# Patient Record
Sex: Female | Born: 1935 | Race: White | Hispanic: No | Smoking: Former smoker
Health system: Southern US, Community
[De-identification: ages and names within clinical notes are randomized; demographics above are authoritative.]

## PROBLEM LIST (undated history)

## (undated) DIAGNOSIS — K5792 Diverticulitis of intestine, part unspecified, without perforation or abscess without bleeding: Secondary | ICD-10-CM

## (undated) DIAGNOSIS — F32A Depression, unspecified: Secondary | ICD-10-CM

## (undated) DIAGNOSIS — K589 Irritable bowel syndrome without diarrhea: Secondary | ICD-10-CM

## (undated) DIAGNOSIS — K6389 Other specified diseases of intestine: Secondary | ICD-10-CM

## (undated) DIAGNOSIS — N83209 Unspecified ovarian cyst, unspecified side: Secondary | ICD-10-CM

## (undated) DIAGNOSIS — F419 Anxiety disorder, unspecified: Secondary | ICD-10-CM

## (undated) DIAGNOSIS — G4700 Insomnia, unspecified: Secondary | ICD-10-CM

## (undated) DIAGNOSIS — K449 Diaphragmatic hernia without obstruction or gangrene: Secondary | ICD-10-CM

## (undated) DIAGNOSIS — Z8673 Personal history of transient ischemic attack (TIA), and cerebral infarction without residual deficits: Secondary | ICD-10-CM

## (undated) DIAGNOSIS — C50919 Malignant neoplasm of unspecified site of unspecified female breast: Secondary | ICD-10-CM

## (undated) DIAGNOSIS — F329 Major depressive disorder, single episode, unspecified: Secondary | ICD-10-CM

## (undated) DIAGNOSIS — M21619 Bunion of unspecified foot: Secondary | ICD-10-CM

## (undated) DIAGNOSIS — I10 Essential (primary) hypertension: Secondary | ICD-10-CM

## (undated) DIAGNOSIS — K219 Gastro-esophageal reflux disease without esophagitis: Secondary | ICD-10-CM

## (undated) DIAGNOSIS — K635 Polyp of colon: Secondary | ICD-10-CM

## (undated) HISTORY — DX: Polyp of colon: K63.5

## (undated) HISTORY — PX: BUNIONECTOMY: SHX129

## (undated) HISTORY — DX: Depression, unspecified: F32.A

## (undated) HISTORY — PX: CATARACT EXTRACTION: SUR2

## (undated) HISTORY — DX: Irritable bowel syndrome, unspecified: K58.9

## (undated) HISTORY — DX: Anxiety disorder, unspecified: F41.9

## (undated) HISTORY — DX: Bunion of unspecified foot: M21.619

## (undated) HISTORY — DX: Gastro-esophageal reflux disease without esophagitis: K21.9

## (undated) HISTORY — DX: Insomnia, unspecified: G47.00

## (undated) HISTORY — DX: Diaphragmatic hernia without obstruction or gangrene: K44.9

## (undated) HISTORY — DX: Unspecified ovarian cyst, unspecified side: N83.209

## (undated) HISTORY — DX: Diverticulitis of intestine, part unspecified, without perforation or abscess without bleeding: K57.92

## (undated) HISTORY — PX: BREAST BIOPSY: SHX20

## (undated) HISTORY — DX: Essential (primary) hypertension: I10

## (undated) HISTORY — PX: ABDOMINAL HYSTERECTOMY: SHX81

## (undated) HISTORY — PX: TONSILLECTOMY: SUR1361

## (undated) HISTORY — DX: Malignant neoplasm of unspecified site of unspecified female breast: C50.919

## (undated) HISTORY — PX: KNEE ARTHROPLASTY: SHX992

## (undated) HISTORY — PX: HIP ARTHROPLASTY: SHX981

## (undated) HISTORY — DX: Other specified diseases of intestine: K63.89

## (undated) HISTORY — DX: Major depressive disorder, single episode, unspecified: F32.9

---

## 1995-06-21 HISTORY — PX: MASTECTOMY: SHX3

## 1996-11-14 ENCOUNTER — Encounter: Payer: Self-pay | Admitting: Gastroenterology

## 1998-04-10 ENCOUNTER — Other Ambulatory Visit: Admission: RE | Admit: 1998-04-10 | Discharge: 1998-04-10 | Payer: Self-pay | Admitting: *Deleted

## 1998-05-12 ENCOUNTER — Ambulatory Visit (HOSPITAL_COMMUNITY): Admission: RE | Admit: 1998-05-12 | Discharge: 1998-05-12 | Payer: Self-pay | Admitting: *Deleted

## 1999-04-13 ENCOUNTER — Other Ambulatory Visit: Admission: RE | Admit: 1999-04-13 | Discharge: 1999-04-13 | Payer: Self-pay | Admitting: *Deleted

## 1999-05-17 ENCOUNTER — Ambulatory Visit (HOSPITAL_COMMUNITY): Admission: RE | Admit: 1999-05-17 | Discharge: 1999-05-17 | Payer: Self-pay | Admitting: *Deleted

## 1999-05-17 ENCOUNTER — Encounter: Payer: Self-pay | Admitting: *Deleted

## 2000-04-17 ENCOUNTER — Other Ambulatory Visit: Admission: RE | Admit: 2000-04-17 | Discharge: 2000-04-17 | Payer: Self-pay | Admitting: *Deleted

## 2000-05-17 ENCOUNTER — Encounter: Payer: Self-pay | Admitting: *Deleted

## 2000-05-17 ENCOUNTER — Ambulatory Visit (HOSPITAL_COMMUNITY): Admission: RE | Admit: 2000-05-17 | Discharge: 2000-05-17 | Payer: Self-pay | Admitting: *Deleted

## 2000-07-25 ENCOUNTER — Encounter: Payer: Self-pay | Admitting: Emergency Medicine

## 2000-07-25 ENCOUNTER — Inpatient Hospital Stay (HOSPITAL_COMMUNITY): Admission: EM | Admit: 2000-07-25 | Discharge: 2000-07-27 | Payer: Self-pay | Admitting: Emergency Medicine

## 2000-07-26 ENCOUNTER — Encounter: Payer: Self-pay | Admitting: Neurology

## 2001-04-18 ENCOUNTER — Other Ambulatory Visit: Admission: RE | Admit: 2001-04-18 | Discharge: 2001-04-18 | Payer: Self-pay | Admitting: *Deleted

## 2001-04-19 ENCOUNTER — Encounter: Admission: RE | Admit: 2001-04-19 | Discharge: 2001-04-19 | Payer: Self-pay | Admitting: Gastroenterology

## 2001-04-19 ENCOUNTER — Encounter: Payer: Self-pay | Admitting: Gastroenterology

## 2001-05-01 ENCOUNTER — Ambulatory Visit (HOSPITAL_COMMUNITY): Admission: RE | Admit: 2001-05-01 | Discharge: 2001-05-01 | Payer: Self-pay | Admitting: Gastroenterology

## 2001-05-01 ENCOUNTER — Encounter (INDEPENDENT_AMBULATORY_CARE_PROVIDER_SITE_OTHER): Payer: Self-pay | Admitting: Specialist

## 2001-05-01 ENCOUNTER — Encounter: Payer: Self-pay | Admitting: Gastroenterology

## 2001-05-21 ENCOUNTER — Encounter: Payer: Self-pay | Admitting: Gastroenterology

## 2001-05-21 ENCOUNTER — Ambulatory Visit (HOSPITAL_COMMUNITY): Admission: RE | Admit: 2001-05-21 | Discharge: 2001-05-21 | Payer: Self-pay | Admitting: Gastroenterology

## 2001-05-31 ENCOUNTER — Ambulatory Visit (HOSPITAL_COMMUNITY): Admission: RE | Admit: 2001-05-31 | Discharge: 2001-05-31 | Payer: Self-pay | Admitting: Internal Medicine

## 2001-05-31 ENCOUNTER — Encounter: Payer: Self-pay | Admitting: Obstetrics and Gynecology

## 2002-06-03 ENCOUNTER — Ambulatory Visit (HOSPITAL_COMMUNITY): Admission: RE | Admit: 2002-06-03 | Discharge: 2002-06-03 | Payer: Self-pay | Admitting: *Deleted

## 2002-10-08 ENCOUNTER — Encounter: Admission: RE | Admit: 2002-10-08 | Discharge: 2002-10-08 | Payer: Self-pay | Admitting: Urology

## 2002-10-08 ENCOUNTER — Encounter: Payer: Self-pay | Admitting: Urology

## 2002-10-09 ENCOUNTER — Ambulatory Visit (HOSPITAL_BASED_OUTPATIENT_CLINIC_OR_DEPARTMENT_OTHER): Admission: RE | Admit: 2002-10-09 | Discharge: 2002-10-09 | Payer: Self-pay | Admitting: Urology

## 2003-04-01 ENCOUNTER — Ambulatory Visit (HOSPITAL_COMMUNITY): Admission: RE | Admit: 2003-04-01 | Discharge: 2003-04-01 | Payer: Self-pay | Admitting: Internal Medicine

## 2003-04-01 ENCOUNTER — Encounter: Payer: Self-pay | Admitting: Internal Medicine

## 2003-04-02 ENCOUNTER — Encounter (INDEPENDENT_AMBULATORY_CARE_PROVIDER_SITE_OTHER): Payer: Self-pay | Admitting: Specialist

## 2003-04-02 ENCOUNTER — Ambulatory Visit (HOSPITAL_BASED_OUTPATIENT_CLINIC_OR_DEPARTMENT_OTHER): Admission: RE | Admit: 2003-04-02 | Discharge: 2003-04-02 | Payer: Self-pay | Admitting: Orthopedic Surgery

## 2003-04-02 ENCOUNTER — Ambulatory Visit (HOSPITAL_COMMUNITY): Admission: RE | Admit: 2003-04-02 | Discharge: 2003-04-02 | Payer: Self-pay | Admitting: Orthopedic Surgery

## 2003-06-05 ENCOUNTER — Ambulatory Visit (HOSPITAL_COMMUNITY): Admission: RE | Admit: 2003-06-05 | Discharge: 2003-06-05 | Payer: Self-pay | Admitting: *Deleted

## 2003-06-10 ENCOUNTER — Other Ambulatory Visit: Admission: RE | Admit: 2003-06-10 | Discharge: 2003-06-10 | Payer: Self-pay | Admitting: *Deleted

## 2003-07-29 ENCOUNTER — Ambulatory Visit (HOSPITAL_COMMUNITY): Admission: RE | Admit: 2003-07-29 | Discharge: 2003-07-29 | Payer: Self-pay | Admitting: Internal Medicine

## 2003-11-19 ENCOUNTER — Ambulatory Visit (HOSPITAL_COMMUNITY): Admission: RE | Admit: 2003-11-19 | Discharge: 2003-11-19 | Payer: Self-pay | Admitting: Internal Medicine

## 2004-06-07 ENCOUNTER — Ambulatory Visit (HOSPITAL_COMMUNITY): Admission: RE | Admit: 2004-06-07 | Discharge: 2004-06-07 | Payer: Self-pay | Admitting: General Surgery

## 2004-06-15 ENCOUNTER — Encounter: Admission: RE | Admit: 2004-06-15 | Discharge: 2004-06-15 | Payer: Self-pay | Admitting: Orthopedic Surgery

## 2004-06-30 ENCOUNTER — Ambulatory Visit (HOSPITAL_BASED_OUTPATIENT_CLINIC_OR_DEPARTMENT_OTHER): Admission: RE | Admit: 2004-06-30 | Discharge: 2004-06-30 | Payer: Self-pay | Admitting: Orthopedic Surgery

## 2004-12-27 ENCOUNTER — Emergency Department (HOSPITAL_COMMUNITY): Admission: EM | Admit: 2004-12-27 | Discharge: 2004-12-28 | Payer: Self-pay | Admitting: Emergency Medicine

## 2005-01-31 ENCOUNTER — Encounter: Admission: RE | Admit: 2005-01-31 | Discharge: 2005-01-31 | Payer: Self-pay | Admitting: Neurology

## 2005-01-31 ENCOUNTER — Ambulatory Visit (HOSPITAL_COMMUNITY): Admission: RE | Admit: 2005-01-31 | Discharge: 2005-01-31 | Payer: Self-pay | Admitting: Neurology

## 2005-06-08 ENCOUNTER — Ambulatory Visit (HOSPITAL_COMMUNITY): Admission: RE | Admit: 2005-06-08 | Discharge: 2005-06-08 | Payer: Self-pay | Admitting: *Deleted

## 2006-04-25 ENCOUNTER — Ambulatory Visit: Payer: Self-pay | Admitting: Gastroenterology

## 2006-05-02 ENCOUNTER — Encounter: Admission: RE | Admit: 2006-05-02 | Discharge: 2006-05-02 | Payer: Self-pay | Admitting: Gastroenterology

## 2006-05-02 ENCOUNTER — Encounter: Payer: Self-pay | Admitting: Gastroenterology

## 2006-05-15 ENCOUNTER — Ambulatory Visit: Payer: Self-pay | Admitting: Gastroenterology

## 2006-05-22 ENCOUNTER — Ambulatory Visit: Payer: Self-pay | Admitting: Gastroenterology

## 2006-05-22 ENCOUNTER — Encounter (INDEPENDENT_AMBULATORY_CARE_PROVIDER_SITE_OTHER): Payer: Self-pay | Admitting: Specialist

## 2006-06-09 ENCOUNTER — Ambulatory Visit (HOSPITAL_COMMUNITY): Admission: RE | Admit: 2006-06-09 | Discharge: 2006-06-09 | Payer: Self-pay | Admitting: *Deleted

## 2007-05-07 ENCOUNTER — Encounter: Admission: RE | Admit: 2007-05-07 | Discharge: 2007-05-07 | Payer: Self-pay | Admitting: Obstetrics and Gynecology

## 2007-05-07 ENCOUNTER — Encounter: Admission: RE | Admit: 2007-05-07 | Discharge: 2007-05-07 | Payer: Self-pay | Admitting: Plastic Surgery

## 2007-05-09 ENCOUNTER — Encounter: Admission: RE | Admit: 2007-05-09 | Discharge: 2007-05-09 | Payer: Self-pay | Admitting: Obstetrics and Gynecology

## 2007-05-09 ENCOUNTER — Encounter (INDEPENDENT_AMBULATORY_CARE_PROVIDER_SITE_OTHER): Payer: Self-pay | Admitting: Diagnostic Radiology

## 2007-05-23 ENCOUNTER — Ambulatory Visit: Payer: Self-pay | Admitting: Oncology

## 2007-05-28 LAB — COMPREHENSIVE METABOLIC PANEL
Albumin: 3.8 g/dL (ref 3.5–5.2)
BUN: 26 mg/dL — ABNORMAL HIGH (ref 6–23)
CO2: 26 mEq/L (ref 19–32)
Calcium: 9.2 mg/dL (ref 8.4–10.5)
Chloride: 104 mEq/L (ref 96–112)
Creatinine, Ser: 0.83 mg/dL (ref 0.40–1.20)
Potassium: 4.2 mEq/L (ref 3.5–5.3)

## 2007-05-28 LAB — LACTATE DEHYDROGENASE: LDH: 190 U/L (ref 94–250)

## 2007-05-28 LAB — CBC WITH DIFFERENTIAL/PLATELET
Basophils Absolute: 0 10*3/uL (ref 0.0–0.1)
Eosinophils Absolute: 0.3 10*3/uL (ref 0.0–0.5)
HCT: 40.1 % (ref 34.8–46.6)
HGB: 14.2 g/dL (ref 11.6–15.9)
MONO#: 0.5 10*3/uL (ref 0.1–0.9)
NEUT#: 2.3 10*3/uL (ref 1.5–6.5)
NEUT%: 50.3 % (ref 39.6–76.8)
RDW: 12.4 % (ref 11.3–14.5)
WBC: 4.6 10*3/uL (ref 3.9–10.0)
lymph#: 1.5 10*3/uL (ref 0.9–3.3)

## 2007-05-31 ENCOUNTER — Encounter: Admission: RE | Admit: 2007-05-31 | Discharge: 2007-05-31 | Payer: Self-pay | Admitting: Oncology

## 2007-05-31 LAB — VITAMIN D PNL(25-HYDRXY+1,25-DIHY)-BLD
Vit D, 1,25-Dihydroxy: 56 pg/mL (ref 6–62)
Vit D, 25-Hydroxy: 58 ng/mL (ref 30–89)

## 2007-06-06 LAB — CBC WITH DIFFERENTIAL/PLATELET
BASO%: 0.4 % (ref 0.0–2.0)
Basophils Absolute: 0 10*3/uL (ref 0.0–0.1)
EOS%: 4.7 % (ref 0.0–7.0)
HCT: 40.5 % (ref 34.8–46.6)
HGB: 14.4 g/dL (ref 11.6–15.9)
MCH: 30.8 pg (ref 26.0–34.0)
MCHC: 35.4 g/dL (ref 32.0–36.0)
MCV: 87.1 fL (ref 81.0–101.0)
MONO%: 11.5 % (ref 0.0–13.0)
NEUT%: 57.2 % (ref 39.6–76.8)
RDW: 12.4 % (ref 11.3–14.5)
lymph#: 1.3 10*3/uL (ref 0.9–3.3)

## 2007-06-06 LAB — COMPREHENSIVE METABOLIC PANEL
BUN: 24 mg/dL — ABNORMAL HIGH (ref 6–23)
CO2: 29 mEq/L (ref 19–32)
Calcium: 9.4 mg/dL (ref 8.4–10.5)
Chloride: 102 mEq/L (ref 96–112)
Creatinine, Ser: 0.91 mg/dL (ref 0.40–1.20)

## 2007-06-06 LAB — LACTATE DEHYDROGENASE: LDH: 176 U/L (ref 94–250)

## 2007-06-10 LAB — VITAMIN D PNL(25-HYDRXY+1,25-DIHY)-BLD
Vit D, 1,25-Dihydroxy: 45 pg/mL (ref 6–62)
Vit D, 25-Hydroxy: 58 ng/mL (ref 30–89)

## 2007-06-11 ENCOUNTER — Ambulatory Visit: Payer: Self-pay | Admitting: Gastroenterology

## 2007-06-29 ENCOUNTER — Ambulatory Visit: Admission: RE | Admit: 2007-06-29 | Discharge: 2007-09-20 | Payer: Self-pay | Admitting: Radiation Oncology

## 2007-07-10 ENCOUNTER — Ambulatory Visit (HOSPITAL_COMMUNITY): Admission: RE | Admit: 2007-07-10 | Discharge: 2007-07-11 | Payer: Self-pay | Admitting: Otolaryngology

## 2007-07-10 ENCOUNTER — Encounter (INDEPENDENT_AMBULATORY_CARE_PROVIDER_SITE_OTHER): Payer: Self-pay | Admitting: General Surgery

## 2007-07-19 ENCOUNTER — Ambulatory Visit: Payer: Self-pay | Admitting: Oncology

## 2007-09-13 ENCOUNTER — Ambulatory Visit: Payer: Self-pay | Admitting: Oncology

## 2007-09-20 ENCOUNTER — Ambulatory Visit: Admission: RE | Admit: 2007-09-20 | Discharge: 2007-10-09 | Payer: Self-pay | Admitting: Radiation Oncology

## 2007-10-05 ENCOUNTER — Ambulatory Visit (HOSPITAL_COMMUNITY): Admission: RE | Admit: 2007-10-05 | Discharge: 2007-10-05 | Payer: Self-pay | Admitting: Plastic Surgery

## 2007-11-15 ENCOUNTER — Ambulatory Visit: Payer: Self-pay | Admitting: Oncology

## 2007-12-27 ENCOUNTER — Ambulatory Visit: Payer: Self-pay | Admitting: Oncology

## 2007-12-31 ENCOUNTER — Encounter: Payer: Self-pay | Admitting: Gastroenterology

## 2008-04-03 ENCOUNTER — Ambulatory Visit: Payer: Self-pay | Admitting: Oncology

## 2008-05-12 ENCOUNTER — Encounter: Admission: RE | Admit: 2008-05-12 | Discharge: 2008-05-12 | Payer: Self-pay | Admitting: Obstetrics and Gynecology

## 2008-05-20 ENCOUNTER — Ambulatory Visit: Payer: Self-pay | Admitting: Oncology

## 2008-05-22 LAB — COMPREHENSIVE METABOLIC PANEL
Albumin: 3.3 g/dL — ABNORMAL LOW (ref 3.5–5.2)
Alkaline Phosphatase: 64 U/L (ref 39–117)
BUN: 21 mg/dL (ref 6–23)
Calcium: 9.1 mg/dL (ref 8.4–10.5)
Chloride: 107 mEq/L (ref 96–112)
Glucose, Bld: 75 mg/dL (ref 70–99)
Potassium: 4.1 mEq/L (ref 3.5–5.3)

## 2008-05-22 LAB — CBC WITH DIFFERENTIAL/PLATELET
Basophils Absolute: 0 10*3/uL (ref 0.0–0.1)
EOS%: 1.9 % (ref 0.0–7.0)
HCT: 41.8 % (ref 34.8–46.6)
HGB: 14.2 g/dL (ref 11.6–15.9)
MCH: 29.9 pg (ref 26.0–34.0)
NEUT%: 75.2 % (ref 39.6–76.8)
lymph#: 0.8 10*3/uL — ABNORMAL LOW (ref 0.9–3.3)

## 2008-06-10 ENCOUNTER — Emergency Department (HOSPITAL_COMMUNITY): Admission: EM | Admit: 2008-06-10 | Discharge: 2008-06-10 | Payer: Self-pay | Admitting: Emergency Medicine

## 2008-06-25 ENCOUNTER — Ambulatory Visit (HOSPITAL_COMMUNITY): Admission: RE | Admit: 2008-06-25 | Discharge: 2008-06-25 | Payer: Self-pay | Admitting: Internal Medicine

## 2008-07-21 ENCOUNTER — Encounter: Payer: Self-pay | Admitting: Gastroenterology

## 2008-09-26 ENCOUNTER — Ambulatory Visit: Payer: Self-pay | Admitting: Oncology

## 2008-09-30 LAB — RESEARCH LABS

## 2009-03-27 ENCOUNTER — Ambulatory Visit: Payer: Self-pay | Admitting: Oncology

## 2009-03-31 ENCOUNTER — Encounter: Payer: Self-pay | Admitting: Gastroenterology

## 2009-05-18 ENCOUNTER — Encounter: Admission: RE | Admit: 2009-05-18 | Discharge: 2009-05-18 | Payer: Self-pay | Admitting: Oncology

## 2009-05-19 ENCOUNTER — Encounter: Admission: RE | Admit: 2009-05-19 | Discharge: 2009-05-19 | Payer: Self-pay | Admitting: Oncology

## 2009-08-06 ENCOUNTER — Ambulatory Visit: Payer: Self-pay | Admitting: Gastroenterology

## 2009-08-06 DIAGNOSIS — K589 Irritable bowel syndrome without diarrhea: Secondary | ICD-10-CM

## 2009-08-06 DIAGNOSIS — K5730 Diverticulosis of large intestine without perforation or abscess without bleeding: Secondary | ICD-10-CM | POA: Insufficient documentation

## 2009-09-25 ENCOUNTER — Encounter: Admission: RE | Admit: 2009-09-25 | Discharge: 2009-09-25 | Payer: Self-pay | Admitting: Internal Medicine

## 2009-09-25 ENCOUNTER — Ambulatory Visit: Payer: Self-pay | Admitting: Oncology

## 2009-09-29 ENCOUNTER — Encounter: Payer: Self-pay | Admitting: Gastroenterology

## 2010-02-17 ENCOUNTER — Encounter: Payer: Self-pay | Admitting: Gastroenterology

## 2010-04-02 ENCOUNTER — Ambulatory Visit: Payer: Self-pay | Admitting: Oncology

## 2010-04-06 ENCOUNTER — Encounter: Payer: Self-pay | Admitting: Gastroenterology

## 2010-06-22 ENCOUNTER — Encounter
Admission: RE | Admit: 2010-06-22 | Discharge: 2010-06-22 | Payer: Self-pay | Source: Home / Self Care | Attending: Oncology | Admitting: Oncology

## 2010-07-11 ENCOUNTER — Encounter: Payer: Self-pay | Admitting: Oncology

## 2010-07-12 ENCOUNTER — Encounter
Admission: RE | Admit: 2010-07-12 | Discharge: 2010-07-12 | Payer: Self-pay | Source: Home / Self Care | Attending: Obstetrics and Gynecology | Admitting: Obstetrics and Gynecology

## 2010-07-20 ENCOUNTER — Other Ambulatory Visit: Payer: Self-pay | Admitting: Internal Medicine

## 2010-07-20 DIAGNOSIS — E042 Nontoxic multinodular goiter: Secondary | ICD-10-CM

## 2010-07-22 NOTE — Letter (Signed)
Summary: [facility]  [facility]   Imported By: Sherian Rein 05/07/2010 14:47:16  _____________________________________________________________________  External Attachment:    Type:   Image     Comment:   External Document

## 2010-07-22 NOTE — Letter (Signed)
Summary: [facility]   Imported By: Lennie Odor 02/25/2010 10:46:24  _____________________________________________________________________  External Attachment:    Type:   Image     Comment:   External Document

## 2010-07-22 NOTE — Procedures (Signed)
Summary: Colonoscopy and biopsy   Colonoscopy  Procedure date:  05/01/2001  Findings:      Results: Diverticulosis.       Location:  [facility]   Patient Name: Dawn Diaz, Dawn Diaz. MRN:  Procedure Procedures: Colonoscopy CPT: [phone].    with biopsy. CPT: Q5068410.  Personnel: Endoscopist: Venita Lick. Russella Dar, MD, Clementeen Graham.  Exam Location: Exam performed in Endoscopy Suite. Outpatient  Patient Consent: Procedure, Alternatives, Risks and Benefits discussed, consent obtained, from patient.  Indications Symptoms: Hematochezia. Abdominal pain / bloating.  History  Pre-Exam Physical: Performed May 01, 2001. Cardio-pulmonary exam, Rectal exam, HEENT exam , Abdominal exam, Extremity exam, Neurological exam, Mental status exam WNL.  Exam Exam: Extent of exam reached: Transverse Colon, extent intended: Cecum.  Exam incomplete due to unable to advance around sharp corner and likely adhesions. Patient position: from side to side. Colon retroflexion performed. ASA Classification: II. Tolerance: fair, exam compromised.  Monitoring: Pulse and BP monitoring, Oximetry used. Supplemental O2 given.  Colon Prep Used Golytely for colon prep. Prep results: good.  Sedation Meds: Patient assessed and found to be appropriate for moderate (conscious) sedation. Fentanyl 100 mcg. Versed 9 mg.  Findings NORMAL EXAM: Transverse Colon to Splenic Flexure.  NOT SEEN ON EXAM: Cecum to Transverse Colon.  - DIVERTICULOSIS: Descending Colon to Sigmoid Colon. Not bleeding. ICD9: Diverticulosis: 562.10.  DIVERTICULOSIS: Sigmoid Colon. Not bleeding. ICD9: Diverticulitis: 562.11. Comments: focal inflammatory process-biopsied.   Assessment  Diagnoses: 562.10: Diverticulosis.  562.11: Diverticulitis.   Events  Unplanned Interventions: No intervention was required.  Unplanned Events: There were no complications. Plans Medication Plan: Await pathology. Continue current medications. Antibiotics:  Cipro/Ciprofloxacin 500 BID, for 7 days.   Disposition: After procedure patient sent to recovery. After recovery patient sent home.  Scheduling/Referral: Office Visit, to [employer]. Russella Dar, MD, [facility], around May 31, 2001.  Barium Enema, around May 22, 2001.    This report was created from the original endoscopy report, which was reviewed and signed by the above listed endoscopist.    SP Surgical Pathology - STATUS: Final             By: Morrie Sheldon,       Perform Date: [phone] 09:56  Ordered By: Pleas Koch Md , Hinda Lenis Date:  Facility: [facility]                              Department: CPATH  Service Report Text  [facility]   [address] [place], [place] [address]   [phone]    REPORT OF SURGICAL PATHOLOGY    Case #: [id number]   Patient Name: Dawn, Diaz.   PID: [id number]   Pathologist: Beulah Gandy. Luisa Hart, MD   DOB/Age [birth year] (Age: 89) Gender: F   Date Taken: 05/01/2001   Date Received: 05/01/2001    FINAL DIAGNOSIS    ***MICROSCOPIC EXAMINATION AND DIAGNOSIS***    COLON, SIGMOID BIOPSIES: INFLAMED GRANULATION TISSUE AND   FRAGMENT OF COLONIC MUCOSA WITH MILD EDEMA. SEE COMMENT.    COMMENT   There is a colonic mucosal fragment with mild edema of the lamina   propria and a separate fragment of inflamed granulation tissue.   While the features are nonspecific, they are compatible with the   clinical impression of diverticulitis. No granulomas or   adenomatous change is identified. (JP:jy)05/02/01  jy   Date Reported: 05/02/2001 Beulah Gandy. Luisa Hart, MD   *** Electronically Signed Out By JDP ***    Clinical information   R/O diverticulitis. (tmc)    specimen(s) obtained   Sigmoid colon, biopsy    Gross Description   Received in formalin are tan, soft tissue fragments that are   submitted in toto. Number: 2   Size: each 0.1 cm. One block. (SW:caf 05/01/01)    cf/

## 2010-07-22 NOTE — Assessment & Plan Note (Signed)
Summary: IBS--[facility].   History of Present Illness Visit Type: Follow-up Visit Primary GI MD: Elie Goody MD [facility] Primary Provider: Creola Corn, MD  Requesting Provider: n/a Chief Complaint: Urgert BMs History of Present Illness:   Dawn Diaz complains of frequent, urgent bowel movements, and rare episodes of diarrhea. Her symptoms are generally brought on by meals. She has recently started taking Hyomax about 1 to 1-1/2 hour before meals which has substantially improved her postprandial urgency.   GI Review of Systems      Denies abdominal pain, acid reflux, belching, bloating, chest pain, dysphagia with liquids, dysphagia with solids, heartburn, loss of appetite, nausea, vomiting, vomiting blood, weight loss, and  weight gain.        Denies anal fissure, black tarry stools, change in bowel habit, constipation, diarrhea, diverticulosis, fecal incontinence, heme positive stool, hemorrhoids, irritable bowel syndrome, jaundice, light color stool, liver problems, rectal bleeding, and  rectal pain.   Current Medications (verified): 1)  Hyomax 0.125 Mg Tabs (Hyoscyamine Sulfate) .... One Tablet By Mouth Two Times A Day As Needed 2)  Imodium A-D 1 Mg/7.39ml Liqd (Loperamide Hcl) .... As Needed 3)  Cvs Daily Probiotic  Caps (Probiotic Product) .... One Tablet By Mouth Once Daily 4)  Metamucil 30.9 % Powd (Psyllium) .... Once Daily 5)  Fish Oil 1200 Mg Caps (Omega-3 Fatty Acids) .... One Tablet By Mouth Once Daily 6)  Biotin 1000 Mcg Tabs (Biotin) .... One Tablet By Mouth Once Daily 7)  Multivitamins  Tabs (Multiple Vitamin) .... One Tablet By Mouth Once Daily 8)  Tamoxifen Citrate 20 Mg Tabs (Tamoxifen Citrate) .... One Tablet By Mouth Two Times A Day 9)  Plaquenil 200 Mg Tabs (Hydroxychloroquine Sulfate) .... One Tablet By Mouth Two Times A Day 10)  Aspir-Low 81 Mg Tbec (Aspirin) .... One Tablet By Mouth Once Daily 11)  Curamin(Dosage Unknown) .... One Tablet By Mouth Two Times A  Day  Allergies (verified): 1)  ! Amoxicillin  Past History:  Past Medical History: Irritable Bowel Syndrome Diverticulosis Diverticulitis GERD Stage I Breast cancer Left ovarian cyst Bunions Osetoporosis Anxiety Disorder Depression Hiatal hernia  Past Surgical History: Reviewed history from 08/05/2009 and no changes required. Right Mastectomy 1997 Hysterectomy Tonsillectomy Benign breast biopsy  Family History: Reviewed history from 08/05/2009 and no changes required. Family History of Colon Cancer: Sister at age 43 and passed away at 77.  Social History: Occupation: Retired Married Patient is a former smoker.  Alcohol Use - no Illicit Drug Use - no Smoking Status:  quit Drug Use:  no  Review of Systems       The pertinent positives and negatives are noted as above and in the HPI. All other ROS were reviewed and were negative.   Vital Signs:  Patient profile:   77 year old female Height:      67 inches Weight:      138 pounds BMI:     21.69 BSA:     1.73 Pulse rate:   64 / minute Pulse rhythm:   regular BP sitting:   110 / 60  (left arm) Cuff size:   regular  Vitals Entered By: Ok Anis CMA (August 06, 2009 9:07 AM)  Physical Exam  General:  Well developed, well nourished, no acute distress. Head:  Normocephalic and atraumatic. Eyes:  PERRLA, no icterus. Ears:  Normal auditory acuity. Mouth:  No deformity or lesions, dentition normal. Lungs:  Clear throughout to auscultation. Heart:  Regular rate and rhythm; no murmurs,  rubs,  or bruits. Abdomen:  Soft, nontender and nondistended. No masses, hepatosplenomegaly or hernias noted. Normal bowel sounds. Psych:  Alert and cooperative. Normal mood and affect.  Impression & Recommendations:  Problem # 1:  IRRITABLE BOWEL SYNDROME (ICD-564.1) Continue Hyomax 1-2 before meals and q4 hours as needed. Imodium AD as needed.  Problem # 2:  FAMILY HX COLON CANCER (ICD-V16.0) Sister with colon cancer  at age 61. Screening colon examination due to December 2012. Due a tortuous colon and prior incomplete colonoscopies we will plan for a CT colonoscopy as we did her her last screening examination.  Problem # 3:  DIVERTICULOSIS-COLON (ICD-562.10) Long-term high fiber diet with fiber supplements and adequate daily water intake.  Patient Instructions: 1)  IBS brochure given.  2)  Please continue current medications.  3)  Copy sent to : Creola Corn, MD 4)  The medication list was reviewed and reconciled.  All changed / newly prescribed medications were explained.  A complete medication list was provided to the patient / caregiver.

## 2010-07-22 NOTE — Letter (Signed)
[facility]   Imported By: Sherian Rein 10/22/2009 11:34:57  _____________________________________________________________________  External Attachment:    Type:   Image     Comment:   External Document

## 2010-07-22 NOTE — Procedures (Signed)
Summary: Flexible Sigmoidoscopy and biopsy   Flexible Sigmoidoscopy  Procedure date:  05/22/2006  Findings:      Results: Diverticulosis. Quality of Study: Good.   Procedures Next Due Date:    Flexible Sigmoidoscopy: 05/2011  Flexible Sigmoidoscopy  Procedure date:  05/22/2006  Findings:      Results: Diverticulosis. Quality of Study: Good.   Procedures Next Due Date:    Flexible Sigmoidoscopy: 05/2011 Patient Name: Dawn Diaz, Dawn Diaz. MRN:  Procedure Procedures: Flexible Proctosigmoidoscopy CPT: Q632156.    with polypectomy. CPT: I988382.  Personnel: Endoscopist: Venita Lick. Russella Dar, MD, Clementeen Graham.  Exam Location: Exam performed in Outpatient Clinic. Outpatient  Patient Consent: Procedure, Alternatives, Risks and Benefits discussed, consent obtained, from patient.  Indications  Evaluation of: polyp noted on CT colography (virtual colonoscopy).  Increased Risk Screening: For family history of colorectal neoplasia.  History  Current Medications: Patient is not currently taking Coumadin.  Pre-Exam Physical: Performed May 22, 2006. Cardio-pulmonary exam  WNL. Rectal exam, HEENT exam , Abdominal exam, Mental status exam WNL.  Comments: Pt. history reviewed/updated, physical exam performed prior to sedation?Yes Exam Exam: Extent visualized: Descending Colon. Extent of exam: 45 cm. Colon retroflexion performed. ASA Classification: II.  Monitoring: Pulse and BP monitoring, Oximetry used. Supplemental O2 given.  Colon Prep Used Fleets enema for colon prep. Prep: good.  Sedation Meds: Patient assessed and found to be appropriate for moderate (conscious) sedation. Fentanyl 50 mcg. given IV. Versed 5 mg. given IV.  Findings - DIVERTICULOSIS: Descending Colon to Sigmoid Colon. Not bleeding. ICD9: Diverticulosis: 562.10.  POLYP: Rectum, Maximum size: 5 mm. sessile polyp. Procedure:  snare without cautery, removed, Polyp retrieved, sent to pathology. ICD9: Colon Polyps: 211.3.   NORMAL EXAM: Rectum.    Comments: VC noted a rectal polyp measuring 19mm x 13mm which was not present on Flex Sig. A 5 mm polyp was noted near the location of the polyp described on VC. Assessment  Diagnoses: 562.10: Diverticulosis.  211.3: Colon Polyps.   Events  Unplanned Intervention: No intervention was required.  Unplanned Events: There were no complications. Plans  Post Exam Instructions: Post sedation instructions given.  Medication Plan: Await pathology.  Patient Education: Patient given standard instructions for: Polyps. Diverticulosis.  Disposition: After procedure patient sent to recovery. After recovery patient sent home.  Scheduling: Colonoscopy vs VC, around May 23, 2011.    This report was created from the original endoscopy report, which was reviewed and signed by the above listed endoscopist.    cc: Creola Corn, MD          SP Surgical Pathology - STATUS: Final             By: Windy Kalata,      Perform Date: 3 Dec07 00:01  Ordered By: Rica Records Date: 4 Dec07 11:46  Facility: [facility]                               Department: CPATH  Service Report Text  [facility]   [address] [address]   [place], [place] [address]   Telephone [phone] or [phone] Fax [phone]    REPORT OF SURGICAL PATHOLOGY    Case #: [id number]   Patient Name: Dawn Diaz, Dawn Diaz.   Office Chart Number: [id number]    MRN: [id number]   Pathologist: Alden Server A. Delila Spence, MD   DOB/Age  Mar 11, 1927 (Age: 77) Gender: F   Date Taken: 05/22/2006   Date Received: 05/23/2006    FINAL DIAGNOSIS    ***MICROSCOPIC EXAMINATION AND DIAGNOSIS***    RECTUM, POLYP(S): HYPERPLASTIC POLYP(S). NO ADENOMATOUS CHANGES   OR EVIDENCE OF MALIGNANCY IDENTIFIED (BIOPSY).    jy   Date Reported: 05/24/2006 Lyn Hollingshead. Delila Spence, MD   *** Electronically Signed Out By EAA ***    Clinical information   Polyp noted on ventral. R/O  adenomatous (jy)    specimen(s) obtained   Rectum, polyp(s)    Gross Description   Received in formalin is a tan, soft tissue fragment that is   submitted in toto. Size: 0.5 cm (TB:kv 05-23-06)    kv/

## 2010-07-22 NOTE — Procedures (Signed)
Summary: EGD   EGD  Procedure date:  05/01/2001  Findings:      Hiatal hernia Location: [facility]    EGD  Procedure date:  05/01/2001  Findings:      Hiatal hernia Location: [facility]   Patient Name: Dawn Diaz, Dawn Diaz. MRN:  Procedure Procedures: Panendoscopy (EGD) CPT: 43235.  Personnel: Endoscopist: Venita Lick. Russella Dar, MD, Clementeen Graham.  Exam Location: Exam performed in Endoscopy Suite. Outpatient  Patient Consent: Procedure, Alternatives, Risks and Benefits discussed, consent obtained, from patient.  Indications Symptoms: Abdominal pain, location: LUQ.  History  Pre-Exam Physical: Performed May 01, 2001  Cardio-pulmonary exam, HEENT exam, Abdominal exam, Extremity exam, Neurological exam, Mental status exam WNL.  Exam Exam Info: Maximum depth of insertion Duodenum, intended Duodenum. Patient position: from side to side. Vocal cords not visualized. Gastric retroflexion performed. ASA Classification: II. Tolerance: excellent.  Sedation Meds: Patient assessed and found to be appropriate for moderate (conscious) sedation. Residual sedation present from prior procedure today. Cetacaine Spray 2 sprays  Monitoring: BP and pulse monitoring done. Oximetry used. Supplemental O2 given  Findings Normal: Proximal Esophagus to Distal Esophagus.  HIATAL HERNIA: Regular, 2 cms. in length. ICD9: Hernia, Hiatal: 553.3. Normal: Fundus to Duodenal 2nd Portion.   Assessment  Diagnoses: 553.3: Hernia, Hiatal.   Events  Unplanned Intervention: No unplanned interventions were required.  Unplanned Events: There were no complications. Plans Medication(s): Continue current medications.  Disposition: After procedure patient sent to recovery. After recovery patient sent home.  Scheduling: Office Visit, to [employer]. Russella Dar, MD, [facility], around May 31, 2001.    This report was created from the original endoscopy report, which was reviewed and signed by the  above listed endoscopist.

## 2010-08-03 ENCOUNTER — Other Ambulatory Visit: Payer: Self-pay | Admitting: Interventional Radiology

## 2010-08-03 ENCOUNTER — Ambulatory Visit
Admission: RE | Admit: 2010-08-03 | Discharge: 2010-08-03 | Disposition: A | Discharge status: Home / Self Care | Payer: Medicare Other | Source: Ambulatory Visit | Attending: Internal Medicine | Admitting: Internal Medicine

## 2010-08-03 ENCOUNTER — Other Ambulatory Visit (HOSPITAL_COMMUNITY)
Admission: RE | Admit: 2010-08-03 | Discharge: 2010-08-03 | Disposition: A | Discharge status: Home / Self Care | Payer: Medicare Other | Source: Ambulatory Visit | Attending: Interventional Radiology | Admitting: Interventional Radiology

## 2010-08-03 DIAGNOSIS — E042 Nontoxic multinodular goiter: Secondary | ICD-10-CM

## 2010-08-03 DIAGNOSIS — E049 Nontoxic goiter, unspecified: Secondary | ICD-10-CM | POA: Insufficient documentation

## 2010-09-10 ENCOUNTER — Other Ambulatory Visit (HOSPITAL_COMMUNITY): Payer: Self-pay | Admitting: Surgery

## 2010-09-10 DIAGNOSIS — C8590 Non-Hodgkin lymphoma, unspecified, unspecified site: Secondary | ICD-10-CM

## 2010-09-17 ENCOUNTER — Other Ambulatory Visit: Payer: Self-pay | Admitting: Surgery

## 2010-09-17 ENCOUNTER — Other Ambulatory Visit (HOSPITAL_COMMUNITY): Payer: Self-pay | Admitting: Surgery

## 2010-09-17 ENCOUNTER — Ambulatory Visit (HOSPITAL_COMMUNITY)
Admission: RE | Admit: 2010-09-17 | Discharge: 2010-09-17 | Disposition: A | Discharge status: Home / Self Care | Payer: Medicare Other | Source: Ambulatory Visit | Attending: Surgery | Admitting: Surgery

## 2010-09-17 ENCOUNTER — Ambulatory Visit (HOSPITAL_COMMUNITY): Payer: Medicare Other

## 2010-09-17 ENCOUNTER — Other Ambulatory Visit: Payer: Self-pay | Admitting: Interventional Radiology

## 2010-09-17 DIAGNOSIS — E063 Autoimmune thyroiditis: Secondary | ICD-10-CM | POA: Insufficient documentation

## 2010-09-17 DIAGNOSIS — C8590 Non-Hodgkin lymphoma, unspecified, unspecified site: Secondary | ICD-10-CM

## 2010-09-17 DIAGNOSIS — K589 Irritable bowel syndrome without diarrhea: Secondary | ICD-10-CM | POA: Insufficient documentation

## 2010-09-17 DIAGNOSIS — H409 Unspecified glaucoma: Secondary | ICD-10-CM | POA: Insufficient documentation

## 2010-09-17 DIAGNOSIS — F341 Dysthymic disorder: Secondary | ICD-10-CM | POA: Insufficient documentation

## 2010-09-17 DIAGNOSIS — C50919 Malignant neoplasm of unspecified site of unspecified female breast: Secondary | ICD-10-CM | POA: Insufficient documentation

## 2010-09-17 DIAGNOSIS — M1990 Unspecified osteoarthritis, unspecified site: Secondary | ICD-10-CM | POA: Insufficient documentation

## 2010-09-17 LAB — CBC
Platelets: 201 10*3/uL (ref 150–400)
RBC: 4.68 MIL/uL (ref 3.87–5.11)
WBC: 4.7 10*3/uL (ref 4.0–10.5)

## 2010-10-05 ENCOUNTER — Encounter (HOSPITAL_BASED_OUTPATIENT_CLINIC_OR_DEPARTMENT_OTHER): Payer: Medicare Other | Admitting: Oncology

## 2010-10-05 DIAGNOSIS — M129 Arthropathy, unspecified: Secondary | ICD-10-CM

## 2010-10-05 DIAGNOSIS — J984 Other disorders of lung: Secondary | ICD-10-CM

## 2010-10-05 DIAGNOSIS — C50919 Malignant neoplasm of unspecified site of unspecified female breast: Secondary | ICD-10-CM

## 2010-11-02 NOTE — Op Note (Signed)
NAME:  Dawn Diaz, Dawn Diaz                ACCOUNT NO.:  [id number]   MEDICAL RECORD NO.:  [id number]          PATIENT TYPE:  REC   LOCATION:  [facility]                         FACILITY:  [facility]   PHYSICIAN:  Etter Sjogren, M.D.     DATE OF BIRTH:  Oct 31, 1926   DATE OF PROCEDURE:  07/10/2007  DATE OF DISCHARGE:                               OPERATIVE REPORT   PREOPERATIVE DIAGNOSES:  1. Recurrent right breast cancer.  2. Chin lesion 1 cm in diameter.   POSTOPERATIVE DIAGNOSES:  1. Recurrent right breast cancer.  2. Chin lesion 1 cm in diameter.   PROCEDURES:  1. Total capsulectomy, right chest, with removal of implant.  2. Removal of chin lesion, 1 cm, with complex wound closure of 2.5 cm.   SURGEON:  Dr. Odis Luster.   ANESTHESIA:  General.   BLOOD LOSS:  Minimal.   CLINICAL NOTE:  A 77 year old woman that has a recurrent right breast  cancer.  She had mastectomy and reconstruction with tissue expansion  followed by implant.  She has the nodule in the central aspect of the  breast mound just below the mastectomy scar and examination reveals  breast cancer.  Needle biopsies were positive.  She presents for wide  excision.  She will also have a planned radiation treatment and it is  felt that her implant will easily be removed.  Because of the depth of  the lesion in these fairly thin tissues, it was felt that it was a  possibility that the capsule might be involved and it might be a  positive margin there, certainly very close, and a total capsulectomy  was indicated at this time.  The procedure was discussed with the  patient.  She understood this and did wish to proceed.  In addition, she  had a lesion on her chin that has grown and is pigmented and she desired  excision of that as well.  The nature of the procedure, risks as well as  complications were discussed with her in great detail, including the  possibility of recurrent breast cancer and she understood all this and  wished to  proceed.   PROCEDURE IN DETAIL:  The patient was taken to the operating room.  Dr.  Avel Peace performed with a wide excision, after this was marked;  this was through the muscle into the capsule.  The implant was removed.  The total capsulectomy was performed using electrocautery, retracting  the muscle from the capsule.  The posterior chest wall, great care was  taken to avoid damage underlying chest cavity.  We achieved hemostasis  with electrocautery.  A total capsulectomy was performed.  After  thorough irrigation with saline, excellent hemostasis was achieved using  electrocautery.  Blake drains were positioned and brought through  separate stab wounds inferolaterally and secured with 3-0 Prolene suture  and the On-Q pain pump was also positioned, one catheter for that.  Closure was performed with 2-0 Monocryl interrupted inverted deep  sutures for the muscle layer, followed by 3-0 Monocryl inverted deep  dermal sutures, followed by running 4-0 Monocryl subcuticular suture.  Steri-Strips were applied and then a dry sterile dressing and a sterile  towel.   Attention was then directed to the chin lesion.  She was prepped with  Betadine and draped with sterile drapes.  She was given 1% Xylocaine  with epinephrine local.  An elliptical incision was designed as had been  marked with the patient opening her mouth and pursing her lips in order  to try and achieve a favorable orientation for the scar with respect to  those movements.  The excision was performed in an a left oblique  direction with a gentle S-type of configuration to this excision with  thorough irrigation with saline and the closure with 4-0 Monocryl  interrupted deep sutures, 4-0 Monocryl interrupted deep dermal sutures,  and a running 6-0 Prolene simple suture.  Antibiotic dressing was  applied.  A. 6-inch Ace wrap was then applied to the chest for gentle  pressure and to hold the dressings in place.  She was  transferred to the  recovery room in stable condition having tolerated the procedure well.   DISPOSITION:  She will be observed overnight in the hospital.      Etter Sjogren, M.D.  Electronically Signed     DB/MEDQ  D:  07/10/2007  T:  07/10/2007  Job:  [id number]   cc:   Etter Sjogren, M.D.

## 2010-11-02 NOTE — Assessment & Plan Note (Signed)
[facility]                         GASTROENTEROLOGY OFFICE NOTE   NAME:Dawn Diaz, Dawn Diaz                       MRN:          [id number]  DATE:06/11/2007                            DOB:          1926-07-27    HISTORY OF PRESENT ILLNESS:  Dawn Diaz returns for follow up today for  irritable bowel syndrome.  Her symptoms remain under good control with  the use of Levsin on a daily basis.  She relates that she has had  recurrent breast cancer in her right breast, and she will undergo  further treatment for this early in 2009. She has no new colorectal  complaints.  Specifically denies any change in bowel habits, melena,  hematochezia or change in stool caliber.  Her medications listed on the  chart are being reviewed.   ALLERGIES:  AMOXICILLIN LEADING TO HIVES.   PHYSICAL EXAMINATION:  GENERAL:  No acute distress.  VITAL SIGNS:  Weight 142.4 pounds, blood pressure 122/70, pulse 80 and  regular.  CHEST:  Clear to auscultation bilaterally.  CARDIAC:  Regular rate and rhythm without murmurs.  ABDOMEN:  Soft, nontender, nondistended with normoactive bowel sounds.  No palpable organomegaly, masses or hernias.   ASSESSMENT/PLAN:  1. Irritable bowel syndrome and diverticulosis.  Maintain a long-term      high fiber diet with adequate fluid intake.  Renew Levsin.  Plan      for return office visit in one year.  2. Sister with colon cancer at age 54.  Plan for colonoscopy or a CT      colonoscopy in December 2012.     Venita Lick. Russella Dar, MD, [facility]  Electronically Signed    MTS/MedQ  DD: 06/21/2007  DT: 06/21/2007  Job #: [id number]   cc:   Gwen Pounds, MD

## 2010-11-02 NOTE — Op Note (Signed)
NAME:  Dawn Diaz, Dawn Diaz                ACCOUNT NO.:  [id number]   MEDICAL RECORD NO.:  [id number]          PATIENT TYPE:  REC   LOCATION:  [facility]                         FACILITY:  [facility]   PHYSICIAN:  Adolph Pollack, M.D.DATE OF BIRTH:  05-Jul-1926   DATE OF PROCEDURE:  07/10/2007  DATE OF DISCHARGE:                               OPERATIVE REPORT   PREOPERATIVE DIAGNOSIS:  Recurrent right breast cancer to the right  chest wall.   POSTOPERATIVE DIAGNOSIS:  Recurrent right breast cancer to the right  chest wall.   PROCEDURE:  Wide excision of recurrent right breast cancer to the chest  wall.   SURGEON:  Adolph Pollack, M.D.   ASSISTANT:  Etter Sjogren, M.D.   ANESTHESIA:  General.   INDICATIONS:  Ms. Araque is a 77 year old female who had a mastectomy and  reconstruction with an implant for DCIS.  She noted a nodule just  inferior to her old incision and was biopsied and positive for invasive  cancer.  She now presents for excision as well as capsulectomy and  removal of the implant by Dr. Odis Luster.   TECHNIQUE:  She is seen in the holding area and the right chest marked  with my initials.  She was then brought to the operating room, placed  supine on the operating table, and general anesthetic was administered.  Dr. Odis Luster then marked the site of resection for me to allow plenty of  skin and subcutaneous tissue.  I then made an elliptical incision around  the mass with adequate margins, first using the knife then using  electrocautery to go down to the capsule.  I then excised the mass and  breast tissue down to the capsule and sent it off to pathology.  Bleeding was controlled with electrocautery.  Dr. Odis Luster was now going  to follow with the capsulectomy and removal of the implant.  She  tolerated my procedure well without apparent complications.      Adolph Pollack, M.D.  Electronically Signed     TJR/MEDQ  D:  07/26/2007  T:  07/27/2007  Job:  [id number]

## 2010-11-05 NOTE — Procedures (Signed)
This a routine EEG in a patient who is on __________, Fosamax, Topamax and  baby aspirin. This 77 year old right-handed white female has a history of  transient global amnesia and a possible history of migraine.   TECHNICAL DESCRIPTION:  This EEG was recorded during the awake state and  shows a background rhythm of 12-16 Hz rhythms, higher amplitude seen in the  posterior head regions bilaterally. There is some voltage fast beta activity  more promptly present in the anterior head and central regions. No evidence  of any focal asymmetry is present during this study. Hyperventilation  testing did not produce any abnormalities. Photic stimulation did not  produce any evidence of a driving response. The background activity was  interrupted by eye blinking artifact seen during the study.   IMPRESSION:  Normal EEG during the awake state. Drowsiness is present but  there is no evidence of any stage II sleep.       ZOX:WRUE  D:  01/31/2005 11:18:25  T:  01/31/2005 13:46:56  Job #:  [id number]

## 2010-11-05 NOTE — Op Note (Signed)
NAME:  Dawn Diaz, Dawn Diaz                          ACCOUNT NO.:  [id number]   MEDICAL RECORD NO.:  [id number]                   PATIENT TYPE:  AMB   LOCATION:  [facility]                                 FACILITY:  [facility]   PHYSICIAN:  Rozanna Boer., M.D.      DATE OF BIRTH:  1927-06-06   DATE OF PROCEDURE:  10/09/2002  DATE OF DISCHARGE:                                 OPERATIVE REPORT   PREOPERATIVE DIAGNOSES:  Stress urinary incontinence.   POSTOPERATIVE DIAGNOSES:  Stress urinary incontinence.   OPERATION:  SPARC urethral sling procedure.   ANESTHESIA:  General.   SURGEON:  Courtney Paris, M.D.   BRIEF HISTORY:  This 77 year old white female was admitted with stress  urinary incontinence for years, it is time for surgical correction. She has  not responded to medication, biofeedback and Kegel's and understands the  risks involved including but not limited to bleeding, pain, erosion of the  urethra, the need to require intermittent catheterization and failure to  correct the incontinence.   DESCRIPTION OF PROCEDURE:  The patient was placed on the operating table in  the dorsal lithotomy position after satisfactory induction of general  anesthesia. She was prepped and draped with Betadine in the usual sterile  fashion. A Foley catheter was inserted and her bladder was drained, a small  residual was obtained. 20 mL of 0.5% Marcaine mixed in sterile saline 60 mL  was then injected on either side in the suprapubic space 1 1/2  fingerbreadths above the symphysis pubis. This was to hydrodissect the  bladder off the pubis and the space of Retzius. Then about 5 mL of 1%  Xylocaine with 1:1000 epinephrine was then injected in the mid urethra,  evidenced by the Foley catheter which I could palpate the bladder neck. This  was done transversely and laterally on either side of the vagina. Then a  transverse incision was made across the mid urethra and using Strulle  scissors, I  dissected the vaginal flap off the mid urethra and back to the  endopelvic fascia on either side to so that it would accept the bulb sucker  tip. Hemostasis was achieved by electrocoagulation and then I made two small  stab wounds, 1 1/2 fingerbreadths above the symphysis pubis on either side  of the midline and passed the [facility] needles on either side hugging the  symphysis pubis as they came into the previously created space as it popped  through the endopelvic fascia on either side of the mid urethra. The  catheter was then removed and with the cystoscope with the 70 degree lens,  the bladder was filled and the needles were seen to not have penetrated the  bladder lumen. The bladder was left full, scope was removed and the [facility]  polypropylene tape was then attached to the needles and pulled up into the  suprapubic space. A hemostat was placed on the mid part of the sling and  then with very careful positioning of the sling with a Kelly clamp  underneath the urethra, I was able to remove the sheaths on either side  fixing the sling in place. It was still quite loose and with suprapubic  pressure she leaked easily. The ends of the sling were then cut down below  the skin edge in the suprapubic space and the wound was then irrigated with  orthopedic bug juice. Closed in 2 layers with #3 Chromic catgut for the  vaginal flap which affected good hemostasis. A vaginal packing was then  applied  and the bladder was then drained with the Foley. She was sent to recovery  room in good condition after placing Dermabond over the suprapubic site and  a sterile dry dressing. Estimated blood loss was minimal.  She tolerated the  procedure well. She will void before she leaves the recovery room.                                                Rozanna Boer., M.D.    HMK/MEDQ  D:  10/09/2002  T:  10/09/2002  Job:  [id number]

## 2010-11-05 NOTE — Op Note (Signed)
NAME:  Dawn Diaz, Dawn Diaz                          ACCOUNT NO.:  [id number]   MEDICAL RECORD NO.:  [id number]                   PATIENT TYPE:  AMB   LOCATION:  DSC                                  FACILITY:  [facility]   PHYSICIAN:  Cindee Salt, M.D.                    DATE OF BIRTH:  Dec 20, 1926   DATE OF PROCEDURE:  04/02/2003  DATE OF DISCHARGE:                                 OPERATIVE REPORT   PREOPERATIVE DIAGNOSIS:  Dupuytren's contracture ring and little finger  right hand.   POSTOPERATIVE DIAGNOSIS:  Dupuytren's contracture ring and little finger  right hand.   OPERATION:  Excision palmar fascia with BY advancements right ring and right  little finger.   SURGEON:  Cindee Salt, M.D.   ASSISTANTCarolyne Fiscal.   ANESTHESIA:  General anesthesia.   INDICATIONS FOR PROCEDURE:  The patient is a 77 year old female with a  history of Dupuytren's contracture cords to the ring and little finger with  a flexion deformity to both fingers at the metatarsal phalangeal joint  beginning on the PIP.   DESCRIPTION OF PROCEDURE:  The patient is brought to the operating room and  general anesthetic carried out without difficulty.  She was prepped and  draped using DuraPrep in the supine position with the right arm free.  A  volar Brunner incision was made primarily on the little finger, a second one  on the ring finger, carried down through subcutaneous tissue.  The incision  started over the carpal retinaculum.  Bleeders were electrocauterized, blunt  and sharp dissection palmar fascia was isolated.  This was transected across  the entire palm over the carpal retinaculum.  Dissection was then carried  distally.  Neurovascular bundles are identified and protected.  Dissection  was carried out to the middle phalanx of the little finger, protecting the  neurovascular bundles both radially and ulnarly.  This allowed complete  straightening of the finger.  The abductor digiti quinti had a small cord  present.  This was released and excised.  The separate incision was then  made over the ring finger.  This did not connect with the dissection on the  little finger.  A skin bridge left intact beneath the skin.  A cord to the  ring finger was elevated, protecting the neurovascular bundles along the  entire course.  This also inserted on the middle phalanx.  Specimens were  sent to pathology.  Bleeders were electrocauterized.  The wounds irrigated.  Vesseloop drain was placed primarily to the little finger, brought out  through the proximal wound.  This was double in nature.  The wound was then  loosely closed with interrupted 5-0 nylon sutures.  Sterile compressive  dressing was applied.  Tourniquet deflated.  Fingers immediately pink.  Splint was applied.  The patient was taken to the recovery room for  observation in stable condition.  She is  discharged home to return to the  [facility] in one week on Vicodin and Septra DS.                                               Cindee Salt, M.D.    GK/MEDQ  D:  04/02/2003  T:  04/02/2003  Job:  [id number]

## 2010-11-05 NOTE — Discharge Summary (Signed)
[facility]  Patient:    Dawn Diaz, Dawn Diaz                       MRN: [id number] Adm. Date:  [id number] Disc. Date: [id number] Attending:  Durel Salts CC:         Jonelle Sports. Cheryll Cockayne, M.D.   Discharge Summary  ADMISSION DIAGNOSIS:  Transient global amnesia, rule out stroke or seizure.  DISCHARGE DIAGNOSIS:  Transient global amnesia, etiology unclear.  ADDITIONAL DIAGNOSES: 1. Remote history of breast cancer, status post right mastectomy and implant. 2. Mild depression and anxiety.  DISCHARGE MEDICATIONS: 1. Enteric-coated aspirin 325 mg one q.d. 2. Resume prior medications which are BuSpar and Prozac.  The patient was    unaware of her previous doses.  We put her on low dose Prozac 10 mg q.d.,    BuSpar 5 mg 1/2 b.i.d.  She should resume what she had previously been    taking under Dr. Nada Boozer care.  Likewise, she had been taking a weekly    osteoporosis medication which was not Fosamax.  She should resume that    medication as well under Dr. Nada Boozer direction.  CONDITION ON DISCHARGE:  Improved at baseline neurologically with normal cognition.  DIET:  Regular.  ACTIVITY:  No restrictions.  She may resume her usual activities, including driving.  FOLLOWUP:  She should follow up in three weeks with Dr. Orlin Hilding.  Call [phone] for an appointment.  She needs to have a CBC done in one week to follow up on a mildly low white blood cell count of 3.8.  PROCEDURES PERFORMED: 1. Carotid Dopplers which showed minimal intimal thickening with no internal    carotid artery stenosis and antegrade vertebral flow. 2. MRI scan of the brain which was normal, did not show a pontine infarct as    was suggested by the CAT scan that was likely an artifact. 3. A 2-D echocardiogram which was normal with good left ventricular function    at 55 to 65%.  No evidence of clot. 4. EEG showed questionable slight left temporal slowing, but no seizure. 5. MR angiogram  was negative except for a fetal variant of the right PCA    coming from the ______.  LABORATORY DATA:  Essentially normal CMET except for a minimally elevated glucose at 126.  The admission CBC was completely normal.  At discharge, the white blood cell count had dropped slightly to 3.8, which is minimally below normal.  HOSPITAL COURSE:  Please see history and physical for details.  Briefly, this is a 77 year old woman who was admitted with an episode of transient global amnesia.  She had approximately a 2 to 3 hour spell where she was amnestic for events within the last two weeks that gradually returned to normal.  Interestingly, she had a family history of the father who had similar symptoms as a one time event in his 35s.  She gradually improved back to baseline.  An initial CT questioned the possibility of a pons infarct, but her examination was normal except for cognitive change and memory problems, and this was not felt to be real, but artifactual, and the MRI confirmed this was not real.  She was treated initially with heparin on the basis of a possible transient ischemic attack.  At the completion of the workup, without any clear etiology, this was discontinued, and she is discharged on aspirin.  It is speculated that it still could be a transient ischemic  attack, and that is why we do not see anything on the scan, because of the transient nature, but may be explaining the left temporal swelling.  She will have a repeat EEG done after her followup visit to reevaluate that. DD:  07/27/00 TD:  07/28/00 Job: [id number]

## 2010-11-05 NOTE — H&P (Signed)
[facility]  Patient:    Dawn Diaz, Dawn Diaz                       MRN: [id number] Adm. Date:  [id number] Attending:  Durel Salts                         History and Physical  CHIEF COMPLAINT:  Confusion.  HISTORY OF PRESENT ILLNESS:  Ms. Lassalle is a 77 year old right-handed white woman with a history of right breast cancer, status post mastectomy, also with depression.  She is generally active and healthy.  Earlier today she had gone to the store, and when she came out she could not remember where the car was. She called her husband who came to get her and noted that she was confused and amnestic for events of the past two weeks.  She was brought to the emergency room for further evaluation.  According to the family she has been improving steadily since she has been here.  REVIEW OF SYSTEMS:  No headache, no shortness of breath, no chest pain, no vision loss, weakness, numbness, or language problems.  She has complaint of a rash on her right shoulder, the last couple of days.  PAST MEDICAL HISTORY:  Significant for right breast cancer; status post mastectomy and implant, which was saline, about two years ago.  She also has been treated for depression and anxiety, mild and has some arthritis.  CURRENT MEDICATIONS: 1. BuSpar. 2. Prozac. 3. Vitamins 1 per week. 4. Pill for osteoarthritis which is not Fosamax, she thinks it is acetenyl,    but is not sure.  ALLERGIES:  No known drug allergies.  She is sensitive to insect bites and stings.  SOCIAL HISTORY:  Married, remote cigarette use, rare alcohol.  FAMILY HISTORY:  Sister had a stroke at age 65.  PHYSICAL EXAMINATION:  VITAL SIGNS:  Temperature is 97.2, pulse 77, respirations 20, blood pressure 156/82.  HEENT:  Head is normocephalic, atraumatic.  NECK:  Supple without bruits.  HEART:  Regular rate and rhythm.  LUNGS:  Clear to auscultation.  EXTREMITIES:  Without  edema.  BREASTS:  Right breast implant.  She has a small 2 x 3 cm blanching rash on the right shoulder.  NEUROLOGIC:  She is awake, alert, oriented to place, month, day, year, but not date.  She is able to name, repeat, follow commands.  She registers 3 out of 3 ______ after distraction, immediately; remembers only 1 out of 3.  On a second attempt she was able to register and recall 3 out of 3.  Spelled world forwards and backwards correctly.  Cranial nerves:  Pupils are equal and reactive.  Disk margins are sharp.  Visual fields are full to confrontation. Extraocular movements are intact.  ______ sensation is normal, facial and motor activities normal.  Hearing is intact.  Power is symmetric.  Tongue is in midline.  On motor exam she has normal bulk, tone and strength in all four extremities, no drift in upper or lower extremities, normal rapid fine movements.  Reflexes are 3+ and symmetric with no pathologic reflexes, downgoing toes, coordination finger-to-nose, and heel-to-shin were intact.  I did not check gait or tandem gait.  Sensory exam was intact, primary modalities.  X-RAY AND LABORATORY DATA:  CT scan of the right is essentially normal except for a question of a right pontine infarct versus artifact.  EKG is pending.  CBC  is normal.  ______ was normal except for a glucose of 126, this was random.  IMPRESSION: Episode of confusion, amnesia, transient global amnesia, transient ischemic attack, seizure are all in the differential.  PLAN:  Will treat as stroke until we can rule it out.  Start her on IV heparin, get an MRI of the brain, MR angiogram, 2-D echo, carotid Doppler, and EEG.  Treat her with IV fluids and oxygen. DD:  07/25/00 TD:  07/26/00 Job: [id number]

## 2010-11-05 NOTE — Assessment & Plan Note (Signed)
[facility]                           GASTROENTEROLOGY OFFICE NOTE   NAME:Dawn Diaz, Dawn Diaz                       MRN:          [id number]  DATE:04/25/2006                            DOB:          [birth year]    REFERRING PHYSICIAN:  Gwen Pounds, MD   REASON FOR REFERRAL:  Change in bowel habits, diarrhea, and family history  of colon cancer.   HISTORY OF PRESENT ILLNESS:  Dawn Diaz is a very nice 77 year old white  female who I have previously evaluated.  She underwent colonoscopy in  November, 2002 for abdominal pain, bloating, and hematochezia.  Her  colonoscopy was only complete to the transverse colon due to a tortuous and  redundant colon.  Subsequently air contrast barium enema showed diffuse  diverticulosis.  She has noted a recent change in bowel habits with lower  abdominal, gas and bloating, associated with urgent post-prandial formed  bowel movements.  She generally has 3-4 formed bowel movements a day, and  these generally follow meals.  She notes no melena, hematochezia, change in  stool caliber, or weight loss.  She has noted some mild intermittent left  upper quadrant pain that does not appear to correlate with bowel movements  or meals.  She describes it as a dull ache under her left costal margin.  Her sister was diagnosed with colon cancer at age 12 and passed away last  year at age 62.  No other family members with colon cancer, colon polyps, or  inflammatory bowel disease.   PAST MEDICAL HISTORY:  Diverticulosis, diverticulitis, GERD.  Status post  right mastectomy for stage I breast cancer in July, 1997.  Status post  hysterectomy at age 89 for fibroids, status post tonsillectomy.  Status post  benign breast biopsy 38 years ago.  History of a left ovarian cyst.  History  of bunions.  History of osteoporosis.  History of anxiety.  History of  depression.   CURRENT MEDICATIONS:  Listed on the chart-updated, and reviewed.   MEDICATION ALLERGIES:  AMOXICILLIN.   SOCIAL HISTORY:  Per the handwritten form.   REVIEW OF SYSTEMS:  Per the handwritten form.   PHYSICAL EXAMINATION:  GENERAL:  A well-developed and well-nourished white  female in no acute distress.  VITAL SIGNS:  Height 5 feet 8 inches.  Weight 140 pounds.  Blood pressure is  130/60, pulse 72 and regular.  HEENT:  Anicteric sclerae.  Oropharynx clear.  CHEST:  Clear to auscultation bilaterally.  CARDIAC:  Regular rate and rhythm without murmurs appreciated.  ABDOMEN:  Soft, nontender, nondistended.  Normoactive bowel sounds.  No  palpable organomegaly, masses, or hernias.  NEUROLOGIC:  Alert and oriented x3.  Grossly nonfocal.   ASSESSMENT/PLAN:  Change in bowel habits.  Presumed flare of irritable bowel  syndrome.  Known diverticulosis.  First-degree relative with  colon cancer.  Given her prior incomplete colonoscopy due to a  tortuous and redundant  colon, she is given the option of colonoscopy, CT colography, or an air  contrasted barium enema with flexible sigmoidoscopy.  After discussing the  risks, benefits and alternatives, she  elects to proceed with a CT  colography.  A trial of Levsin 1-2 sublingually or orally before meals.  Return office visit in six weeks.     Venita Lick. Russella Dar, MD, [facility]  Electronically Signed    MTS/MedQ  DD: 04/25/2006  DT: 04/25/2006  Job #: [id number]   cc:   Gwen Pounds, MD

## 2010-11-05 NOTE — Op Note (Signed)
Dawn Diaz, Dawn Diaz                ACCOUNT NO.:  [id number]   MEDICAL RECORD NO.:  [id number]          PATIENT TYPE:  AMB   LOCATION:  DSC                          FACILITY:  [facility]   PHYSICIAN:  Mila Homer. Sherlean Foot, M.D. DATE OF BIRTH:  Jan 13, 1927   DATE OF PROCEDURE:  06/30/2004  DATE OF DISCHARGE:  06/30/2004                                 OPERATIVE REPORT   PREOPERATIVE DIAGNOSIS:  Left knee medial meniscal tear.   POSTOPERATIVE DIAGNOSIS:  Left knee medial meniscal tear and lateral  meniscal tear.   OPERATION PERFORMED:   SURGEON:  Mila Homer. Sherlean Foot, M.D.   ASSISTANT:  None.   ANESTHESIA:  General.   INDICATIONS FOR PROCEDURE:  The patient is a 77 year old white female in  good health with mechanical symptoms and MRI evidence of a medial and  lateral meniscal tear.  Informed consent was obtained.   DESCRIPTION OF PROCEDURE:  The patient was laid supine and administered  general anesthesia.  The left lower extremity was prepped and draped in the  usual sterile fashion.  Inferolateral and inferomedial portals were created  with a #11 blade, blunt trocar and cannula.  Diagnostic arthroscopy revealed  medial and lateral meniscal tears with minimal chondromalacia in any of the  three compartments.  Straight basket and forceps and a [place] shaver  was used to perform a partial medial and lateral meniscectomy.  The knee was  then lavaged and the port was closed with interrupted 4-0 nylon sutures and  dressing sponges, ABDs, Webrile and Ace wrap.  The patient tolerated the procedure well.   COMPLICATIONS:  None.   DRAINS:  None.      SDL/MEDQ  D:  07/20/2004  T:  07/20/2004  Job:  [id number]

## 2010-12-17 ENCOUNTER — Telehealth: Payer: Self-pay | Admitting: Gastroenterology

## 2010-12-17 NOTE — Telephone Encounter (Signed)
Patient is c/o bilateral lower abdominal pain and bloating.  She reports the pain has been there for several weeks and the discomfort is getting worse. She has a history of IBS and diverticulosis.  She will come in and see Mike Gip PA on Monday at 9:30

## 2010-12-20 ENCOUNTER — Ambulatory Visit (INDEPENDENT_AMBULATORY_CARE_PROVIDER_SITE_OTHER): Payer: Medicare Other | Admitting: Physician Assistant

## 2010-12-20 ENCOUNTER — Other Ambulatory Visit (INDEPENDENT_AMBULATORY_CARE_PROVIDER_SITE_OTHER): Payer: Medicare Other

## 2010-12-20 ENCOUNTER — Telehealth: Payer: Self-pay | Admitting: *Deleted

## 2010-12-20 ENCOUNTER — Encounter: Payer: Self-pay | Admitting: Physician Assistant

## 2010-12-20 VITALS — BP 108/66 | HR 84 | Ht 68.0 in | Wt 135.0 lb

## 2010-12-20 DIAGNOSIS — R1084 Generalized abdominal pain: Secondary | ICD-10-CM

## 2010-12-20 DIAGNOSIS — K589 Irritable bowel syndrome without diarrhea: Secondary | ICD-10-CM

## 2010-12-20 DIAGNOSIS — K6389 Other specified diseases of intestine: Secondary | ICD-10-CM

## 2010-12-20 DIAGNOSIS — M069 Rheumatoid arthritis, unspecified: Secondary | ICD-10-CM | POA: Insufficient documentation

## 2010-12-20 DIAGNOSIS — Z86010 Personal history of colonic polyps: Secondary | ICD-10-CM

## 2010-12-20 DIAGNOSIS — Z853 Personal history of malignant neoplasm of breast: Secondary | ICD-10-CM | POA: Insufficient documentation

## 2010-12-20 LAB — CBC WITH DIFFERENTIAL/PLATELET
Basophils Relative: 1.9 % (ref 0.0–3.0)
Eosinophils Relative: 2.8 % (ref 0.0–5.0)
HCT: 39.3 % (ref 36.0–46.0)
Hemoglobin: 13.7 g/dL (ref 12.0–15.0)
Lymphs Abs: 0.7 10*3/uL (ref 0.7–4.0)
Monocytes Relative: 9.1 % (ref 3.0–12.0)
Neutro Abs: 3.1 10*3/uL (ref 1.4–7.7)
Platelets: 219 10*3/uL (ref 150.0–400.0)
RBC: 4.35 Mil/uL (ref 3.87–5.11)
WBC: 4.4 10*3/uL — ABNORMAL LOW (ref 4.5–10.5)

## 2010-12-20 MED ORDER — METRONIDAZOLE 250 MG PO TABS: 250.0000 mg | ORAL_TABLET | Freq: Three times a day (TID) | ORAL | Status: AC

## 2010-12-20 NOTE — Progress Notes (Signed)
Agree with assessment and plan with following addition:  At 23 she may not need to continue routine preventive colorectal cancer screening with CT or optical colonoscopy.  Iva Boop, MD, [facility]

## 2010-12-20 NOTE — Telephone Encounter (Signed)
Message copied by Daphine Deutscher on Mon Dec 20, 2010  4:41 PM ------      Message from: Mike Gip S      Created: Mon Dec 20, 2010  3:45 PM       Please let pt know her cbc was normal.

## 2010-12-20 NOTE — Patient Instructions (Signed)
Please go to the basement level to have your labs drawn.  We have sent a prescription for Flagyl 250 mg to your pharmacy [facility] [place], [address]  Continue the probiotic daily. We have given you a Gas and Flatulence Prevention Diet brochure. Call us back on Friday morning with a progress report, call Exa Bomba at [phone].

## 2010-12-20 NOTE — Telephone Encounter (Signed)
Patient notified of results.

## 2010-12-20 NOTE — Progress Notes (Signed)
  Subjective:    Patient ID: Dawn Diaz, female    DOB: 1926-09-29, 77 y.o.   MRN: [id number]  HPI Dawn Diaz is a pleasant 77 year old white female known to Dr. Damita Lack with history of IBS colon polyps diverticulosis and family history of colon cancer in her sister. Patient last had virtual colonoscopy in 2007 followed by flexible sigmoidoscopy because of a rectal polyp noted on the virtual colonoscopy. At colonoscopy she had a 5 mm polyp in the rectum which was removed and was hyperplastic. She has left-sided diverticulosis.  The patient comes in today after onset about 10 days ago of bilateral lower abdominal discomfort which gradually progressed followed by an extreme amount of 5 gas and more frequent bowel movements associated with mucus no bleeding and no diarrhea. She reports low-grade temp in the 100 range over a couple of days which has since resolved. She had no nausea or vomiting has been eating very light and such she actually feels better over the past 24 hours. She has not had any known infectious exposures no new medications there did start back on plaque with a lot of month ago which he has taken in the past. She has not had any antibiotics over the past few months. She had been given I am asked in the past which he did not find helpful.    Review of Systems  Constitutional: Positive for fever.  HENT: Negative.   Eyes: Negative.   Respiratory: Negative.   Cardiovascular: Negative.   Gastrointestinal: Positive for abdominal pain and abdominal distention.  Genitourinary: Negative.   Musculoskeletal: Negative.   Skin: Negative.   Neurological: Negative.   Hematological: Negative.   Psychiatric/Behavioral: Negative.        Objective:   Physical Exam Well-developed elderly white female in no acute distress appears younger than stated age, pleasant, alert oriented x3 HEENT;  normocephalic EOMI PERRLA sclera anicteric  Neck;supple no JVD  Cardiovascular; regular rate and rhythm with  S1-S2 no murmur rub or gallop  Pulmonary; clear  Abdomen; soft nondistended minimally tender bilateral lower quadrants no guarding no rebound no palpable mass or hepatosplenomegaly bowel sounds active  Rectal; not done  Skin;; warm and dry benign   Psych; mood and affect normal and appropriate.        Assessment & Plan:  #61 77 year old female with history of IBS, diverticulosis,colon polyp,s and family history of colon cancer with 10 day history of bloating gas mucoid stools and transient low-grade temp. On exam she does not appear to have diverticulitis, suspect a viral syndrome versus bacterial overgrowth syndrome.  Plan; Continue daily probiotic as doing Have provided at a low gas diet Trial of Flagyl 250 mg 3 times daily x10 days for bacterial overgrowth Patient that we'll call in 4-5 days with a progress report She is due for followup colonoscopy in December 2012 and prefers to do virtual colonoscopy as she did previously, because she has a tortuous colon which was very hard to traverse.  #2 Rheumatoid arthritis On plaquenil.

## 2010-12-24 ENCOUNTER — Telehealth: Payer: Self-pay | Admitting: Gastroenterology

## 2010-12-24 NOTE — Telephone Encounter (Signed)
Pt called and is feeling better after seeing the PA this past Monday.  Is out of town but will finish taking the medication that was given to her. Any questions she can be reached at her cell phone number above.

## 2011-03-10 LAB — COMPREHENSIVE METABOLIC PANEL
ALT: 14
AST: 26
Alkaline Phosphatase: 75
Calcium: 9.6
GFR calc Af Amer: >60
Potassium: 4.2
Sodium: 139
Total Protein: 6.8

## 2011-03-10 LAB — DIFFERENTIAL
Basophils Relative: 1
Eosinophils Absolute: 0.3
Eosinophils Relative: 6 — ABNORMAL HIGH
Lymphs Abs: 1.1
Monocytes Absolute: 0.5
Monocytes Relative: 9

## 2011-03-10 LAB — TYPE AND SCREEN

## 2011-03-10 LAB — PROTIME-INR: INR: 1

## 2011-03-10 LAB — CBC
Platelets: 233
RDW: 12.3

## 2011-03-10 LAB — ABO/RH: ABO/RH(D): A POS

## 2011-03-15 LAB — CBC
Hemoglobin: 14.5
RBC: 4.72
RDW: 12.9
WBC: 4

## 2011-03-15 LAB — PROTIME-INR: INR: 1

## 2011-03-15 LAB — APTT: aPTT: 32

## 2011-03-18 ENCOUNTER — Telehealth: Payer: Self-pay | Admitting: *Deleted

## 2011-03-18 NOTE — Telephone Encounter (Signed)
Patient had called [place] when our power was out yesterday on 03-17-2011.  She complained of the same lower abdominal pain she had when she saw Amy 12-20-2010. I asked how long she has felt this way and she said for the last 2-3 weeks.  She said she was afibrile.  Her bowels were moving normally , her bowel habits had not changed. She saw no blood in her stools. She is also experiencing uncontrollable gas like she had in July. At that time she was diagnosed with bacterial overgrowth.  Per Mike Gip PA, she is prescribing the same antibiotic as she took in July.  I had to call the pharmacy due to our system being down.  Amy told me to call in the same Flagyl, (Metronidazole)  250 mg, 1 tab 3 times a day for 10 days.  I asked the pt to please call me the end of next week around 03-24-2011.   She thanked [place] for helping her.

## 2011-05-04 ENCOUNTER — Telehealth: Payer: Self-pay | Admitting: Oncology

## 2011-05-04 NOTE — Telephone Encounter (Signed)
Pt called in to schedule her 2013 appt for jan.

## 2011-05-06 ENCOUNTER — Other Ambulatory Visit: Payer: Self-pay | Admitting: Oncology

## 2011-05-06 DIAGNOSIS — Z1231 Encounter for screening mammogram for malignant neoplasm of breast: Secondary | ICD-10-CM

## 2011-05-06 DIAGNOSIS — Z9011 Acquired absence of right breast and nipple: Secondary | ICD-10-CM

## 2011-06-24 ENCOUNTER — Ambulatory Visit
Admission: RE | Admit: 2011-06-24 | Discharge: 2011-06-24 | Disposition: A | Discharge status: Home / Self Care | Payer: Medicare Other | Source: Ambulatory Visit | Attending: Oncology | Admitting: Oncology

## 2011-06-24 DIAGNOSIS — Z1231 Encounter for screening mammogram for malignant neoplasm of breast: Secondary | ICD-10-CM

## 2011-06-24 DIAGNOSIS — Z9011 Acquired absence of right breast and nipple: Secondary | ICD-10-CM

## 2011-07-04 DIAGNOSIS — Z79899 Other long term (current) drug therapy: Secondary | ICD-10-CM | POA: Diagnosis not present

## 2011-07-08 ENCOUNTER — Ambulatory Visit (HOSPITAL_BASED_OUTPATIENT_CLINIC_OR_DEPARTMENT_OTHER): Payer: Medicare Other | Admitting: Oncology

## 2011-07-08 ENCOUNTER — Telehealth: Payer: Self-pay | Admitting: Oncology

## 2011-07-08 ENCOUNTER — Other Ambulatory Visit: Payer: Self-pay | Admitting: Oncology

## 2011-07-08 VITALS — BP 135/74 | HR 73 | Temp 97.8°F | Ht 68.0 in | Wt 137.5 lb

## 2011-07-08 DIAGNOSIS — M25569 Pain in unspecified knee: Secondary | ICD-10-CM | POA: Diagnosis not present

## 2011-07-08 DIAGNOSIS — M069 Rheumatoid arthritis, unspecified: Secondary | ICD-10-CM | POA: Diagnosis not present

## 2011-07-08 DIAGNOSIS — Z79899 Other long term (current) drug therapy: Secondary | ICD-10-CM | POA: Diagnosis not present

## 2011-07-08 DIAGNOSIS — M25549 Pain in joints of unspecified hand: Secondary | ICD-10-CM | POA: Diagnosis not present

## 2011-07-08 DIAGNOSIS — Z79810 Long term (current) use of selective estrogen receptor modulators (SERMs): Secondary | ICD-10-CM | POA: Diagnosis not present

## 2011-07-08 DIAGNOSIS — C50919 Malignant neoplasm of unspecified site of unspecified female breast: Secondary | ICD-10-CM | POA: Diagnosis not present

## 2011-07-08 DIAGNOSIS — Z170 Estrogen receptor positive status [ER+]: Secondary | ICD-10-CM

## 2011-07-08 DIAGNOSIS — M1710 Unilateral primary osteoarthritis, unspecified knee: Secondary | ICD-10-CM | POA: Diagnosis not present

## 2011-07-08 NOTE — Telephone Encounter (Signed)
appt made and printed for 04/10/12  aom

## 2011-07-08 NOTE — Progress Notes (Signed)
OFFICE PROGRESS NOTE   INTERVAL HISTORY:   She returns as scheduled. She continues tamoxifen. There's been no change at the chest wall. She denies hot flashes and signs of venous thrombosis. She continues to see Dr.Deveshwar for treatment of arthritis. A left mammogram was negative on 06/27/2011.  Objective:  Vital signs in last 24 hours:  Blood pressure 135/74, pulse 73, temperature 97.8 F (36.6 C), temperature source Oral, height 5\' 8"  (1.727 m), weight 137 lb 8 oz (62.37 kg).    HEENT: Neck without mass Lymphatics: No cervical, supraclavicular, or right axillary nodes. 1/2 cm soft mobile left axillary node. Resp: Lungs clear bilaterally Cardio: Regular rate and rhythm GI: No hepatomegaly Vascular: No leg edema  Breasts: Status post right mastectomy. Radiation  telengectasias over the right anterior chest wall. No evidence of recurrent tumor. Left breast without mass.     Medications: I have reviewed the patient's current medications.  Assessment/Plan: 1. Extensive DCIS of the right breast diagnosed in 1997, status post a right mastectomy. 2. Chest wall recurrence with invasive breast cancer (ER positive, PR positive and HER-2/neu positive), status post removal of the right breast implant and a wide excision procedure in January 2009 with negative surgical margins.  She began Arimidex in April 2009.  She completed adjuvant radiation to the right chest wall.  She was unable to tolerate the Arimidex due to arthralgias.  She was switched to tamoxifen in July 2009. 3. Tiny lung nodule on a CT of the chest in December 2008 - likely a benign finding. 4. History of multiple thyroid nodules - now followed by Dr Gerrit Friends. 5. Extensive family history of cancer. 6. Arthralgias secondary to Arimidex and Aromasin - she continues followup with Dr Corliss Skains for "arthritis."     Disposition:  She remains in clinical remission from breast cancer. She will continue tamoxifen. She will return for  an office visit in 9 months. She will contact us in the interim for new symptoms.   Dawn Shutters, MD  07/08/2011  12:25 PM

## 2011-07-11 ENCOUNTER — Encounter: Payer: Self-pay | Admitting: Gastroenterology

## 2011-07-15 DIAGNOSIS — M1710 Unilateral primary osteoarthritis, unspecified knee: Secondary | ICD-10-CM | POA: Diagnosis not present

## 2011-07-22 DIAGNOSIS — M1710 Unilateral primary osteoarthritis, unspecified knee: Secondary | ICD-10-CM | POA: Diagnosis not present

## 2011-08-22 ENCOUNTER — Ambulatory Visit (INDEPENDENT_AMBULATORY_CARE_PROVIDER_SITE_OTHER): Payer: Medicare Other | Admitting: Gastroenterology

## 2011-08-22 ENCOUNTER — Encounter: Payer: Self-pay | Admitting: Gastroenterology

## 2011-08-22 VITALS — BP 146/78 | HR 76 | Ht 68.0 in | Wt 138.4 lb

## 2011-08-22 DIAGNOSIS — Z800 Family history of malignant neoplasm of digestive organs: Secondary | ICD-10-CM | POA: Diagnosis not present

## 2011-08-22 DIAGNOSIS — K589 Irritable bowel syndrome without diarrhea: Secondary | ICD-10-CM | POA: Diagnosis not present

## 2011-08-22 MED ORDER — HYOSCYAMINE SULFATE 0.125 MG SL SUBL: SUBLINGUAL_TABLET | SUBLINGUAL | Status: DC

## 2011-08-22 NOTE — Progress Notes (Signed)
History of Present Illness: This is an 78 year old female with a long history of irritable bowel syndrome. She has urgent postprandial diarrhea associated with abdominal cramping. She has had incomplete colonoscopies in the past and underwent a virtual colonoscopy in 2007 which showed questionable polyps in the left colon which was not confirmed on flexible sigmoidoscopy. Denies weight loss, abdominal pain, constipation, change in stool caliber, melena, hematochezia, nausea, vomiting, dysphagia, reflux symptoms, chest pain.  Review of Systems: Pertinent positive and negative review of systems were noted in the above HPI section. All other review of systems were otherwise negative.  Current Medications, Allergies, Past Medical History, Past Surgical History, Family History and Social History were reviewed in [employer] record.  Physical Exam: General: Well developed , well nourished, no acute distress Head: Normocephalic and atraumatic Eyes:  sclerae anicteric, EOMI Ears: Normal auditory acuity Mouth: No deformity or lesions Neck: Supple, no masses or thyromegaly Lungs: Clear throughout to auscultation Heart: Regular rate and rhythm; no murmurs, rubs or bruits Abdomen: Soft, non tender and non distended. No masses, hepatosplenomegaly or hernias noted. Normal Bowel sounds Rectal: Deferred to colonoscopy Musculoskeletal: Symmetrical with no gross deformities  Skin: No lesions on visible extremities Pulses:  Normal pulses noted Extremities: No clubbing, cyanosis, edema or deformities noted Neurological: Alert oriented x 4, grossly nonfocal Cervical Nodes:  No significant cervical adenopathy Inguinal Nodes: No significant inguinal adenopathy Psychological:  Alert and cooperative. Normal mood and affect  Assessment and Recommendations:  1. Irritable bowel syndrome. Begin hyoscyamine 1 to 2 every 4 hours as needed. Consider a trial of a probiotic.  2. Family history of  colon cancer in her sister at age 38. We discussed the options of her re-attempting colonoscopy with deeper sedation with propofol, barium enema with flexible sigmoidoscopy or virtual colonoscopy. She prefers she virtual colonoscopy which will be scheduled. If no polyps are noted and likely discontinue future screening examinations given her age.

## 2011-08-22 NOTE — Patient Instructions (Signed)
You have been scheduled for a Virtual Colonoscopy at [facility] ([address] Location) on Monday 09/05/11 at 9:00am. Please arrive 15 minutes early for registration. Please stop by there office one day prior to the day before your procedure to get the prep instructions.  We have sent the following medications to your pharmacy for you to pick up at your convenience: Levsin. cc: Creola Corn, MD

## 2011-08-26 DIAGNOSIS — G4700 Insomnia, unspecified: Secondary | ICD-10-CM | POA: Diagnosis not present

## 2011-09-01 DIAGNOSIS — H409 Unspecified glaucoma: Secondary | ICD-10-CM | POA: Diagnosis not present

## 2011-09-01 DIAGNOSIS — Z79899 Other long term (current) drug therapy: Secondary | ICD-10-CM | POA: Diagnosis not present

## 2011-09-01 DIAGNOSIS — H4011X0 Primary open-angle glaucoma, stage unspecified: Secondary | ICD-10-CM | POA: Diagnosis not present

## 2011-09-05 ENCOUNTER — Ambulatory Visit
Admission: RE | Admit: 2011-09-05 | Discharge: 2011-09-05 | Disposition: A | Discharge status: Home / Self Care | Payer: Medicare Other | Source: Ambulatory Visit | Attending: Gastroenterology | Admitting: Gastroenterology

## 2011-09-05 DIAGNOSIS — Z800 Family history of malignant neoplasm of digestive organs: Secondary | ICD-10-CM

## 2011-09-05 DIAGNOSIS — K589 Irritable bowel syndrome without diarrhea: Secondary | ICD-10-CM

## 2011-10-04 ENCOUNTER — Telehealth: Payer: Self-pay | Admitting: Oncology

## 2011-10-04 DIAGNOSIS — Z79899 Other long term (current) drug therapy: Secondary | ICD-10-CM | POA: Diagnosis not present

## 2011-10-04 NOTE — Telephone Encounter (Signed)
pt called and r/s appt time on 10-22 to 11am

## 2011-10-06 DIAGNOSIS — M25549 Pain in joints of unspecified hand: Secondary | ICD-10-CM | POA: Diagnosis not present

## 2011-10-06 DIAGNOSIS — M25569 Pain in unspecified knee: Secondary | ICD-10-CM | POA: Diagnosis not present

## 2011-10-06 DIAGNOSIS — M069 Rheumatoid arthritis, unspecified: Secondary | ICD-10-CM | POA: Diagnosis not present

## 2011-11-07 DIAGNOSIS — R82998 Other abnormal findings in urine: Secondary | ICD-10-CM | POA: Diagnosis not present

## 2011-11-07 DIAGNOSIS — Z79899 Other long term (current) drug therapy: Secondary | ICD-10-CM | POA: Diagnosis not present

## 2011-11-07 DIAGNOSIS — E042 Nontoxic multinodular goiter: Secondary | ICD-10-CM | POA: Diagnosis not present

## 2011-11-07 DIAGNOSIS — E559 Vitamin D deficiency, unspecified: Secondary | ICD-10-CM | POA: Diagnosis not present

## 2011-11-07 DIAGNOSIS — E063 Autoimmune thyroiditis: Secondary | ICD-10-CM | POA: Diagnosis not present

## 2011-11-16 DIAGNOSIS — Z0000 Encounter for general adult medical examination without abnormal findings: Secondary | ICD-10-CM | POA: Diagnosis not present

## 2011-11-16 DIAGNOSIS — M069 Rheumatoid arthritis, unspecified: Secondary | ICD-10-CM | POA: Diagnosis not present

## 2011-11-16 DIAGNOSIS — E063 Autoimmune thyroiditis: Secondary | ICD-10-CM | POA: Diagnosis not present

## 2011-11-16 DIAGNOSIS — E041 Nontoxic single thyroid nodule: Secondary | ICD-10-CM | POA: Diagnosis not present

## 2011-11-17 DIAGNOSIS — M25569 Pain in unspecified knee: Secondary | ICD-10-CM | POA: Diagnosis not present

## 2011-11-21 DIAGNOSIS — Z1212 Encounter for screening for malignant neoplasm of rectum: Secondary | ICD-10-CM | POA: Diagnosis not present

## 2011-11-28 DIAGNOSIS — H4011X0 Primary open-angle glaucoma, stage unspecified: Secondary | ICD-10-CM | POA: Diagnosis not present

## 2011-11-28 DIAGNOSIS — H2510 Age-related nuclear cataract, unspecified eye: Secondary | ICD-10-CM | POA: Diagnosis not present

## 2011-11-28 DIAGNOSIS — H409 Unspecified glaucoma: Secondary | ICD-10-CM | POA: Diagnosis not present

## 2011-11-28 DIAGNOSIS — Z961 Presence of intraocular lens: Secondary | ICD-10-CM | POA: Diagnosis not present

## 2011-12-07 NOTE — Progress Notes (Signed)
Order from [facility] [employer] signed by MD and faxed 12/05/11.

## 2011-12-14 ENCOUNTER — Other Ambulatory Visit: Payer: Self-pay | Admitting: Dermatology

## 2011-12-14 DIAGNOSIS — B079 Viral wart, unspecified: Secondary | ICD-10-CM | POA: Diagnosis not present

## 2011-12-14 DIAGNOSIS — D485 Neoplasm of uncertain behavior of skin: Secondary | ICD-10-CM | POA: Diagnosis not present

## 2011-12-16 DIAGNOSIS — J029 Acute pharyngitis, unspecified: Secondary | ICD-10-CM | POA: Diagnosis not present

## 2011-12-29 DIAGNOSIS — Z79899 Other long term (current) drug therapy: Secondary | ICD-10-CM | POA: Diagnosis not present

## 2012-01-18 DIAGNOSIS — M25549 Pain in joints of unspecified hand: Secondary | ICD-10-CM | POA: Diagnosis not present

## 2012-01-18 DIAGNOSIS — M069 Rheumatoid arthritis, unspecified: Secondary | ICD-10-CM | POA: Diagnosis not present

## 2012-01-18 DIAGNOSIS — M25569 Pain in unspecified knee: Secondary | ICD-10-CM | POA: Diagnosis not present

## 2012-01-31 ENCOUNTER — Other Ambulatory Visit: Payer: Self-pay | Admitting: *Deleted

## 2012-01-31 DIAGNOSIS — M1710 Unilateral primary osteoarthritis, unspecified knee: Secondary | ICD-10-CM | POA: Diagnosis not present

## 2012-01-31 NOTE — Telephone Encounter (Signed)
Left VM for patient to please call us with exact phone # for the [facility] location she uses for her refills or she could call the phone # on her Tamoxifen bottle to generate the refill request. Pharmacy will send request to our triage nurse to refill-this is the more appropriate approach to take.

## 2012-01-31 NOTE — Telephone Encounter (Signed)
Patient left VM requesting refill on Tamoxifen 20 mg (only has #10 day supply on hand). Going out of town end of week for a week and needs refill soon. Apologizes for calling so late for refill. Uses [facility].

## 2012-02-07 ENCOUNTER — Other Ambulatory Visit: Payer: Self-pay | Admitting: *Deleted

## 2012-02-07 MED ORDER — TAMOXIFEN CITRATE 20 MG PO TABS: 20.0000 mg | ORAL_TABLET | Freq: Every day | ORAL | Status: DC

## 2012-03-01 DIAGNOSIS — H2510 Age-related nuclear cataract, unspecified eye: Secondary | ICD-10-CM | POA: Diagnosis not present

## 2012-03-01 DIAGNOSIS — H4011X0 Primary open-angle glaucoma, stage unspecified: Secondary | ICD-10-CM | POA: Diagnosis not present

## 2012-03-01 DIAGNOSIS — H409 Unspecified glaucoma: Secondary | ICD-10-CM | POA: Diagnosis not present

## 2012-03-06 DIAGNOSIS — Z23 Encounter for immunization: Secondary | ICD-10-CM | POA: Diagnosis not present

## 2012-03-08 ENCOUNTER — Telehealth: Payer: Self-pay | Admitting: Oncology

## 2012-03-08 NOTE — Telephone Encounter (Signed)
pt called to move appt from 10/22 to 10/21,moved appt and l/m wtih appt info     aom

## 2012-04-02 ENCOUNTER — Telehealth: Payer: Self-pay | Admitting: Gastroenterology

## 2012-04-02 NOTE — Telephone Encounter (Signed)
Patient reports that she is having painful gas and an increase in BM.  She had similar symptoms last year and was seen that time by Mike Gip PA.  She was treated for presumed bacterial over growth.  She feels her symptoms are the same.  I have scheduled her to see Dr Russella Dar on 04/04/12 to discuss

## 2012-04-04 ENCOUNTER — Ambulatory Visit (INDEPENDENT_AMBULATORY_CARE_PROVIDER_SITE_OTHER): Payer: Medicare Other | Admitting: Gastroenterology

## 2012-04-04 ENCOUNTER — Encounter: Payer: Self-pay | Admitting: Gastroenterology

## 2012-04-04 VITALS — BP 140/70 | HR 64 | Ht 68.0 in | Wt 136.2 lb

## 2012-04-04 DIAGNOSIS — R143 Flatulence: Secondary | ICD-10-CM | POA: Diagnosis not present

## 2012-04-04 DIAGNOSIS — R141 Gas pain: Secondary | ICD-10-CM

## 2012-04-04 MED ORDER — METRONIDAZOLE 250 MG PO TABS: 250.0000 mg | ORAL_TABLET | Freq: Three times a day (TID) | ORAL | Status: DC

## 2012-04-04 NOTE — Patient Instructions (Signed)
You Gas-X over the counter three times a day as needed long term for gas, bloating.  We have sent the following medications to your pharmacy for you to pick up at your convenience: Flagyl.

## 2012-04-04 NOTE — Progress Notes (Signed)
History of Present Illness: This is an 78 year old female who has complaints of gas and mild migrating abdominal pains appear to be related to gas. She's been taking Gas-X 3 times a day recently which has substantially improved her symptoms. He took a ten-day course of metronidazole last year for possible bacterial overgrowth which improved her symptoms for about one year.   Current Medications, Allergies, Past Medical History, Past Surgical History, Family History and Social History were reviewed in [employer] record.  Physical Exam: General: Well developed , well nourished, no acute distress Head: Normocephalic and atraumatic Eyes:  sclerae anicteric, EOMI Ears: Normal auditory acuity Mouth: No deformity or lesions Lungs: Clear throughout to auscultation Heart: Regular rate and rhythm; no murmurs, rubs or bruits Abdomen: Soft, non tender and non distended. No masses, hepatosplenomegaly or hernias noted. Normal Bowel sounds Musculoskeletal: Symmetrical with no gross deformities  Pulses:  Normal pulses noted Extremities: No clubbing, cyanosis, edema or deformities noted Neurological: Alert oriented x 4, grossly nonfocal Psychological:  Alert and cooperative. Normal mood and affect  Assessment and Recommendations:  1. Gas and bloating associated with abdominal pain. Low gas diet. Gas-X 3 times a day when necessary. If these measures are not adequate another ten-day course of metronidazole would be reasonable for possible bacterial overgrowth.

## 2012-04-09 ENCOUNTER — Ambulatory Visit (HOSPITAL_BASED_OUTPATIENT_CLINIC_OR_DEPARTMENT_OTHER): Payer: Medicare Other | Admitting: Oncology

## 2012-04-09 ENCOUNTER — Telehealth: Payer: Self-pay | Admitting: Oncology

## 2012-04-09 VITALS — BP 124/67 | HR 74 | Temp 97.1°F | Resp 20 | Ht 68.0 in | Wt 135.5 lb

## 2012-04-09 DIAGNOSIS — Z853 Personal history of malignant neoplasm of breast: Secondary | ICD-10-CM

## 2012-04-09 DIAGNOSIS — Z9010 Acquired absence of unspecified breast and nipple: Secondary | ICD-10-CM

## 2012-04-09 DIAGNOSIS — C44599 Other specified malignant neoplasm of skin of other part of trunk: Secondary | ICD-10-CM | POA: Diagnosis not present

## 2012-04-09 DIAGNOSIS — Z87898 Personal history of other specified conditions: Secondary | ICD-10-CM | POA: Diagnosis not present

## 2012-04-09 NOTE — Telephone Encounter (Signed)
Gave pt appt for mammogram for January 2014 then see Md on July 2014

## 2012-04-09 NOTE — Progress Notes (Signed)
   [facility]    OFFICE PROGRESS NOTE   INTERVAL HISTORY:   She returns as scheduled. She continues tamoxifen. No change over the chest wall. The arthritis pain has improved with Plaquenil. She feels well. A left mammogram was negative on 06/27/2011.  Objective:  Vital signs in last 24 hours:  Blood pressure 124/67, pulse 74, temperature 97.1 F (36.2 C), resp. rate 20, height 5\' 8"  (1.727 m), weight 135 lb 8 oz (61.462 kg).    HEENT: Neck without mass Lymphatics: No cervical, supraclavicular, or axillary nodes Resp: Lungs clear bilaterally Cardio: Regular rate and rhythm GI: No hepatomegaly Vascular: No leg edema Breasts: Status post right mastectomy. Radiation telangiectasias over the right chest wall. No evidence for chest wall tumor recurrence. Left breast without mass.      Medications: I have reviewed the patient's current medications.  Assessment/Plan: 1. Extensive DCIS of the right breast diagnosed in 1997, status post a right mastectomy. 2. Chest wall recurrence with invasive breast cancer (ER positive, PR positive and HER-2/neu positive), status post removal of the right breast implant and a wide excision procedure in January 2009 with negative surgical margins.  She began Arimidex in April 2009.  She completed adjuvant radiation to the right chest wall.  She was unable to tolerate the Arimidex due to arthralgias.  She was switched to tamoxifen in July 2009. 3. Tiny lung nodule on a CT of the chest in December 2008 - likely a benign finding. 4. History of multiple thyroid nodules - now followed by Dr Gerrit Friends. 5. Extensive family history of cancer. 6. Arthralgias secondary to Arimidex and Aromasin - she continues followup with Dr Corliss Skains for "arthritis."  She reports being diagnosed with rheumatoid arthritis. There has been clinical improvement with Plaquenil.   Disposition:  Dawn Diaz remains in clinical remission from breast cancer. She will  continue tamoxifen. She will be scheduled for a left mammogram in January of 2014. [name] will return for an office visit in 9 months.   Thornton Papas, MD  04/09/2012  1:02 PM

## 2012-04-10 ENCOUNTER — Ambulatory Visit: Payer: Medicare Other | Admitting: Oncology

## 2012-04-10 DIAGNOSIS — Z85828 Personal history of other malignant neoplasm of skin: Secondary | ICD-10-CM | POA: Diagnosis not present

## 2012-04-10 DIAGNOSIS — Z411 Encounter for cosmetic surgery: Secondary | ICD-10-CM | POA: Diagnosis not present

## 2012-04-10 DIAGNOSIS — L570 Actinic keratosis: Secondary | ICD-10-CM | POA: Diagnosis not present

## 2012-04-10 DIAGNOSIS — L723 Sebaceous cyst: Secondary | ICD-10-CM | POA: Diagnosis not present

## 2012-04-10 DIAGNOSIS — L719 Rosacea, unspecified: Secondary | ICD-10-CM | POA: Diagnosis not present

## 2012-04-11 DIAGNOSIS — N3946 Mixed incontinence: Secondary | ICD-10-CM | POA: Diagnosis not present

## 2012-05-08 DIAGNOSIS — Z79899 Other long term (current) drug therapy: Secondary | ICD-10-CM | POA: Diagnosis not present

## 2012-05-21 DIAGNOSIS — G4700 Insomnia, unspecified: Secondary | ICD-10-CM | POA: Diagnosis not present

## 2012-05-21 DIAGNOSIS — M129 Arthropathy, unspecified: Secondary | ICD-10-CM | POA: Diagnosis not present

## 2012-05-21 DIAGNOSIS — M069 Rheumatoid arthritis, unspecified: Secondary | ICD-10-CM | POA: Diagnosis not present

## 2012-05-21 DIAGNOSIS — F341 Dysthymic disorder: Secondary | ICD-10-CM | POA: Diagnosis not present

## 2012-05-22 DIAGNOSIS — M545 Low back pain: Secondary | ICD-10-CM | POA: Diagnosis not present

## 2012-05-22 DIAGNOSIS — M069 Rheumatoid arthritis, unspecified: Secondary | ICD-10-CM | POA: Diagnosis not present

## 2012-05-22 DIAGNOSIS — M899 Disorder of bone, unspecified: Secondary | ICD-10-CM | POA: Diagnosis not present

## 2012-05-22 DIAGNOSIS — M1710 Unilateral primary osteoarthritis, unspecified knee: Secondary | ICD-10-CM | POA: Diagnosis not present

## 2012-05-22 DIAGNOSIS — M949 Disorder of cartilage, unspecified: Secondary | ICD-10-CM | POA: Diagnosis not present

## 2012-06-27 ENCOUNTER — Ambulatory Visit
Admission: RE | Admit: 2012-06-27 | Discharge: 2012-06-27 | Disposition: A | Discharge status: Home / Self Care | Payer: Medicare Other | Source: Ambulatory Visit | Attending: Oncology | Admitting: Oncology

## 2012-06-27 DIAGNOSIS — Z1231 Encounter for screening mammogram for malignant neoplasm of breast: Secondary | ICD-10-CM | POA: Diagnosis not present

## 2012-06-27 DIAGNOSIS — Z853 Personal history of malignant neoplasm of breast: Secondary | ICD-10-CM

## 2012-07-23 DIAGNOSIS — R141 Gas pain: Secondary | ICD-10-CM | POA: Diagnosis not present

## 2012-07-23 DIAGNOSIS — Z124 Encounter for screening for malignant neoplasm of cervix: Secondary | ICD-10-CM | POA: Diagnosis not present

## 2012-07-23 DIAGNOSIS — R143 Flatulence: Secondary | ICD-10-CM | POA: Diagnosis not present

## 2012-07-23 DIAGNOSIS — Z01419 Encounter for gynecological examination (general) (routine) without abnormal findings: Secondary | ICD-10-CM | POA: Diagnosis not present

## 2012-07-31 ENCOUNTER — Telehealth: Payer: Self-pay | Admitting: *Deleted

## 2012-07-31 DIAGNOSIS — L723 Sebaceous cyst: Secondary | ICD-10-CM | POA: Diagnosis not present

## 2012-07-31 DIAGNOSIS — R279 Unspecified lack of coordination: Secondary | ICD-10-CM | POA: Diagnosis not present

## 2012-07-31 DIAGNOSIS — N3946 Mixed incontinence: Secondary | ICD-10-CM | POA: Diagnosis not present

## 2012-07-31 DIAGNOSIS — L719 Rosacea, unspecified: Secondary | ICD-10-CM | POA: Diagnosis not present

## 2012-07-31 DIAGNOSIS — M6281 Muscle weakness (generalized): Secondary | ICD-10-CM | POA: Diagnosis not present

## 2012-07-31 DIAGNOSIS — L570 Actinic keratosis: Secondary | ICD-10-CM | POA: Diagnosis not present

## 2012-07-31 DIAGNOSIS — R3915 Urgency of urination: Secondary | ICD-10-CM | POA: Diagnosis not present

## 2012-07-31 NOTE — Telephone Encounter (Signed)
Called to inquire about status of refill on her Tamoxifen. Mentioned 10 mg vs. 20 mg tabs.

## 2012-07-31 NOTE — Telephone Encounter (Signed)
Attempted to reach patient to discuss refill request. Was not home. Informed husband nurse will try to call back later today or tomorrow.

## 2012-08-01 ENCOUNTER — Other Ambulatory Visit: Payer: Self-pay | Admitting: *Deleted

## 2012-08-01 DIAGNOSIS — Z853 Personal history of malignant neoplasm of breast: Secondary | ICD-10-CM

## 2012-08-01 MED ORDER — TAMOXIFEN CITRATE 10 MG PO TABS: 20.0000 mg | ORAL_TABLET | Freq: Every day | ORAL | Status: DC

## 2012-08-01 NOTE — Telephone Encounter (Signed)
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 Hope to have available in late March. They will call if can't get 10 mg tablets. Suggests trying local pharmacy if they don't have it until it is back in manufacture.

## 2012-08-04 ENCOUNTER — Other Ambulatory Visit: Payer: Self-pay

## 2012-08-07 DIAGNOSIS — N83209 Unspecified ovarian cyst, unspecified side: Secondary | ICD-10-CM | POA: Diagnosis not present

## 2012-08-13 DIAGNOSIS — R279 Unspecified lack of coordination: Secondary | ICD-10-CM | POA: Diagnosis not present

## 2012-08-13 DIAGNOSIS — R3915 Urgency of urination: Secondary | ICD-10-CM | POA: Diagnosis not present

## 2012-08-13 DIAGNOSIS — N3946 Mixed incontinence: Secondary | ICD-10-CM | POA: Diagnosis not present

## 2012-08-13 DIAGNOSIS — M6281 Muscle weakness (generalized): Secondary | ICD-10-CM | POA: Diagnosis not present

## 2012-08-23 DIAGNOSIS — N3946 Mixed incontinence: Secondary | ICD-10-CM | POA: Diagnosis not present

## 2012-08-23 DIAGNOSIS — R279 Unspecified lack of coordination: Secondary | ICD-10-CM | POA: Diagnosis not present

## 2012-08-23 DIAGNOSIS — R3915 Urgency of urination: Secondary | ICD-10-CM | POA: Diagnosis not present

## 2012-08-23 DIAGNOSIS — M6281 Muscle weakness (generalized): Secondary | ICD-10-CM | POA: Diagnosis not present

## 2012-08-30 DIAGNOSIS — H409 Unspecified glaucoma: Secondary | ICD-10-CM | POA: Diagnosis not present

## 2012-08-30 DIAGNOSIS — H4011X0 Primary open-angle glaucoma, stage unspecified: Secondary | ICD-10-CM | POA: Diagnosis not present

## 2012-09-10 DIAGNOSIS — M6281 Muscle weakness (generalized): Secondary | ICD-10-CM | POA: Diagnosis not present

## 2012-09-10 DIAGNOSIS — R3915 Urgency of urination: Secondary | ICD-10-CM | POA: Diagnosis not present

## 2012-09-10 DIAGNOSIS — R279 Unspecified lack of coordination: Secondary | ICD-10-CM | POA: Diagnosis not present

## 2012-09-10 DIAGNOSIS — N3946 Mixed incontinence: Secondary | ICD-10-CM | POA: Diagnosis not present

## 2012-10-01 DIAGNOSIS — R279 Unspecified lack of coordination: Secondary | ICD-10-CM | POA: Diagnosis not present

## 2012-10-01 DIAGNOSIS — R350 Frequency of micturition: Secondary | ICD-10-CM | POA: Diagnosis not present

## 2012-10-01 DIAGNOSIS — M6281 Muscle weakness (generalized): Secondary | ICD-10-CM | POA: Diagnosis not present

## 2012-10-01 DIAGNOSIS — N3946 Mixed incontinence: Secondary | ICD-10-CM | POA: Diagnosis not present

## 2012-10-22 DIAGNOSIS — N3946 Mixed incontinence: Secondary | ICD-10-CM | POA: Diagnosis not present

## 2012-10-22 DIAGNOSIS — R3915 Urgency of urination: Secondary | ICD-10-CM | POA: Diagnosis not present

## 2012-10-22 DIAGNOSIS — R279 Unspecified lack of coordination: Secondary | ICD-10-CM | POA: Diagnosis not present

## 2012-10-22 DIAGNOSIS — M6281 Muscle weakness (generalized): Secondary | ICD-10-CM | POA: Diagnosis not present

## 2012-11-13 DIAGNOSIS — Z79899 Other long term (current) drug therapy: Secondary | ICD-10-CM | POA: Diagnosis not present

## 2012-11-13 DIAGNOSIS — E041 Nontoxic single thyroid nodule: Secondary | ICD-10-CM | POA: Diagnosis not present

## 2012-11-13 DIAGNOSIS — E063 Autoimmune thyroiditis: Secondary | ICD-10-CM | POA: Diagnosis not present

## 2012-11-13 DIAGNOSIS — E559 Vitamin D deficiency, unspecified: Secondary | ICD-10-CM | POA: Diagnosis not present

## 2012-11-19 DIAGNOSIS — M129 Arthropathy, unspecified: Secondary | ICD-10-CM | POA: Diagnosis not present

## 2012-11-19 DIAGNOSIS — Z0000 Encounter for general adult medical examination without abnormal findings: Secondary | ICD-10-CM | POA: Diagnosis not present

## 2012-11-19 DIAGNOSIS — G4700 Insomnia, unspecified: Secondary | ICD-10-CM | POA: Diagnosis not present

## 2012-11-19 DIAGNOSIS — F341 Dysthymic disorder: Secondary | ICD-10-CM | POA: Diagnosis not present

## 2012-11-19 DIAGNOSIS — R32 Unspecified urinary incontinence: Secondary | ICD-10-CM | POA: Diagnosis not present

## 2012-11-19 DIAGNOSIS — E041 Nontoxic single thyroid nodule: Secondary | ICD-10-CM | POA: Diagnosis not present

## 2012-11-19 DIAGNOSIS — C50919 Malignant neoplasm of unspecified site of unspecified female breast: Secondary | ICD-10-CM | POA: Diagnosis not present

## 2012-11-19 DIAGNOSIS — M949 Disorder of cartilage, unspecified: Secondary | ICD-10-CM | POA: Diagnosis not present

## 2012-11-19 DIAGNOSIS — Z1331 Encounter for screening for depression: Secondary | ICD-10-CM | POA: Diagnosis not present

## 2012-11-19 DIAGNOSIS — M899 Disorder of bone, unspecified: Secondary | ICD-10-CM | POA: Diagnosis not present

## 2012-11-19 DIAGNOSIS — M069 Rheumatoid arthritis, unspecified: Secondary | ICD-10-CM | POA: Diagnosis not present

## 2012-11-19 DIAGNOSIS — Z1212 Encounter for screening for malignant neoplasm of rectum: Secondary | ICD-10-CM | POA: Diagnosis not present

## 2012-11-20 DIAGNOSIS — Z23 Encounter for immunization: Secondary | ICD-10-CM | POA: Diagnosis not present

## 2012-12-19 DIAGNOSIS — M19049 Primary osteoarthritis, unspecified hand: Secondary | ICD-10-CM | POA: Diagnosis not present

## 2012-12-19 DIAGNOSIS — Z09 Encounter for follow-up examination after completed treatment for conditions other than malignant neoplasm: Secondary | ICD-10-CM | POA: Diagnosis not present

## 2012-12-19 DIAGNOSIS — M069 Rheumatoid arthritis, unspecified: Secondary | ICD-10-CM | POA: Diagnosis not present

## 2012-12-19 DIAGNOSIS — M1710 Unilateral primary osteoarthritis, unspecified knee: Secondary | ICD-10-CM | POA: Diagnosis not present

## 2013-01-07 ENCOUNTER — Telehealth: Payer: Self-pay | Admitting: Oncology

## 2013-01-07 ENCOUNTER — Ambulatory Visit (HOSPITAL_BASED_OUTPATIENT_CLINIC_OR_DEPARTMENT_OTHER): Payer: Medicare Other | Admitting: Oncology

## 2013-01-07 VITALS — BP 130/53 | HR 70 | Temp 98.0°F | Resp 18 | Ht 68.0 in | Wt 135.8 lb

## 2013-01-07 DIAGNOSIS — Z853 Personal history of malignant neoplasm of breast: Secondary | ICD-10-CM

## 2013-01-07 NOTE — Telephone Encounter (Signed)
gv and printed appt sched and avs for pt  °

## 2013-01-07 NOTE — Progress Notes (Signed)
   [facility]    OFFICE PROGRESS NOTE   INTERVAL HISTORY:   She returns as scheduled. She continues tamoxifen. No hot flashes or symptoms of thrombosis. No change over the chest wall. She feels well. She continues to have "arthritis "and plans to have a knee replacement this fall.  Objective:  Vital signs in last 24 hours:  Blood pressure 130/53, pulse 70, temperature 98 F (36.7 C), temperature source Oral, resp. rate 18, height 5\' 8"  (1.727 m), weight 135 lb 12.8 oz (61.598 kg).    HEENT: Neck without mass Lymphatics: No cervical, supraclavicular, or right axillary nodes.? 1/2 cm soft left axillary node. Resp: Lungs clear bilaterally Cardio: Regular rate and rhythm GI: No hepatomegaly Vascular: No leg edema  Breast: Since his right mastectomy. Radiation telangiectasias over the right chest wall. No evidence for chest wall tumor recurrence.  Left breast without mass    Medications: I have reviewed the patient's current medications.  Assessment/Plan: 1. Extensive DCIS of the right breast diagnosed in 1997, status post a right mastectomy. 2. Chest wall recurrence with invasive breast cancer (ER positive, PR positive and HER-2/neu positive), status post removal of the right breast implant and a wide excision procedure in January 2009 with negative surgical margins. She began Arimidex in April 2009. She completed adjuvant radiation to the right chest wall. She was unable to tolerate the Arimidex due to arthralgias. She was switched to tamoxifen in July 2009. 3. Tiny lung nodule on a CT of the chest in December 2008 - likely a benign finding. 4. History of multiple thyroid nodules - now followed by Dr Gerrit Friends. 5. Extensive family history of cancer. 6. Arthralgias secondary to Arimidex and Aromasin - she continues followup with Dr Corliss Skains for "arthritis." She reports being diagnosed with rheumatoid arthritis. There has been clinical improvement with  Plaquenil.  Disposition:  She remains in clinical remission from breast cancer. She will continue tamoxifen. She will schedule a left mammogram for January of 2015. Dawn Diaz will return for an office visit in 9 months.   Thornton Papas, MD  01/07/2013  10:05 AM

## 2013-01-18 ENCOUNTER — Other Ambulatory Visit: Payer: Self-pay | Admitting: Gastroenterology

## 2013-01-22 DIAGNOSIS — M1710 Unilateral primary osteoarthritis, unspecified knee: Secondary | ICD-10-CM | POA: Diagnosis not present

## 2013-01-23 ENCOUNTER — Other Ambulatory Visit: Payer: Self-pay

## 2013-02-05 DIAGNOSIS — N8300 Follicular cyst of ovary, unspecified side: Secondary | ICD-10-CM | POA: Diagnosis not present

## 2013-02-19 DIAGNOSIS — R300 Dysuria: Secondary | ICD-10-CM | POA: Diagnosis not present

## 2013-03-05 DIAGNOSIS — H4011X0 Primary open-angle glaucoma, stage unspecified: Secondary | ICD-10-CM | POA: Diagnosis not present

## 2013-03-05 DIAGNOSIS — H409 Unspecified glaucoma: Secondary | ICD-10-CM | POA: Diagnosis not present

## 2013-03-08 DIAGNOSIS — Z23 Encounter for immunization: Secondary | ICD-10-CM | POA: Diagnosis not present

## 2013-03-28 ENCOUNTER — Other Ambulatory Visit: Payer: Self-pay | Admitting: Orthopedic Surgery

## 2013-04-04 ENCOUNTER — Encounter (HOSPITAL_COMMUNITY): Payer: Self-pay | Admitting: Pharmacy Technician

## 2013-04-04 DIAGNOSIS — H409 Unspecified glaucoma: Secondary | ICD-10-CM | POA: Diagnosis not present

## 2013-04-04 DIAGNOSIS — H4011X0 Primary open-angle glaucoma, stage unspecified: Secondary | ICD-10-CM | POA: Diagnosis not present

## 2013-04-10 ENCOUNTER — Encounter (HOSPITAL_COMMUNITY)
Admission: RE | Admit: 2013-04-10 | Discharge: 2013-04-10 | Disposition: A | Discharge status: Home / Self Care | Payer: Medicare Other | Source: Ambulatory Visit | Attending: Orthopedic Surgery | Admitting: Orthopedic Surgery

## 2013-04-10 ENCOUNTER — Encounter (HOSPITAL_COMMUNITY): Payer: Self-pay

## 2013-04-10 DIAGNOSIS — Z01818 Encounter for other preprocedural examination: Secondary | ICD-10-CM | POA: Diagnosis not present

## 2013-04-10 DIAGNOSIS — Z01810 Encounter for preprocedural cardiovascular examination: Secondary | ICD-10-CM | POA: Diagnosis not present

## 2013-04-10 DIAGNOSIS — Z01812 Encounter for preprocedural laboratory examination: Secondary | ICD-10-CM | POA: Insufficient documentation

## 2013-04-10 HISTORY — DX: Personal history of transient ischemic attack (TIA), and cerebral infarction without residual deficits: Z86.73

## 2013-04-10 LAB — URINALYSIS, ROUTINE W REFLEX MICROSCOPIC
Bilirubin Urine: NEGATIVE
Hgb urine dipstick: NEGATIVE
Ketones, ur: NEGATIVE mg/dL
Nitrite: NEGATIVE
Specific Gravity, Urine: 1.015 (ref 1.005–1.030)
Urobilinogen, UA: 0.2 mg/dL (ref 0.0–1.0)
pH: 6 (ref 5.0–8.0)

## 2013-04-10 LAB — SURGICAL PCR SCREEN
MRSA, PCR: NEGATIVE
Staphylococcus aureus: NEGATIVE

## 2013-04-10 LAB — PROTIME-INR
INR: 1 (ref 0.00–1.49)
Prothrombin Time: 13 seconds (ref 11.6–15.2)

## 2013-04-10 LAB — COMPREHENSIVE METABOLIC PANEL
Alkaline Phosphatase: 66 U/L (ref 39–117)
BUN: 14 mg/dL (ref 6–23)
CO2: 30 mEq/L (ref 19–32)
Calcium: 9.1 mg/dL (ref 8.4–10.5)
Chloride: 101 mEq/L (ref 96–112)
Creatinine, Ser: 0.79 mg/dL (ref 0.50–1.10)
GFR calc non Af Amer: 75 mL/min — ABNORMAL LOW (ref >90)
Potassium: 4.4 mEq/L (ref 3.5–5.1)
Total Bilirubin: 0.4 mg/dL (ref 0.3–1.2)

## 2013-04-10 LAB — CBC
HCT: 46.5 % — ABNORMAL HIGH (ref 36.0–46.0)
Hemoglobin: 15.4 g/dL — ABNORMAL HIGH (ref 12.0–15.0)
MCH: 30.2 pg (ref 26.0–34.0)
MCV: 91.2 fL (ref 78.0–100.0)
RBC: 5.1 MIL/uL (ref 3.87–5.11)
RDW: 12.6 % (ref 11.5–15.5)
WBC: 5.4 10*3/uL (ref 4.0–10.5)

## 2013-04-10 LAB — APTT: aPTT: 28 seconds (ref 24–37)

## 2013-04-10 NOTE — Patient Instructions (Addendum)
20 DARSHA ZUMSTEIN  04/10/2013   Your procedure is scheduled on: 10-27  -2014  Report to [facility] at     1045   AM .  Call this number if you have problems the morning of surgery: [phone]  Or Presurgical Testing [phone](Isabela Nardelli)      Do not eat food:After Midnight.  May have clear liquids:up to 6 Hours before arrival. Nothing after : 0700 AM    Clear liquids include soda, tea, black coffee, apple or grape juice, broth.  Take these medicines the morning of surgery with A SIP OF WATER: Tamoxifen. Bring/ use eyedrops.   Do not wear jewelry, make-up or nail polish.  Do not wear lotions, powders, or perfumes. You may wear deodorant.  Do not shave 12 hours prior to first CHG shower(legs and under arms).(face and neck okay.)  Do not bring valuables to the hospital.  Contacts, dentures or bridgework,body piercing,  may not be worn into surgery.  Leave suitcase in the car. After surgery it may be brought to your room.  For patients admitted to the hospital, checkout time is 11:00 AM the day of discharge.   Patients discharged the day of surgery will not be allowed to drive home. Must have responsible person with you x 24 hours once discharged.  Name and phone number of your driver: spouse Carlean Jews [phone]  Special Instructions: CHG(Chlorhedine 4%-"Hibiclens","Betasept","Aplicare") Shower Use Special Wash: see special instructions.(avoid face and genitals)   Please read over the following fact sheets that you were given: MRSA Information, Blood Transfusion fact sheet, Incentive Spirometry Instruction.    Failure to follow these instructions may result in Cancellation of your surgery.   Patient signature_______________________________________________________

## 2013-04-10 NOTE — Pre-Procedure Instructions (Signed)
04-10-13 EKG done today

## 2013-04-11 ENCOUNTER — Other Ambulatory Visit: Payer: Self-pay | Admitting: *Deleted

## 2013-04-11 NOTE — Telephone Encounter (Signed)
Call from pt requesting Tamoxifen refill. Confirmed with pharmacy, last refill was shipped 04/10/13 for 3 mo supply. Will reorder late Jan for one year supply, per Dr. Truett Perna.

## 2013-04-12 DIAGNOSIS — N9489 Other specified conditions associated with female genital organs and menstrual cycle: Secondary | ICD-10-CM | POA: Diagnosis not present

## 2013-04-12 DIAGNOSIS — L293 Anogenital pruritus, unspecified: Secondary | ICD-10-CM | POA: Diagnosis not present

## 2013-04-14 ENCOUNTER — Other Ambulatory Visit: Payer: Self-pay | Admitting: Orthopedic Surgery

## 2013-04-14 NOTE — H&P (Signed)
Dawn Diaz  DOB: 1927-05-19 Married / Language: [place] / Race: White Female  Date of Admission:  04/15/2013  Chief Complaint:  Right Knee Pain  History of Present Illness The patient is a 79 year old female who comes in for a preoperative History and Physical. The patient is scheduled for a right total knee arthroplasty to be performed by Dr. Gus Rankin. Aluisio, MD at [facility] on 04/15/2013. The patient is a 79 year old female who presents for follow up of their knee. The patient is being followed for their bilateral knee pain and osteoarthritis. They are now 1 year(s) out from last visit. Symptoms reported today include: pain and swelling. The patient feels that they are doing poorly and report their pain level to be moderate. Note for "Follow-up Knee": Patient states that her right knee is worse. She has had to limit her walking and swimming because of her knee. [name] said that her knee is getting progressively worse over time. The right knee is worse than the left although both are painful. This limits what she can and can not do. She has had injections in the past without benefit. She feels her knee is having a very negative affect on her lifestyle. She is to the point she would like a more permanent solution for this. She is ready to proceed with knee surgery. They have been treated conservatively in the past for the above stated problem and despite conservative measures, they continue to have progressive pain and severe functional limitations and dysfunction. They have failed non-operative management including home exercise, medications, and injections. It is felt that they would benefit from undergoing total joint replacement. Risks and benefits of the procedure have been discussed with the patient and they elect to proceed with surgery. There are no active contraindications to surgery such as ongoing infection or rapidly progressive neurological  disease.    Problem List Osteoarthritis, knee (715.96)    Allergies Amoxicillin *PENICILLINS*. Hives. Latex. Itching.    Family History Cancer. mother, father and sister Diabetes Mellitus. father    Social History Exercise. does running / walking and other Illicit drug use. no Children. 3 Current work status. retired Marital status. married Number of flights of stairs before winded. 2-3 Pain Contract. no Alcohol use. never consumed alcohol Post-Surgical Plans. Plan is for [facility]    Medication History Tamoxifen Citrate (20MG  Tablet, Oral) Active. Aspirin EC (81MG  Tablet DR, Oral) Active. Vitamin D3 (1000UNIT Tablet, Oral) Active. Probiotic ( Oral) Active. Hydroxychloroquine Sulfate (200MG  Tablet, Oral) Active. Timolol Maleate ( Ophthalmic) Specific dose unknown - Active. Ambien (5MG  Tablet, Oral) Active.    Past Surgical History Foot Surgery. bilateral Mastectomy. right Breast Reconstruction. right Cataract Surgery. left   Medical History Rheumatoid Arthritis Diverticulitis Of Colon Anxiety Disorder Glaucoma Cataract Irritable bowel syndrome Diverticulosis Urinary Incontinence Measles Mumps Breast cancer    Review of Systems General:Not Present- Chills, Fever, Night Sweats, Fatigue, Weight Gain, Weight Loss and Memory Loss. Skin:Not Present- Hives, Itching, Rash, Eczema and Lesions. HEENT:Not Present- Tinnitus, Headache, Double Vision, Visual Loss, Hearing Loss and Dentures. Respiratory:Not Present- Shortness of breath with exertion, Shortness of breath at rest, Allergies, Coughing up blood and Chronic Cough. Cardiovascular:Not Present- Chest Pain, Racing/skipping heartbeats, Difficulty Breathing Lying Down, Murmur, Swelling and Palpitations. Gastrointestinal:Present- Abdominal Pain. Not Present- Bloody Stool, Heartburn, Vomiting, Nausea, Constipation, Diarrhea, Difficulty Swallowing, Jaundice and Loss  of appetitie. Female Genitourinary:Present- Urinary Retention (during the day) and Urinating at Night. Not Present- Blood in Urine, Urinary  frequency, Weak urinary stream, Discharge, Flank Pain, Incontinence, Painful Urination and Urgency. Musculoskeletal:Present- Morning Stiffness. Not Present- Muscle Weakness, Muscle Pain, Joint Swelling, Joint Pain, Back Pain and Spasms. Neurological:Not Present- Tremor, Dizziness, Blackout spells, Paralysis, Difficulty with balance and Weakness. Psychiatric:Not Present- Insomnia.    Vitals Weight: 130 lb Height: 68 in Weight was reported by patient. Height was reported by patient. Body Surface Area: 1.68 m Body Mass Index: 19.77 kg/m Pulse: 76 (Regular) Resp.: 14 (Unlabored) BP: 124/68 (Sitting, Left Arm, Standard)     Physical Exam The physical exam findings are as follows:   General Mental Status - Alert, cooperative and good historian. General Appearance- pleasant. Not in acute distress. Orientation- Oriented X3. Build & Nutrition- Well nourished and Well developed.   Head and Neck Head- normocephalic, atraumatic . Neck Global Assessment- supple. no bruit auscultated on the right and no bruit auscultated on the left.   Eye Vision- Wears corrective lenses. Pupil- Bilateral- Regular and Round. Motion- Bilateral- EOMI.   Chest and Lung Exam Auscultation: Breath sounds:- clear at anterior chest wall and - clear at posterior chest wall. Adventitious sounds:- No Adventitious sounds.   Cardiovascular Auscultation:Rhythm- Regular rate and rhythm. Heart Sounds- S1 WNL and S2 WNL. Murmurs & Other Heart Sounds:Auscultation of the heart reveals - No Murmurs.   Abdomen Palpation/Percussion:Tenderness- Abdomen is non-tender to palpation. Rigidity (guarding)- Abdomen is soft. Auscultation:Auscultation of the abdomen reveals - Bowel sounds normal.   Female Genitourinary Not done, not  pertinent to present illness  Musculoskeletal She is a very pleasant , well developed female. She is alert and oriented. No apparent distress. Evaluation of her hips show a normal range of motion. No discomfort. The left knee shows no effusion. Range is 0-135. There is slight crepitus on range of motion. No specific joint line tenderness or instability. The right knee shows no effusion. There is marked crepitus on range of motion of the right knee. Range is 5-125. There is a slight varus. Tenderness, medial greater than lateral with no instability noted. Pulse, sensation and motor intact both lower extremities. Antalgic gait on the right.  RADIOGRAPHS: AP of both knees and lateral show a bone on bone change in he medial and patellofemoral compartments of the right knee. The left knee has some arthritic changes also but not as bad as the right.   Assessment & Plan Osteoarthritis, knee (715.96) Impression: Right Knee  Note: Plan is for a Right Total Knee Replacement by Dr. Lequita Halt.  Plan is to go to [facility]  PCP - Dr. Creola Corn - Patient has been seen preoperatively and felt to be stable for surgery.  The patient will not receive TXA (tranexamic acid) due to: Breast Cancer  Signed electronically by Lauraine Rinne, III PA-C

## 2013-04-15 ENCOUNTER — Inpatient Hospital Stay (HOSPITAL_COMMUNITY)
Admission: RE | Admit: 2013-04-15 | Discharge: 2013-04-18 | DRG: 470 | Disposition: A | Discharge status: Skilled Nursing Facility | Payer: Medicare Other | Source: Ambulatory Visit | Attending: Orthopedic Surgery | Admitting: Orthopedic Surgery

## 2013-04-15 ENCOUNTER — Encounter (HOSPITAL_COMMUNITY): Payer: Medicare Other | Admitting: Anesthesiology

## 2013-04-15 ENCOUNTER — Encounter (HOSPITAL_COMMUNITY)
Admission: RE | Disposition: A | Discharge status: Skilled Nursing Facility | Payer: Self-pay | Source: Ambulatory Visit | Attending: Orthopedic Surgery

## 2013-04-15 ENCOUNTER — Encounter (HOSPITAL_COMMUNITY): Payer: Self-pay | Admitting: *Deleted

## 2013-04-15 ENCOUNTER — Inpatient Hospital Stay (HOSPITAL_COMMUNITY): Payer: Medicare Other | Admitting: Anesthesiology

## 2013-04-15 DIAGNOSIS — Z8673 Personal history of transient ischemic attack (TIA), and cerebral infarction without residual deficits: Secondary | ICD-10-CM | POA: Diagnosis not present

## 2013-04-15 DIAGNOSIS — M6281 Muscle weakness (generalized): Secondary | ICD-10-CM | POA: Diagnosis not present

## 2013-04-15 DIAGNOSIS — Z853 Personal history of malignant neoplasm of breast: Secondary | ICD-10-CM | POA: Diagnosis present

## 2013-04-15 DIAGNOSIS — Z96651 Presence of right artificial knee joint: Secondary | ICD-10-CM

## 2013-04-15 DIAGNOSIS — S8990XA Unspecified injury of unspecified lower leg, initial encounter: Secondary | ICD-10-CM | POA: Diagnosis not present

## 2013-04-15 DIAGNOSIS — F411 Generalized anxiety disorder: Secondary | ICD-10-CM | POA: Diagnosis present

## 2013-04-15 DIAGNOSIS — Z880 Allergy status to penicillin: Secondary | ICD-10-CM | POA: Diagnosis not present

## 2013-04-15 DIAGNOSIS — M1710 Unilateral primary osteoarthritis, unspecified knee: Principal | ICD-10-CM | POA: Diagnosis present

## 2013-04-15 DIAGNOSIS — M25569 Pain in unspecified knee: Secondary | ICD-10-CM | POA: Diagnosis not present

## 2013-04-15 DIAGNOSIS — M1990 Unspecified osteoarthritis, unspecified site: Secondary | ICD-10-CM | POA: Diagnosis not present

## 2013-04-15 DIAGNOSIS — IMO0002 Reserved for concepts with insufficient information to code with codable children: Secondary | ICD-10-CM

## 2013-04-15 DIAGNOSIS — K219 Gastro-esophageal reflux disease without esophagitis: Secondary | ICD-10-CM | POA: Diagnosis present

## 2013-04-15 DIAGNOSIS — Z8719 Personal history of other diseases of the digestive system: Secondary | ICD-10-CM | POA: Diagnosis not present

## 2013-04-15 DIAGNOSIS — Z9010 Acquired absence of unspecified breast and nipple: Secondary | ICD-10-CM

## 2013-04-15 DIAGNOSIS — M069 Rheumatoid arthritis, unspecified: Secondary | ICD-10-CM | POA: Diagnosis not present

## 2013-04-15 DIAGNOSIS — Z471 Aftercare following joint replacement surgery: Secondary | ICD-10-CM | POA: Diagnosis not present

## 2013-04-15 DIAGNOSIS — M179 Osteoarthritis of knee, unspecified: Secondary | ICD-10-CM | POA: Diagnosis present

## 2013-04-15 DIAGNOSIS — H409 Unspecified glaucoma: Secondary | ICD-10-CM | POA: Diagnosis present

## 2013-04-15 DIAGNOSIS — K589 Irritable bowel syndrome without diarrhea: Secondary | ICD-10-CM | POA: Diagnosis not present

## 2013-04-15 DIAGNOSIS — Z96659 Presence of unspecified artificial knee joint: Secondary | ICD-10-CM | POA: Diagnosis not present

## 2013-04-15 DIAGNOSIS — R269 Unspecified abnormalities of gait and mobility: Secondary | ICD-10-CM | POA: Diagnosis not present

## 2013-04-15 DIAGNOSIS — K5732 Diverticulitis of large intestine without perforation or abscess without bleeding: Secondary | ICD-10-CM | POA: Diagnosis not present

## 2013-04-15 HISTORY — PX: TOTAL KNEE ARTHROPLASTY: SHX125

## 2013-04-15 LAB — TYPE AND SCREEN: Antibody Screen: NEGATIVE

## 2013-04-15 SURGERY — ARTHROPLASTY, KNEE, TOTAL
Anesthesia: Spinal | Site: Knee | Laterality: Right | Wound class: Clean

## 2013-04-15 MED ORDER — MENTHOL 3 MG MT LOZG
1.0000 | LOZENGE | OROMUCOSAL | Status: DC | PRN
  Filled 2013-04-15: qty 9

## 2013-04-15 MED ORDER — BISACODYL 10 MG RE SUPP: 10.0000 mg | Freq: Every day | RECTAL | Status: DC | PRN

## 2013-04-15 MED ORDER — ONDANSETRON HCL 4 MG PO TABS
4.0000 mg | ORAL_TABLET | Freq: Four times a day (QID) | ORAL | Status: DC | PRN
  Administered 2013-04-15 – 2013-04-17 (×2): 4 mg via ORAL
  Filled 2013-04-15 (×2): qty 1

## 2013-04-15 MED ORDER — SODIUM CHLORIDE 0.9 % IR SOLN
Status: DC | PRN
  Administered 2013-04-15: 1000 mL

## 2013-04-15 MED ORDER — DOCUSATE SODIUM 100 MG PO CAPS
100.0000 mg | ORAL_CAPSULE | Freq: Two times a day (BID) | ORAL | Status: DC
  Administered 2013-04-15 – 2013-04-18 (×6): 100 mg via ORAL

## 2013-04-15 MED ORDER — FLEET ENEMA 7-19 GM/118ML RE ENEM: 1.0000 | ENEMA | Freq: Once | RECTAL | Status: AC | PRN

## 2013-04-15 MED ORDER — SODIUM CHLORIDE 0.9 % IV SOLN: INTRAVENOUS | Status: DC

## 2013-04-15 MED ORDER — BUPIVACAINE HCL (PF) 0.25 % IJ SOLN
INTRAMUSCULAR | Status: AC
Start: 2013-04-15 — End: 2013-04-15
  Filled 2013-04-15: qty 30

## 2013-04-15 MED ORDER — METOCLOPRAMIDE HCL 5 MG/ML IJ SOLN: 5.0000 mg | Freq: Three times a day (TID) | INTRAMUSCULAR | Status: DC | PRN

## 2013-04-15 MED ORDER — PHENOL 1.4 % MT LIQD
1.0000 | OROMUCOSAL | Status: DC | PRN
  Filled 2013-04-15: qty 177

## 2013-04-15 MED ORDER — TRAMADOL HCL 50 MG PO TABS: 50.0000 mg | ORAL_TABLET | Freq: Four times a day (QID) | ORAL | Status: DC | PRN

## 2013-04-15 MED ORDER — LATANOPROST 0.005 % OP SOLN
1.0000 [drp] | Freq: Every day | OPHTHALMIC | Status: DC
  Administered 2013-04-16 – 2013-04-18 (×3): 1 [drp] via OPHTHALMIC
  Filled 2013-04-15: qty 2.5

## 2013-04-15 MED ORDER — KETOROLAC TROMETHAMINE 15 MG/ML IJ SOLN
7.5000 mg | Freq: Four times a day (QID) | INTRAMUSCULAR | Status: AC | PRN
  Administered 2013-04-16: 01:00:00 7.5 mg via INTRAVENOUS
  Filled 2013-04-15: qty 1

## 2013-04-15 MED ORDER — RIVAROXABAN 10 MG PO TABS
10.0000 mg | ORAL_TABLET | Freq: Every day | ORAL | Status: DC
  Administered 2013-04-16 – 2013-04-18 (×3): 10 mg via ORAL
  Filled 2013-04-15 (×4): qty 1

## 2013-04-15 MED ORDER — ZOLPIDEM TARTRATE 5 MG PO TABS
2.5000 mg | ORAL_TABLET | Freq: Every evening | ORAL | Status: DC | PRN
  Administered 2013-04-16: 2.5 mg via ORAL
  Filled 2013-04-15: qty 1

## 2013-04-15 MED ORDER — METOCLOPRAMIDE HCL 10 MG PO TABS: 5.0000 mg | ORAL_TABLET | Freq: Three times a day (TID) | ORAL | Status: DC | PRN

## 2013-04-15 MED ORDER — HYDROMORPHONE HCL PF 1 MG/ML IJ SOLN: 0.2500 mg | INTRAMUSCULAR | Status: DC | PRN

## 2013-04-15 MED ORDER — ACETAMINOPHEN 500 MG PO TABS
1000.0000 mg | ORAL_TABLET | Freq: Once | ORAL | Status: AC
  Administered 2013-04-15: 1000 mg via ORAL
  Filled 2013-04-15: qty 2

## 2013-04-15 MED ORDER — BUPIVACAINE HCL 0.25 % IJ SOLN
INTRAMUSCULAR | Status: DC | PRN
  Administered 2013-04-15: 20 mL

## 2013-04-15 MED ORDER — CHLORHEXIDINE GLUCONATE 4 % EX LIQD: 60.0000 mL | Freq: Once | CUTANEOUS | Status: DC

## 2013-04-15 MED ORDER — DEXAMETHASONE SODIUM PHOSPHATE 10 MG/ML IJ SOLN
10.0000 mg | Freq: Every day | INTRAMUSCULAR | Status: AC
  Filled 2013-04-15: qty 1

## 2013-04-15 MED ORDER — ONDANSETRON HCL 4 MG/2ML IJ SOLN
INTRAMUSCULAR | Status: DC | PRN
  Administered 2013-04-15: 4 mg via INTRAVENOUS

## 2013-04-15 MED ORDER — ACETAMINOPHEN 500 MG PO TABS
1000.0000 mg | ORAL_TABLET | Freq: Four times a day (QID) | ORAL | Status: AC
  Administered 2013-04-15 – 2013-04-16 (×4): 1000 mg via ORAL
  Filled 2013-04-15 (×5): qty 2

## 2013-04-15 MED ORDER — ACETAMINOPHEN 650 MG RE SUPP: 650.0000 mg | Freq: Four times a day (QID) | RECTAL | Status: DC | PRN

## 2013-04-15 MED ORDER — FENTANYL CITRATE 0.05 MG/ML IJ SOLN
INTRAMUSCULAR | Status: DC | PRN
  Administered 2013-04-15: 50 ug via INTRAVENOUS

## 2013-04-15 MED ORDER — BUPIVACAINE IN DEXTROSE 0.75-8.25 % IT SOLN
INTRATHECAL | Status: DC | PRN
  Administered 2013-04-15: 2 mL via INTRATHECAL

## 2013-04-15 MED ORDER — PROPOFOL INFUSION 10 MG/ML OPTIME
INTRAVENOUS | Status: DC | PRN
  Administered 2013-04-15: 140 ug/kg/min via INTRAVENOUS

## 2013-04-15 MED ORDER — MORPHINE SULFATE 2 MG/ML IJ SOLN: 1.0000 mg | INTRAMUSCULAR | Status: DC | PRN

## 2013-04-15 MED ORDER — DEXTROSE-NACL 5-0.9 % IV SOLN
INTRAVENOUS | Status: DC
  Administered 2013-04-16: 06:00:00 via INTRAVENOUS

## 2013-04-15 MED ORDER — BUPIVACAINE LIPOSOME 1.3 % IJ SUSP
20.0000 mL | Freq: Once | INTRAMUSCULAR | Status: DC
  Filled 2013-04-15: qty 20

## 2013-04-15 MED ORDER — DIPHENHYDRAMINE HCL 12.5 MG/5ML PO ELIX: 12.5000 mg | ORAL_SOLUTION | ORAL | Status: DC | PRN

## 2013-04-15 MED ORDER — PROMETHAZINE HCL 25 MG/ML IJ SOLN: 6.2500 mg | INTRAMUSCULAR | Status: DC | PRN

## 2013-04-15 MED ORDER — DEXTROSE 5 % IV SOLN
500.0000 mg | Freq: Four times a day (QID) | INTRAVENOUS | Status: DC | PRN
  Administered 2013-04-15: 18:00:00 500 mg via INTRAVENOUS
  Filled 2013-04-15 (×2): qty 5

## 2013-04-15 MED ORDER — POLYETHYLENE GLYCOL 3350 17 G PO PACK
17.0000 g | PACK | Freq: Every day | ORAL | Status: DC | PRN
  Administered 2013-04-16 – 2013-04-17 (×2): 17 g via ORAL

## 2013-04-15 MED ORDER — LACTATED RINGERS IV SOLN
INTRAVENOUS | Status: DC | PRN
  Administered 2013-04-15 (×3): via INTRAVENOUS

## 2013-04-15 MED ORDER — OXYCODONE HCL 5 MG PO TABS
5.0000 mg | ORAL_TABLET | ORAL | Status: DC | PRN
  Administered 2013-04-15: 19:00:00 5 mg via ORAL
  Administered 2013-04-15: 22:00:00 10 mg via ORAL
  Administered 2013-04-15: 18:00:00 5 mg via ORAL
  Administered 2013-04-16 – 2013-04-18 (×9): 10 mg via ORAL
  Filled 2013-04-15 (×4): qty 2
  Filled 2013-04-15: qty 1
  Filled 2013-04-15 (×2): qty 2
  Filled 2013-04-15: qty 1
  Filled 2013-04-15 (×4): qty 2

## 2013-04-15 MED ORDER — DEXAMETHASONE SODIUM PHOSPHATE 10 MG/ML IJ SOLN
10.0000 mg | Freq: Once | INTRAMUSCULAR | Status: AC
  Administered 2013-04-15: 10 mg via INTRAVENOUS

## 2013-04-15 MED ORDER — VANCOMYCIN HCL IN DEXTROSE 1-5 GM/200ML-% IV SOLN
1000.0000 mg | Freq: Once | INTRAVENOUS | Status: AC
Start: 2013-04-16 — End: 2013-04-16
  Administered 2013-04-16: 01:00:00 1000 mg via INTRAVENOUS
  Filled 2013-04-15: qty 200

## 2013-04-15 MED ORDER — STERILE WATER FOR IRRIGATION IR SOLN
Status: DC | PRN
  Administered 2013-04-15: 3000 mL

## 2013-04-15 MED ORDER — KETAMINE HCL 10 MG/ML IJ SOLN
INTRAMUSCULAR | Status: DC | PRN
  Administered 2013-04-15: 20 mg via INTRAVENOUS

## 2013-04-15 MED ORDER — 0.9 % SODIUM CHLORIDE (POUR BTL) OPTIME
TOPICAL | Status: DC | PRN
  Administered 2013-04-15: 1000 mL

## 2013-04-15 MED ORDER — PHENYLEPHRINE HCL 10 MG/ML IJ SOLN
INTRAMUSCULAR | Status: DC | PRN
  Administered 2013-04-15: 120 ug via INTRAVENOUS

## 2013-04-15 MED ORDER — PROPOFOL 10 MG/ML IV BOLUS
INTRAVENOUS | Status: DC | PRN
  Administered 2013-04-15: 30 mg via INTRAVENOUS

## 2013-04-15 MED ORDER — DEXAMETHASONE 6 MG PO TABS
10.0000 mg | ORAL_TABLET | Freq: Every day | ORAL | Status: AC
  Administered 2013-04-16: 10 mg via ORAL
  Filled 2013-04-15: qty 1

## 2013-04-15 MED ORDER — TAMOXIFEN CITRATE 10 MG PO TABS
20.0000 mg | ORAL_TABLET | Freq: Every day | ORAL | Status: DC
Start: 2013-04-15 — End: 2013-04-18
  Administered 2013-04-16 – 2013-04-18 (×3): 20 mg via ORAL
  Filled 2013-04-15 (×4): qty 2

## 2013-04-15 MED ORDER — SODIUM CHLORIDE 0.9 % IJ SOLN
INTRAMUSCULAR | Status: DC | PRN
  Administered 2013-04-15: 15:00:00

## 2013-04-15 MED ORDER — VANCOMYCIN HCL IN DEXTROSE 1-5 GM/200ML-% IV SOLN
INTRAVENOUS | Status: AC
  Filled 2013-04-15: qty 200

## 2013-04-15 MED ORDER — METHOCARBAMOL 500 MG PO TABS
500.0000 mg | ORAL_TABLET | Freq: Four times a day (QID) | ORAL | Status: DC | PRN
  Administered 2013-04-16 – 2013-04-17 (×4): 500 mg via ORAL
  Filled 2013-04-15 (×4): qty 1

## 2013-04-15 MED ORDER — SODIUM CHLORIDE 0.9 % IJ SOLN
INTRAMUSCULAR | Status: AC
  Filled 2013-04-15: qty 50

## 2013-04-15 MED ORDER — ACETAMINOPHEN 325 MG PO TABS
650.0000 mg | ORAL_TABLET | Freq: Four times a day (QID) | ORAL | Status: DC | PRN
  Administered 2013-04-16 – 2013-04-18 (×2): 650 mg via ORAL
  Filled 2013-04-15 (×2): qty 2

## 2013-04-15 MED ORDER — TIMOLOL MALEATE 0.5 % OP SOLN
1.0000 [drp] | Freq: Two times a day (BID) | OPHTHALMIC | Status: DC
  Administered 2013-04-16 – 2013-04-18 (×5): 1 [drp] via OPHTHALMIC
  Filled 2013-04-15 (×2): qty 5

## 2013-04-15 MED ORDER — VANCOMYCIN HCL IN DEXTROSE 1-5 GM/200ML-% IV SOLN
1000.0000 mg | INTRAVENOUS | Status: AC
  Administered 2013-04-15: 1000 mg via INTRAVENOUS

## 2013-04-15 MED ORDER — ONDANSETRON HCL 4 MG/2ML IJ SOLN
4.0000 mg | Freq: Four times a day (QID) | INTRAMUSCULAR | Status: DC | PRN
  Administered 2013-04-16 (×2): 4 mg via INTRAVENOUS
  Filled 2013-04-15 (×2): qty 2

## 2013-04-15 SURGICAL SUPPLY — 56 items
BAG ZIPLOCK 12X15 (MISCELLANEOUS) ×2 IMPLANT
BANDAGE ELASTIC 6 VELCRO ST LF (GAUZE/BANDAGES/DRESSINGS) ×2 IMPLANT
BANDAGE ESMARK 6X9 LF (GAUZE/BANDAGES/DRESSINGS) ×1 IMPLANT
BLADE SAG 18X100X1.27 (BLADE) ×2 IMPLANT
BLADE SAW SGTL 11.0X1.19X90.0M (BLADE) ×2 IMPLANT
BNDG ESMARK 6X9 LF (GAUZE/BANDAGES/DRESSINGS) ×2
BOWL SMART MIX CTS (DISPOSABLE) ×2 IMPLANT
CAPT RP KNEE ×2 IMPLANT
CEMENT HV SMART SET (Cement) ×4 IMPLANT
CLOSURE STERI-STRIP 1/4X4 (GAUZE/BANDAGES/DRESSINGS) ×2 IMPLANT
CLOTH BEACON ORANGE TIMEOUT ST (SAFETY) ×2 IMPLANT
CUFF TOURN SGL QUICK 34 (TOURNIQUET CUFF) ×1
CUFF TRNQT CYL 34X4X40X1 (TOURNIQUET CUFF) ×1 IMPLANT
DECANTER SPIKE VIAL GLASS SM (MISCELLANEOUS) ×2 IMPLANT
DRAPE EXTREMITY T 121X128X90 (DRAPE) ×2 IMPLANT
DRAPE POUCH INSTRU U-SHP 10X18 (DRAPES) ×2 IMPLANT
DRAPE U-SHAPE 47X51 STRL (DRAPES) ×2 IMPLANT
DRSG ADAPTIC 3X8 NADH LF (GAUZE/BANDAGES/DRESSINGS) ×2 IMPLANT
DRSG PAD ABDOMINAL 8X10 ST (GAUZE/BANDAGES/DRESSINGS) ×2 IMPLANT
DURAPREP 26ML APPLICATOR (WOUND CARE) ×2 IMPLANT
ELECT REM PT RETURN 9FT ADLT (ELECTROSURGICAL) ×2
ELECTRODE REM PT RTRN 9FT ADLT (ELECTROSURGICAL) ×1 IMPLANT
EVACUATOR 1/8 PVC DRAIN (DRAIN) ×2 IMPLANT
FACESHIELD LNG OPTICON STERILE (SAFETY) ×10 IMPLANT
GLOVE BIO SURGEON STRL SZ7.5 (GLOVE) IMPLANT
GLOVE BIO SURGEON STRL SZ8 (GLOVE) IMPLANT
GLOVE BIOGEL PI IND STRL 8 (GLOVE) ×2 IMPLANT
GLOVE BIOGEL PI INDICATOR 8 (GLOVE) ×2
GLOVE SURG SS PI 6.5 STRL IVOR (GLOVE) IMPLANT
GOWN PREVENTION PLUS LG XLONG (DISPOSABLE) ×2 IMPLANT
GOWN STRL REIN XL XLG (GOWN DISPOSABLE) IMPLANT
HANDPIECE INTERPULSE COAX TIP (DISPOSABLE) ×1
IMMOBILIZER KNEE 20 (SOFTGOODS) ×2
IMMOBILIZER KNEE 20 THIGH 36 (SOFTGOODS) ×1 IMPLANT
KIT BASIN OR (CUSTOM PROCEDURE TRAY) ×2 IMPLANT
MANIFOLD NEPTUNE II (INSTRUMENTS) ×2 IMPLANT
NDL SAFETY ECLIPSE 18X1.5 (NEEDLE) ×2 IMPLANT
NEEDLE HYPO 18GX1.5 SHARP (NEEDLE) ×2
NS IRRIG 1000ML POUR BTL (IV SOLUTION) ×2 IMPLANT
PACK TOTAL JOINT (CUSTOM PROCEDURE TRAY) ×2 IMPLANT
PADDING CAST COTTON 6X4 STRL (CAST SUPPLIES) ×4 IMPLANT
POSITIONER SURGICAL ARM (MISCELLANEOUS) ×2 IMPLANT
SET HNDPC FAN SPRY TIP SCT (DISPOSABLE) ×1 IMPLANT
SPONGE GAUZE 4X4 12PLY (GAUZE/BANDAGES/DRESSINGS) ×2 IMPLANT
STRIP CLOSURE SKIN 1/2X4 (GAUZE/BANDAGES/DRESSINGS) ×4 IMPLANT
SUCTION FRAZIER 12FR DISP (SUCTIONS) ×2 IMPLANT
SUT MNCRL AB 4-0 PS2 18 (SUTURE) ×2 IMPLANT
SUT VIC AB 2-0 CT1 27 (SUTURE) ×3
SUT VIC AB 2-0 CT1 TAPERPNT 27 (SUTURE) ×3 IMPLANT
SUT VLOC 180 0 24IN GS25 (SUTURE) ×2 IMPLANT
SYR 20CC LL (SYRINGE) ×2 IMPLANT
SYR 50ML LL SCALE MARK (SYRINGE) ×2 IMPLANT
TOWEL OR 17X26 10 PK STRL BLUE (TOWEL DISPOSABLE) ×4 IMPLANT
TRAY FOLEY CATH 14FRSI W/METER (CATHETERS) ×2 IMPLANT
WATER STERILE IRR 1500ML POUR (IV SOLUTION) ×2 IMPLANT
WRAP KNEE MAXI GEL POST OP (GAUZE/BANDAGES/DRESSINGS) ×2 IMPLANT

## 2013-04-15 NOTE — Preoperative (Signed)
Beta Blockers   Reason not to administer Beta Blockers:Not Applicable 

## 2013-04-15 NOTE — Transfer of Care (Signed)
Immediate Anesthesia Transfer of Care Note  Patient: Dawn Diaz  Procedure(s) Performed: Procedure(s): RIGHT TOTAL KNEE ARTHROPLASTY (Right)  Patient Location: PACU  Anesthesia Type:Spinal  Level of Consciousness: awake, alert , oriented and patient cooperative  Airway & Oxygen Therapy: Patient Spontanous Breathing and Patient connected to face mask oxygen  Post-op Assessment: Report given to PACU RN and Post -op Vital signs reviewed and stable  Post vital signs: stable  Complications: No apparent anesthesia complications  T11 spinal level

## 2013-04-15 NOTE — Anesthesia Procedure Notes (Signed)
Spinal  Patient location during procedure: OR Staffing Anesthesiologist: Crislyn Willbanks J Performed by: anesthesiologist  Preanesthetic Checklist Completed: patient identified, site marked, surgical consent, pre-op evaluation, timeout performed, IV checked, risks and benefits discussed and monitors and equipment checked Spinal Block Patient position: sitting Prep: Betadine Patient monitoring: heart rate, continuous pulse ox and blood pressure Location: L3-4 Injection technique: single-shot Needle Needle type: Spinocan  Needle gauge: 22 G Needle length: 9 cm Additional Notes Expiration date of kit checked and confirmed. Patient tolerated procedure well, without complications. CSF clear. No paresthesia.     

## 2013-04-15 NOTE — Op Note (Signed)
Pre-operative diagnosis- Osteoarthritis  Right knee(s)  Post-operative diagnosis- Osteoarthritis Right knee(s)  Procedure-  Right  Total Knee Arthroplasty  Surgeon- Gus Rankin. Kriston Mckinnie, MD  Assistant- Avel Peace, PA-C   Anesthesia-  Spinal EBL-* No blood loss amount entered *  Drains Hemovac  Tourniquet time-  Total Tourniquet Time Documented: Thigh (Right) - 40 minutes Total: Thigh (Right) - 40 minutes    Complications- None  Condition-PACU - hemodynamically stable.   Brief Clinical Note  Dawn Diaz is a 79 y.o. year old female with end stage OA of her right knee with progressively worsening pain and dysfunction. She has constant pain, with activity and at rest and significant functional deficits with difficulties even with ADLs. She has had extensive non-op management including analgesics, injections of cortisone and viscosupplements, and home exercise program, but remains in significant pain with significant dysfunction.Radiographs show bone on bone arthritis medial and patellofemoral. She presents now for right Total Knee Arthroplasty.    Procedure in detail---   The patient is brought into the operating room and positioned supine on the operating table. After successful administration of  Spinal,   a tourniquet is placed high on the  Right thigh(s) and the lower extremity is prepped and draped in the usual sterile fashion. Time out is performed by the operating team and then the  Right lower extremity is wrapped in Esmarch, knee flexed and the tourniquet inflated to 300 mmHg.       A midline incision is made with a ten blade through the subcutaneous tissue to the level of the extensor mechanism. A fresh blade is used to make a medial parapatellar arthrotomy. Soft tissue over the proximal medial tibia is subperiosteally elevated to the joint line with a knife and into the semimembranosus bursa with a Cobb elevator. Soft tissue over the proximal lateral tibia is elevated with  attention being paid to avoiding the patellar tendon on the tibial tubercle. The patella is everted, knee flexed 90 degrees and the ACL and PCL are removed. Findings are bone on bone medial and patellofemoral with large medial and patellar osteophytes.        The drill is used to create a starting hole in the distal femur and the canal is thoroughly irrigated with sterile saline to remove the fatty contents. The 5 degree Right  valgus alignment guide is placed into the femoral canal and the distal femoral cutting block is pinned to remove 10 mm off the distal femur. Resection is made with an oscillating saw.      The tibia is subluxed forward and the menisci are removed. The extramedullary alignment guide is placed referencing proximally at the medial aspect of the tibial tubercle and distally along the second metatarsal axis and tibial crest. The block is pinned to remove 2mm off the more deficient medial  side. Resection is made with an oscillating saw. Size 3is the most appropriate size for the tibia and the proximal tibia is prepared with the modular drill and keel punch for that size.      The femoral sizing guide is placed and size 4 narrow is most appropriate. Rotation is marked off the epicondylar axis and confirmed by creating a rectangular flexion gap at 90 degrees. The size 4 cutting block is pinned in this rotation and the anterior, posterior and chamfer cuts are made with the oscillating saw. The intercondylar block is then placed and that cut is made.      Trial size 3 tibial component, trial  size 4 narrow posterior stabilized femur and a 12.5  mm posterior stabilized rotating platform insert trial is placed. Full extension is achieved with excellent varus/valgus and anterior/posterior balance throughout full range of motion. The patella is everted and thickness measured to be 24  mm. Free hand resection is taken to 14 mm, a 35 template is placed, lug holes are drilled, trial patella is placed, and  it tracks normally. Osteophytes are removed off the posterior femur with the trial in place. All trials are removed and the cut bone surfaces prepared with pulsatile lavage. Cement is mixed and once ready for implantation, the size 3 tibial implant, size  4 narrow posterior stabilized femoral component, and the size 35 patella are cemented in place and the patella is held with the clamp. The trial insert is placed and the knee held in full extension. The Exparel (20 ml mixed with 30 ml saline) and .25% Bupivicaine, are injected into the extensor mechanism, posterior capsule, medial and lateral gutters and subcutaneous tissues.  All extruded cement is removed and once the cement is hard the permanent 12.5 mm posterior stabilized rotating platform insert is placed into the tibial tray.      The wound is copiously irrigated with saline solution and the extensor mechanism closed over a hemovac drain with #1 PDS suture. The tourniquet is released for a total tourniquet time of 39  minutes. Flexion against gravity is 140 degrees and the patella tracks normally. Subcutaneous tissue is closed with 2.0 vicryl and subcuticular with running 4.0 Monocryl. The incision is cleaned and dried and steri-strips and a bulky sterile dressing are applied. The limb is placed into a knee immobilizer and the patient is awakened and transported to recovery in stable condition.      Please note that a surgical assistant was a medical necessity for this procedure in order to perform it in a safe and expeditious manner. Surgical assistant was necessary to retract the ligaments and vital neurovascular structures to prevent injury to them and also necessary for proper positioning of the limb to allow for anatomic placement of the prosthesis.   Gus Rankin Morad Tal, MD    04/15/2013, 3:24 PM

## 2013-04-15 NOTE — Anesthesia Preprocedure Evaluation (Addendum)
Anesthesia Evaluation  Patient identified by MRN, date of birth, ID band Patient awake    Reviewed: Allergy & Precautions, H&P , NPO status , Patient's Chart, lab work & pertinent test results  Airway Mallampati: II TM Distance: >3 FB Neck ROM: Full    Dental no notable dental hx.    Pulmonary neg pulmonary ROS,  breath sounds clear to auscultation  Pulmonary exam normal       Cardiovascular negative cardio ROS  Rhythm:Regular Rate:Normal     Neuro/Psych PSYCHIATRIC DISORDERS Depression  Neuromuscular disease    GI/Hepatic Neg liver ROS, hiatal hernia, GERD-  ,  Endo/Other  negative endocrine ROS  Renal/GU negative Renal ROS  negative genitourinary   Musculoskeletal  (+) Arthritis -, Rheumatoid disorders,    Abdominal   Peds negative pediatric ROS (+)  Hematology negative hematology ROS (+)   Anesthesia Other Findings   Reproductive/Obstetrics negative OB ROS                           Anesthesia Physical Anesthesia Plan  ASA: II  Anesthesia Plan: Spinal   Post-op Pain Management:    Induction: Intravenous  Airway Management Planned:   Additional Equipment:   Intra-op Plan:   Post-operative Plan: Extubation in OR  Informed Consent: I have reviewed the patients History and Physical, chart, labs and discussed the procedure including the risks, benefits and alternatives for the proposed anesthesia with the patient or authorized representative who has indicated his/her understanding and acceptance.   Dental advisory given  Plan Discussed with: CRNA  Anesthesia Plan Comments: (Discussed r/b general versus spinal. Discussed risks/benefits of spinal including headache, backache, failure, bleeding, infection, and nerve damage. Patient consents to spinal. Questions answered. Coagulation studies and platelet count acceptable.)      Anesthesia Quick Evaluation

## 2013-04-15 NOTE — H&P (View-Only) (Signed)
Dawn Diaz  DOB: 12/17/1926 Married / Language: English / Race: White Female  Date of Admission:  04/15/2013  Chief Complaint:  Right Knee Pain  History of Present Illness The patient is a 79 year old female who comes in for a preoperative History and Physical. The patient is scheduled for a right total knee arthroplasty to be performed by Dr. Frank V. Aluisio, MD at [facility] on 04/15/2013. The patient is a 79 year old female who presents for follow up of their knee. The patient is being followed for their bilateral knee pain and osteoarthritis. They are now 1 year(s) out from last visit. Symptoms reported today include: pain and swelling. The patient feels that they are doing poorly and report their pain level to be moderate. Note for "Follow-up Knee": Patient states that her right knee is worse. She has had to limit her walking and swimming because of her knee. Dawn Diaz said that her knee is getting progressively worse over time. The right knee is worse than the left although both are painful. This limits what she can and can not do. She has had injections in the past without benefit. She feels her knee is having a very negative affect on her lifestyle. She is to the point she would like a more permanent solution for this. She is ready to proceed with knee surgery. They have been treated conservatively in the past for the above stated problem and despite conservative measures, they continue to have progressive pain and severe functional limitations and dysfunction. They have failed non-operative management including home exercise, medications, and injections. It is felt that they would benefit from undergoing total joint replacement. Risks and benefits of the procedure have been discussed with the patient and they elect to proceed with surgery. There are no active contraindications to surgery such as ongoing infection or rapidly progressive neurological  disease.    Problem List Osteoarthritis, knee (715.96)    Allergies Amoxicillin *PENICILLINS*. Hives. Latex. Itching.    Family History Cancer. mother, father and sister Diabetes Mellitus. father    Social History Exercise. does running / walking and other Illicit drug use. no Children. 3 Current work status. retired Marital status. married Number of flights of stairs before winded. 2-3 Pain Contract. no Alcohol use. never consumed alcohol Post-Surgical Plans. Plan is for [facility]    Medication History Tamoxifen Citrate (20MG Tablet, Oral) Active. Aspirin EC (81MG Tablet DR, Oral) Active. Vitamin D3 (1000UNIT Tablet, Oral) Active. Probiotic ( Oral) Active. Hydroxychloroquine Sulfate (200MG Tablet, Oral) Active. Timolol Maleate ( Ophthalmic) Specific dose unknown - Active. Ambien (5MG Tablet, Oral) Active.    Past Surgical History Foot Surgery. bilateral Mastectomy. right Breast Reconstruction. right Cataract Surgery. left   Medical History Rheumatoid Arthritis Diverticulitis Of Colon Anxiety Disorder Glaucoma Cataract Irritable bowel syndrome Diverticulosis Urinary Incontinence Measles Mumps Breast cancer    Review of Systems General:Not Present- Chills, Fever, Night Sweats, Fatigue, Weight Gain, Weight Loss and Memory Loss. Skin:Not Present- Hives, Itching, Rash, Eczema and Lesions. HEENT:Not Present- Tinnitus, Headache, Double Vision, Visual Loss, Hearing Loss and Dentures. Respiratory:Not Present- Shortness of breath with exertion, Shortness of breath at rest, Allergies, Coughing up blood and Chronic Cough. Cardiovascular:Not Present- Chest Pain, Racing/skipping heartbeats, Difficulty Breathing Lying Down, Murmur, Swelling and Palpitations. Gastrointestinal:Present- Abdominal Pain. Not Present- Bloody Stool, Heartburn, Vomiting, Nausea, Constipation, Diarrhea, Difficulty Swallowing, Jaundice and Loss  of appetitie. Female Genitourinary:Present- Urinary Retention (during the day) and Urinating at Night. Not Present- Blood in Urine, Urinary   frequency, Weak urinary stream, Discharge, Flank Pain, Incontinence, Painful Urination and Urgency. Musculoskeletal:Present- Morning Stiffness. Not Present- Muscle Weakness, Muscle Pain, Joint Swelling, Joint Pain, Back Pain and Spasms. Neurological:Not Present- Tremor, Dizziness, Blackout spells, Paralysis, Difficulty with balance and Weakness. Psychiatric:Not Present- Insomnia.    Vitals Weight: 130 lb Height: 68 in Weight was reported by patient. Height was reported by patient. Body Surface Area: 1.68 m Body Mass Index: 19.77 kg/m Pulse: 76 (Regular) Resp.: 14 (Unlabored) BP: 124/68 (Sitting, Left Arm, Standard)     Physical Exam The physical exam findings are as follows:   General Mental Status - Alert, cooperative and good historian. General Appearance- pleasant. Not in acute distress. Orientation- Oriented X3. Build & Nutrition- Well nourished and Well developed.   Head and Neck Head- normocephalic, atraumatic . Neck Global Assessment- supple. no bruit auscultated on the right and no bruit auscultated on the left.   Eye Vision- Wears corrective lenses. Pupil- Bilateral- Regular and Round. Motion- Bilateral- EOMI.   Chest and Lung Exam Auscultation: Breath sounds:- clear at anterior chest wall and - clear at posterior chest wall. Adventitious sounds:- No Adventitious sounds.   Cardiovascular Auscultation:Rhythm- Regular rate and rhythm. Heart Sounds- S1 WNL and S2 WNL. Murmurs & Other Heart Sounds:Auscultation of the heart reveals - No Murmurs.   Abdomen Palpation/Percussion:Tenderness- Abdomen is non-tender to palpation. Rigidity (guarding)- Abdomen is soft. Auscultation:Auscultation of the abdomen reveals - Bowel sounds normal.   Female Genitourinary Not done, not  pertinent to present illness  Musculoskeletal She is a very pleasant , well developed female. She is alert and oriented. No apparent distress. Evaluation of her hips show a normal range of motion. No discomfort. The left knee shows no effusion. Range is 0-135. There is slight crepitus on range of motion. No specific joint line tenderness or instability. The right knee shows no effusion. There is marked crepitus on range of motion of the right knee. Range is 5-125. There is a slight varus. Tenderness, medial greater than lateral with no instability noted. Pulse, sensation and motor intact both lower extremities. Antalgic gait on the right.  RADIOGRAPHS: AP of both knees and lateral show a bone on bone change in he medial and patellofemoral compartments of the right knee. The left knee has some arthritic changes also but not as bad as the right.   Assessment & Plan Osteoarthritis, knee (715.96) Impression: Right Knee  Note: Plan is for a Right Total Knee Replacement by Dr. Aluisio.  Plan is to go to [facility]  PCP - Dr. John Russo - Patient has been seen preoperatively and felt to be stable for surgery.  The patient will not receive TXA (tranexamic acid) due to: Breast Cancer  Signed electronically by Alexzandrew L Perkins, III PA-C 

## 2013-04-15 NOTE — Interval H&P Note (Signed)
History and Physical Interval Note:  04/15/2013 10:58 AM  Dawn Diaz  has presented today for surgery, with the diagnosis of Right Knee osteoarthritis  The various methods of treatment have been discussed with the patient and family. After consideration of risks, benefits and other options for treatment, the patient has consented to  Procedure(s): RIGHT TOTAL KNEE ARTHROPLASTY (Right) as a surgical intervention .  The patient's history has been reviewed, patient examined, no change in status, stable for surgery.  I have reviewed the patient's chart and labs.  Questions were answered to the patient's satisfaction.     Loanne Drilling

## 2013-04-15 NOTE — Plan of Care (Signed)
Problem: Consults Goal: Diagnosis- Total Joint Replacement Primary Total Knee     

## 2013-04-15 NOTE — Anesthesia Postprocedure Evaluation (Signed)
  Anesthesia Post-op Note  Patient: Dawn Diaz  Procedure(s) Performed: Procedure(s) (LRB): RIGHT TOTAL KNEE ARTHROPLASTY (Right)  Patient Location: PACU  Anesthesia Type: Spinal  Level of Consciousness: awake and alert   Airway and Oxygen Therapy: Patient Spontanous Breathing  Post-op Pain: mild  Post-op Assessment: Post-op Vital signs reviewed, Patient's Cardiovascular Status Stable, Respiratory Function Stable, Patent Airway and No signs of Nausea or vomiting  Last Vitals:  Filed Vitals:   04/15/13 1816  BP: 151/73  Pulse: 74  Temp: 36.7 C  Resp: 16    Post-op Vital Signs: stable   Complications: No apparent anesthesia complications

## 2013-04-16 ENCOUNTER — Encounter (HOSPITAL_COMMUNITY): Payer: Self-pay | Admitting: Orthopedic Surgery

## 2013-04-16 LAB — BASIC METABOLIC PANEL
CO2: 23 mEq/L (ref 19–32)
Calcium: 8.5 mg/dL (ref 8.4–10.5)
Creatinine, Ser: 0.71 mg/dL (ref 0.50–1.10)
GFR calc non Af Amer: 78 mL/min — ABNORMAL LOW (ref >90)
Glucose, Bld: 144 mg/dL — ABNORMAL HIGH (ref 70–99)

## 2013-04-16 LAB — CBC
HCT: 35.9 % — ABNORMAL LOW (ref 36.0–46.0)
Hemoglobin: 12.2 g/dL (ref 12.0–15.0)
MCH: 30.1 pg (ref 26.0–34.0)
MCHC: 34 g/dL (ref 30.0–36.0)
MCV: 88.6 fL (ref 78.0–100.0)
Platelets: 197 10*3/uL (ref 150–400)
RBC: 4.05 MIL/uL (ref 3.87–5.11)

## 2013-04-16 NOTE — Progress Notes (Signed)
   Subjective: 1 Day Post-Op Procedure(s) (LRB): RIGHT TOTAL KNEE ARTHROPLASTY (Right) Patient reports pain as mild.   Patient seen in rounds with Dr. Lequita Halt.  Will leave foley in until after first therapy session.   Patient is well, and has had no acute complaints or problems We will start therapy today.  Plan is to go to [facility] after hospital stay.  Objective: Vital signs in last 24 hours: Temp:  [97.2 F (36.2 C)-98.8 F (37.1 C)] 98.8 F (37.1 C) (10/28 0502) Pulse Rate:  [59-88] 77 (10/28 0502) Resp:  [11-16] 14 (10/28 0502) BP: (119-169)/(54-77) 136/71 mmHg (10/28 0502) SpO2:  [97 %-100 %] 97 % (10/28 0502) Weight:  [61.7 kg (136 lb 0.4 oz)] 61.7 kg (136 lb 0.4 oz) (10/27 1700)  Intake/Output from previous day:  Intake/Output Summary (Last 24 hours) at 04/16/13 0804 Last data filed at 04/16/13 0600  Gross per 24 hour  Intake   3415 ml  Output   4095 ml  Net   -680 ml    Intake/Output this shift:    Labs:  Recent Labs  04/16/13 0450  HGB 12.2    Recent Labs  04/16/13 0450  WBC 11.0*  RBC 4.05  HCT 35.9*  PLT 197    Recent Labs  04/16/13 0450  NA 135  K 3.8  CL 102  CO2 23  BUN 12  CREATININE 0.71  GLUCOSE 144*  CALCIUM 8.5   No results found for this basename: LABPT, INR,  in the last 72 hours  EXAM General - Patient is Alert, Appropriate and Oriented Extremity - Neurovascular intact Sensation intact distally Dorsiflexion/Plantar flexion intact Dressing - dressing C/D/I Motor Function - intact, moving foot and toes well on exam.  Hemovac pulled without difficulty.  Past Medical History  Diagnosis Date  . Irritable bowel syndrome   . Diverticulitis   . GERD (gastroesophageal reflux disease)   . Ovarian cyst   . Bunion   . Osteoporosis   . Anxiety disorder   . Depression   . Hiatal hernia   . Glaucoma   . Bacterial overgrowth syndrome   . Breast cancer, stage 1     '97/recurrent '05 right breast(s/p mastectomy)  .  History of TIAs     - 13 yrs ago"brief memory loss"    Assessment/Plan: 1 Day Post-Op Procedure(s) (LRB): RIGHT TOTAL KNEE ARTHROPLASTY (Right) Principal Problem:   OA (osteoarthritis) of knee  Estimated body mass index is 20.38 kg/(m^2) as calculated from the following:   Height as of this encounter: 5' 8.5" (1.74 m).   Weight as of this encounter: 61.7 kg (136 lb 0.4 oz). Up with therapy Discharge to SNF  DVT Prophylaxis - Xarelto Weight-Bearing as tolerated to right leg D/C O2 and Pulse OX and try on Room Air  Diane Mochizuki 04/16/2013, 8:04 AM

## 2013-04-16 NOTE — Progress Notes (Signed)
Physical Therapy Treatment Patient Details Name: Dawn Diaz MRN: [id number] DOB: 1927/03/21 Today's Date: 04/16/2013 Time: 4098-1191 PT Time Calculation (min): 11 min  PT Assessment / Plan / Recommendation  History of Present Illness RTKA   PT Comments   Pt tolerated ambulation better. Plans for snf. Cues for safety and precautions.  Follow Up Recommendations  SNF     Does the patient have the potential to tolerate intense rehabilitation     Barriers to Discharge Decreased caregiver support      Equipment Recommendations  Rolling walker with 5" wheels    Recommendations for Other Services    Frequency 7X/week   Progress towards PT Goals Progress towards PT goals: Progressing toward goals  Plan Current plan remains appropriate    Precautions / Restrictions Precautions Precautions: Fall Precaution Comments: confusion at times. Required Braces or Orthoses: Knee Immobilizer - Right   Pertinent Vitals/Pain Pain < 4 R knee    Mobility  Bed Mobility Bed Mobility: Sit to Supine Supine to Sit: 4: Min assist Sit to Supine: 4: Min assist Details for Bed Mobility Assistance: assist of R leg onto bed. Transfers Transfers: Sit to Stand Sit to Stand: 4: Min assist;From elevated surface;From chair/3-in-1 Stand to Sit: 4: Min assist;With armrests;To bed Details for Transfer Assistance: multimodal cueas for safe use of RW and technique, UE use, Ambulation/Gait Ambulation/Gait Assistance: 3: Mod assist Ambulation Distance (Feet): 20 Feet Assistive device: Rolling walker Ambulation/Gait Assistance Details: became dizzy and had to sit down./ Gait Pattern: Step-to pattern;Antalgic    Exercises Total Joint Exercises Ankle Circles/Pumps: AROM;Both;10 reps Quad Sets: AROM;Right Heel Slides: AAROM;Right;10 reps Straight Leg Raises: AAROM;Right;10 reps Goniometric ROM: 10-30 R knee   PT Diagnosis: Difficulty walking;Acute pain  PT Problem List: Decreased strength;Decreased  range of motion;Decreased activity tolerance;Decreased mobility;Cardiopulmonary status limiting activity;Decreased knowledge of precautions;Decreased safety awareness;Decreased knowledge of use of DME;Pain PT Treatment Interventions: DME instruction;Gait training;Functional mobility training;Therapeutic activities;Therapeutic exercise;Patient/family education   PT Goals (current goals can now be found in the care plan section) Acute Rehab PT Goals Patient Stated Goal: I want to walk. PT Goal Formulation: With patient Time For Goal Achievement: 04/23/13 Potential to Achieve Goals: Good  Visit Information  Last PT Received On: 04/16/13 Assistance Needed: +2 History of Present Illness: RTKA    Subjective Data  Patient Stated Goal: I want to walk.   Cognition  Cognition Arousal/Alertness: Awake/alert Behavior During Therapy: WFL for tasks assessed/performed Overall Cognitive Status: Difficult to assess (appeared confused at times, required frquent redirection.) Memory: Decreased recall of precautions    Balance     End of Session PT - End of Session Activity Tolerance: Patient tolerated treatment well Patient left: in bed;with call bell/phone within reach;with bed alarm set Nurse Communication: Mobility status CPM Right Knee CPM Right Knee: On   GP     [name] 04/16/2013, 2:41 PM

## 2013-04-16 NOTE — Progress Notes (Signed)
Pt. Would like to discuss keeping the foley in until after first session of PT with Dr. Lequita Halt because of incontinence issues. Will let Dr. Lequita Halt know during rounds in am.

## 2013-04-16 NOTE — Progress Notes (Signed)
Clinical Social Work Department CLINICAL SOCIAL WORK PLACEMENT NOTE 04/16/2013  Patient:  Dawn Diaz, Dawn Diaz  Account Number:  [id number] Admit date:  04/15/2013  Clinical Social Worker:  Cori Razor, LCSW  Date/time:  04/16/2013 02:29 PM  Clinical Social Work is seeking post-discharge placement for this patient at the following level of care:   SKILLED NURSING   (*CSW will update this form in Epic as items are completed)   04/16/2013  Patient/family provided with [facility] Department of Clinical Social Work's list of facilities offering this level of care within the geographic area requested by the patient (or if unable, by the patient's family).  04/16/2013  Patient/family informed of their freedom to choose among providers that offer the needed level of care, that participate in Medicare, Medicaid or managed care program needed by the patient, have an available bed and are willing to accept the patient.  04/16/2013  Patient/family informed of [facility]' ownership interest in [facility], as well as of the fact that they are under no obligation to receive care at this facility.  PASARR submitted to EDS on 04/16/2013 PASARR number received from EDS on 04/16/2013  FL2 transmitted to all facilities in geographic area requested by pt/family on  04/16/2013 FL2 transmitted to all facilities within larger geographic area on   Patient informed that his/her managed care company has contracts with or will negotiate with  certain facilities, including the following:     Patient/family informed of bed offers received:  04/16/2013 Patient chooses bed at [facility] Physician recommends and patient chooses bed at    Patient to be transferred to [facility] on   Patient to be transferred to facility by   The following physician request were entered in Epic:   Additional Comments:  Cori Razor LCSW [phone]

## 2013-04-16 NOTE — Progress Notes (Signed)
Utilization review completed.  

## 2013-04-16 NOTE — Progress Notes (Signed)
Care of pt assumed at this time.  Agree with previously charted assessments.  Pt resting comfortably in bed, no complaints.  Plan of care reviewed with pt.  Will monitor. Ardyth Gal, RN 04/16/2013

## 2013-04-16 NOTE — Evaluation (Signed)
Physical Therapy Evaluation Patient Details Name: Dawn Diaz MRN: [id number] DOB: 12/31/1926 Today's Date: 04/16/2013 Time: 8295-6213 PT Time Calculation (min): 45 min  PT Assessment / Plan / Recommendation History of Present Illness  RTKA  Clinical Impression  Pt c/o dizziness after standing. BP 145/73. Pt will benefit fromPT to address problems listed. Pt plans SNF.    PT Assessment  Patient needs continued PT services    Follow Up Recommendations  SNF    Does the patient have the potential to tolerate intense rehabilitation      Barriers to Discharge Decreased caregiver support      Equipment Recommendations  Rolling walker with 5" wheels    Recommendations for Other Services     Frequency 7X/week    Precautions / Restrictions Precautions Precautions: Fall;Knee Precaution Comments: dizzy on eval Required Braces or Orthoses: Knee Immobilizer - Right   Pertinent Vitals/Pain 5 R knee.      Mobility  Bed Mobility Bed Mobility: Supine to Sit Supine to Sit: 4: Min assist Details for Bed Mobility Assistance: cues for technique. Transfers Transfers: Sit to Stand;Stand to Sit Sit to Stand: 4: Min assist;From bed;From elevated surface Stand to Sit: 4: Min assist;To chair/3-in-1;With armrests Details for Transfer Assistance: pt became dizzy and had to sit down. cues for sequence and use of UE's. R leg position prior to sitting down. Ambulation/Gait Ambulation/Gait Assistance: 3: Mod assist Ambulation Distance (Feet): 8 Feet Assistive device: Rolling walker Ambulation/Gait Assistance Details: became dizzy and had to sit down./ Gait Pattern: Step-to pattern;Antalgic    Exercises Total Joint Exercises Ankle Circles/Pumps: AROM;Both;10 reps Quad Sets: AROM;Right Heel Slides: AAROM;Right;10 reps Straight Leg Raises: AAROM;Right;10 reps Goniometric ROM: 10-30 R knee   PT Diagnosis: Difficulty walking;Acute pain  PT Problem List: Decreased strength;Decreased  range of motion;Decreased activity tolerance;Decreased mobility;Cardiopulmonary status limiting activity;Decreased knowledge of precautions;Decreased safety awareness;Decreased knowledge of use of DME;Pain PT Treatment Interventions: DME instruction;Gait training;Functional mobility training;Therapeutic activities;Therapeutic exercise;Patient/family education     PT Goals(Current goals can be found in the care plan section) Acute Rehab PT Goals Patient Stated Goal: I want to walk. PT Goal Formulation: With patient Time For Goal Achievement: 04/23/13 Potential to Achieve Goals: Good  Visit Information  Last PT Received On: 04/16/13 Assistance Needed: +2 History of Present Illness: RTKA       Prior Functioning  Home Living Family/patient expects to be discharged to:: Skilled nursing facility Home Equipment: None Prior Function Level of Independence: Independent Communication Communication: No difficulties    Cognition  Cognition Arousal/Alertness: Awake/alert Behavior During Therapy: WFL for tasks assessed/performed Overall Cognitive Status: Difficult to assess (appeared confused at times, required frquent redirection.) Memory: Decreased recall of precautions    Extremity/Trunk Assessment Upper Extremity Assessment Upper Extremity Assessment: Overall WFL for tasks assessed Lower Extremity Assessment Lower Extremity Assessment: RLE deficits/detail RLE Deficits / Details: requires assist for SLR. knee flexion 30 degrees.   Balance    End of Session PT - End of Session Activity Tolerance: Treatment limited secondary to medical complications (Comment) (dizziness.) Patient left: in chair;with call bell/phone within reach Nurse Communication: Mobility status and dizziness  GP     [name] 04/16/2013, 2:34 PM Blanchard Kelch PT [phone]

## 2013-04-16 NOTE — Progress Notes (Signed)
Clinical Social Work Department BRIEF PSYCHOSOCIAL ASSESSMENT 04/16/2013  Patient:  REAGANN, DOLCE     Account Number:  [id number]     Admit date:  04/15/2013  Clinical Social Worker:  Candie Chroman  Date/Time:  04/16/2013 02:10 PM  Referred by:  Physician  Date Referred:  04/16/2013 Referred for  SNF Placement   Other Referral:   Interview type:  Patient Other interview type:    PSYCHOSOCIAL DATA Living Status:  HUSBAND Admitted from facility:   Level of care:   Primary support name:  Elder Cyphers Primary support relationship to patient:  SPOUSE Degree of support available:   unclear    CURRENT CONCERNS Current Concerns  Post-Acute Placement   Other Concerns:    SOCIAL WORK ASSESSMENT / PLAN Pt is an 79 yr old female living at home prior to hospitalization . CSW met with pt to assist with d/c planning. Pt has made prior arrangements to have ST SNF following hospital d/c. CSW has contacted SNF and d/c plan has  been confirmed. CSW will continue to follow to assist with d/c planning to SNF.   Assessment/plan status:  Psychosocial Support/Ongoing Assessment of Needs Other assessment/ plan:   Information/referral to community resources:   Insurance coverage for SNF and ambulance transport reviewed.    PATIENT'S/FAMILY'S RESPONSE TO PLAN OF CARE: Pt is looking forward to having rehab at [facility].   Cori Razor LCSW [phone]

## 2013-04-16 NOTE — Progress Notes (Signed)
OT Cancellation Note  Patient Details Name: Dawn Diaz MRN: [id number] DOB: 01-07-1927   Cancelled Treatment:     Pt plans snf for rehab.  Will defer OT eval to that venue.  [address], OTR/L [phone] 04/16/2013 04/16/2013, 12:44 PM

## 2013-04-17 LAB — BASIC METABOLIC PANEL
CO2: 28 mEq/L (ref 19–32)
Calcium: 8.3 mg/dL — ABNORMAL LOW (ref 8.4–10.5)
Chloride: 105 mEq/L (ref 96–112)
Creatinine, Ser: 0.88 mg/dL (ref 0.50–1.10)
Glucose, Bld: 108 mg/dL — ABNORMAL HIGH (ref 70–99)
Potassium: 3.6 mEq/L (ref 3.5–5.1)

## 2013-04-17 LAB — CBC
HCT: 31.2 % — ABNORMAL LOW (ref 36.0–46.0)
Hemoglobin: 10.5 g/dL — ABNORMAL LOW (ref 12.0–15.0)
MCH: 29.9 pg (ref 26.0–34.0)
MCHC: 33.7 g/dL (ref 30.0–36.0)
MCV: 88.9 fL (ref 78.0–100.0)
Platelets: 187 10*3/uL (ref 150–400)
RBC: 3.51 MIL/uL — ABNORMAL LOW (ref 3.87–5.11)
WBC: 9.2 10*3/uL (ref 4.0–10.5)

## 2013-04-17 NOTE — Progress Notes (Signed)
   Subjective: 2 Days Post-Op Procedure(s) (LRB): RIGHT TOTAL KNEE ARTHROPLASTY (Right) Patient reports pain as mild.   Patient seen in rounds with Dr. Lequita Halt. Patient is well, and has had no acute complaints or problems Plan is to go Skilled nursing facility after hospital stay.  Objective: Vital signs in last 24 hours: Temp:  [99.4 F (37.4 C)-99.8 F (37.7 C)] 99.5 F (37.5 C) (10/29 0444) Pulse Rate:  [67-73] 67 (10/29 0444) Resp:  [16-20] 18 (10/29 0444) BP: (113-131)/(63-69) 113/63 mmHg (10/29 0444) SpO2:  [96 %-98 %] 97 % (10/29 0444)  Intake/Output from previous day:  Intake/Output Summary (Last 24 hours) at 04/17/13 1308 Last data filed at 04/17/13 0700  Gross per 24 hour  Intake 992.67 ml  Output   1900 ml  Net -907.33 ml    Intake/Output this shift:    Labs:  Recent Labs  04/16/13 0450 04/17/13 0508  HGB 12.2 10.5*    Recent Labs  04/16/13 0450 04/17/13 0508  WBC 11.0* 9.2  RBC 4.05 3.51*  HCT 35.9* 31.2*  PLT 197 187    Recent Labs  04/16/13 0450 04/17/13 0508  NA 135 137  K 3.8 3.6  CL 102 105  CO2 23 28  BUN 12 12  CREATININE 0.71 0.88  GLUCOSE 144* 108*  CALCIUM 8.5 8.3*   No results found for this basename: LABPT, INR,  in the last 72 hours  EXAM General - Patient is Alert, Appropriate and Oriented Extremity - Neurovascular intact Sensation intact distally Dorsiflexion/Plantar flexion intact Dressing/Incision - clean, dry, no drainage, healing Motor Function - intact, moving foot and toes well on exam.   Past Medical History  Diagnosis Date  . Irritable bowel syndrome   . Diverticulitis   . GERD (gastroesophageal reflux disease)   . Ovarian cyst   . Bunion   . Osteoporosis   . Anxiety disorder   . Depression   . Hiatal hernia   . Glaucoma   . Bacterial overgrowth syndrome   . Breast cancer, stage 1     '97/recurrent '05 right breast(s/p mastectomy)  . History of TIAs     - 13 yrs ago"brief memory loss"     Assessment/Plan: 2 Days Post-Op Procedure(s) (LRB): RIGHT TOTAL KNEE ARTHROPLASTY (Right) Principal Problem:   OA (osteoarthritis) of knee  Estimated body mass index is 20.38 kg/(m^2) as calculated from the following:   Height as of this encounter: 5' 8.5" (1.74 m).   Weight as of this encounter: 61.7 kg (136 lb 0.4 oz). Up with therapy Plan for discharge tomorrow Discharge to SNF - [facility]  DVT Prophylaxis - Xarelto Weight-Bearing as tolerated to right leg  Dawn Diaz 04/17/2013, 1:08 PM

## 2013-04-17 NOTE — Progress Notes (Signed)
Physical Therapy Treatment Patient Details Name: Dawn Diaz MRN: [id number] DOB: November 23, 1926 Today's Date: 04/17/2013 Time: 4098-1191 PT Time Calculation (min): 23 min  PT Assessment / Plan / Recommendation  History of Present Illness RTKA   PT Comments   Pt improving in mobility and rom . Pt plans snf tomorrow.  Follow Up Recommendations  SNF     Does the patient have the potential to tolerate intense rehabilitation     Barriers to Discharge        Equipment Recommendations       Recommendations for Other Services    Frequency 7X/week   Progress towards PT Goals Progress towards PT goals: Progressing toward goals  Plan Current plan remains appropriate    Precautions / Restrictions Precautions Precautions: Fall Required Braces or Orthoses: Knee Immobilizer - Right   Pertinent Vitals/Pain 6 , just had meds.   Mobility  Bed Mobility Supine to Sit: 4: Min assist Details for Bed Mobility Assistance: assist of R leg onto bed. Transfers Sit to Stand: 4: Min assist;From elevated surface;From bed Stand to Sit: 4: Min assist;With armrests;To chair/3-in-1;With upper extremity assist Details for Transfer Assistance: multimodal cueas for safe use of RW and technique, UE use, Ambulation/Gait Ambulation/Gait Assistance: 4: Min assist Ambulation Distance (Feet): 50 Feet    Exercises Total Joint Exercises Ankle Circles/Pumps: AROM;Both;10 reps Quad Sets: AROM;Right Heel Slides: AAROM;Right;10 reps Hip ABduction/ADduction: AAROM;Right;10 reps Straight Leg Raises: AAROM;Right;10 reps Goniometric ROM: 10 40 R knee   PT Diagnosis:    PT Problem List:   PT Treatment Interventions:     PT Goals (current goals can now be found in the care plan section)    Visit Information  Last PT Received On: 04/17/13 Assistance Needed: +2 History of Present Illness: RTKA    Subjective Data      Cognition  Cognition Arousal/Alertness: Awake/alert    Balance     End of Session  PT - End of Session Equipment Utilized During Treatment: Right knee immobilizer Activity Tolerance: Patient tolerated treatment well Patient left: with call bell/phone within reach;with bed alarm set;in chair Nurse Communication: Mobility status   GP     [name] 04/17/2013, 2:37 PM

## 2013-04-18 DIAGNOSIS — M069 Rheumatoid arthritis, unspecified: Secondary | ICD-10-CM | POA: Diagnosis not present

## 2013-04-18 DIAGNOSIS — H409 Unspecified glaucoma: Secondary | ICD-10-CM | POA: Diagnosis not present

## 2013-04-18 DIAGNOSIS — I10 Essential (primary) hypertension: Secondary | ICD-10-CM | POA: Diagnosis not present

## 2013-04-18 DIAGNOSIS — M1990 Unspecified osteoarthritis, unspecified site: Secondary | ICD-10-CM | POA: Diagnosis not present

## 2013-04-18 DIAGNOSIS — M1710 Unilateral primary osteoarthritis, unspecified knee: Secondary | ICD-10-CM | POA: Diagnosis not present

## 2013-04-18 DIAGNOSIS — R269 Unspecified abnormalities of gait and mobility: Secondary | ICD-10-CM | POA: Diagnosis not present

## 2013-04-18 DIAGNOSIS — K5732 Diverticulitis of large intestine without perforation or abscess without bleeding: Secondary | ICD-10-CM | POA: Diagnosis not present

## 2013-04-18 DIAGNOSIS — Z853 Personal history of malignant neoplasm of breast: Secondary | ICD-10-CM | POA: Diagnosis not present

## 2013-04-18 DIAGNOSIS — S8990XA Unspecified injury of unspecified lower leg, initial encounter: Secondary | ICD-10-CM | POA: Diagnosis not present

## 2013-04-18 DIAGNOSIS — M6281 Muscle weakness (generalized): Secondary | ICD-10-CM | POA: Diagnosis not present

## 2013-04-18 DIAGNOSIS — Z471 Aftercare following joint replacement surgery: Secondary | ICD-10-CM | POA: Diagnosis not present

## 2013-04-18 DIAGNOSIS — C50919 Malignant neoplasm of unspecified site of unspecified female breast: Secondary | ICD-10-CM | POA: Diagnosis not present

## 2013-04-18 DIAGNOSIS — Z96659 Presence of unspecified artificial knee joint: Secondary | ICD-10-CM | POA: Diagnosis not present

## 2013-04-18 DIAGNOSIS — M25569 Pain in unspecified knee: Secondary | ICD-10-CM | POA: Diagnosis not present

## 2013-04-18 DIAGNOSIS — K5900 Constipation, unspecified: Secondary | ICD-10-CM | POA: Diagnosis not present

## 2013-04-18 DIAGNOSIS — K589 Irritable bowel syndrome without diarrhea: Secondary | ICD-10-CM | POA: Diagnosis not present

## 2013-04-18 LAB — CBC
HCT: 32 % — ABNORMAL LOW (ref 36.0–46.0)
MCH: 29.5 pg (ref 26.0–34.0)
MCV: 89.1 fL (ref 78.0–100.0)
RDW: 12.6 % (ref 11.5–15.5)
WBC: 7.5 10*3/uL (ref 4.0–10.5)

## 2013-04-18 MED ORDER — BISACODYL 10 MG RE SUPP: 10.0000 mg | Freq: Every day | RECTAL | Status: DC | PRN

## 2013-04-18 MED ORDER — POLYETHYLENE GLYCOL 3350 17 G PO PACK: 17.0000 g | PACK | Freq: Every day | ORAL | Status: DC | PRN

## 2013-04-18 MED ORDER — OXYCODONE HCL 5 MG PO TABS: 5.0000 mg | ORAL_TABLET | ORAL | Status: DC | PRN

## 2013-04-18 MED ORDER — METOCLOPRAMIDE HCL 5 MG PO TABS: 5.0000 mg | ORAL_TABLET | Freq: Three times a day (TID) | ORAL | Status: DC | PRN

## 2013-04-18 MED ORDER — RIVAROXABAN 10 MG PO TABS: 10.0000 mg | ORAL_TABLET | Freq: Every day | ORAL | Status: DC

## 2013-04-18 MED ORDER — DSS 100 MG PO CAPS: 100.0000 mg | ORAL_CAPSULE | Freq: Two times a day (BID) | ORAL | Status: DC

## 2013-04-18 MED ORDER — METHOCARBAMOL 500 MG PO TABS: 500.0000 mg | ORAL_TABLET | Freq: Four times a day (QID) | ORAL | Status: DC | PRN

## 2013-04-18 MED ORDER — TRAMADOL HCL 50 MG PO TABS: 50.0000 mg | ORAL_TABLET | Freq: Four times a day (QID) | ORAL | Status: DC | PRN

## 2013-04-18 MED ORDER — ACETAMINOPHEN 325 MG PO TABS: 650.0000 mg | ORAL_TABLET | Freq: Four times a day (QID) | ORAL | Status: DC | PRN

## 2013-04-18 MED ORDER — ONDANSETRON HCL 4 MG PO TABS: 4.0000 mg | ORAL_TABLET | Freq: Four times a day (QID) | ORAL | Status: DC | PRN

## 2013-04-18 NOTE — Progress Notes (Signed)
Clinical Social Work Department CLINICAL SOCIAL WORK PLACEMENT NOTE 04/18/2013  Patient:  Dawn Diaz, Dawn Diaz  Account Number:  [id number] Admit date:  04/15/2013  Clinical Social Worker:  Cori Razor, LCSW  Date/time:  04/16/2013 02:29 PM  Clinical Social Work is seeking post-discharge placement for this patient at the following level of care:   SKILLED NURSING   (*CSW will update this form in Epic as items are completed)   04/16/2013  Patient/family provided with [facility] Department of Clinical Social Work's list of facilities offering this level of care within the geographic area requested by the patient (or if unable, by the patient's family).  04/16/2013  Patient/family informed of their freedom to choose among providers that offer the needed level of care, that participate in Medicare, Medicaid or managed care program needed by the patient, have an available bed and are willing to accept the patient.  04/16/2013  Patient/family informed of [facility]' ownership interest in [facility], as well as of the fact that they are under no obligation to receive care at this facility.  PASARR submitted to EDS on 04/16/2013 PASARR number received from EDS on 04/16/2013  FL2 transmitted to all facilities in geographic area requested by pt/family on  04/16/2013 FL2 transmitted to all facilities within larger geographic area on   Patient informed that his/her managed care company has contracts with or will negotiate with  certain facilities, including the following:     Patient/family informed of bed offers received:  04/16/2013 Patient chooses bed at [facility] Physician recommends and patient chooses bed at    Patient to be transferred to [facility] on  04/18/2013 Patient to be transferred to facility by [facility]  The following physician request were entered in Epic:   Additional Comments:  Cori Razor LCSW [phone]

## 2013-04-18 NOTE — Progress Notes (Signed)
   Subjective: 3 Days Post-Op Procedure(s) (LRB): RIGHT TOTAL KNEE ARTHROPLASTY (Right) Patient reports pain as mild.   Patient seen in rounds by Dr. Lequita Halt.  Low grade temp last night but not unusual postop.    Patient is well, and has had no acute complaints or problems Patient is ready to go to [facility] today  Objective: Vital signs in last 24 hours: Temp:  [99.7 F (37.6 C)-100.5 F (38.1 C)] 100.5 F (38.1 C) (10/30 0512) Pulse Rate:  [78-81] 81 (10/30 0512) Resp:  [15-18] 17 (10/30 0512) BP: (128-147)/(67-75) 128/67 mmHg (10/30 0512) SpO2:  [91 %-98 %] 91 % (10/30 0512)  Intake/Output from previous day:  Intake/Output Summary (Last 24 hours) at 04/18/13 0929 Last data filed at 04/18/13 0900  Gross per 24 hour  Intake    600 ml  Output   2550 ml  Net  -1950 ml    Intake/Output this shift: Total I/O In: 240 [P.O.:240] Out: -   Labs:  Recent Labs  04/16/13 0450 04/17/13 0508 04/18/13 0458  HGB 12.2 10.5* 10.6*    Recent Labs  04/17/13 0508 04/18/13 0458  WBC 9.2 7.5  RBC 3.51* 3.59*  HCT 31.2* 32.0*  PLT 187 175    Recent Labs  04/16/13 0450 04/17/13 0508  NA 135 137  K 3.8 3.6  CL 102 105  CO2 23 28  BUN 12 12  CREATININE 0.71 0.88  GLUCOSE 144* 108*  CALCIUM 8.5 8.3*   No results found for this basename: LABPT, INR,  in the last 72 hours  EXAM: General - Patient is Alert, Appropriate and Oriented Extremity - Neurovascular intact Sensation intact distally Dorsiflexion/Plantar flexion intact No cellulitis present Incision - clean, dry, no drainage, healing Motor Function - intact, moving foot and toes well on exam.   Assessment/Plan: 3 Days Post-Op Procedure(s) (LRB): RIGHT TOTAL KNEE ARTHROPLASTY (Right) Procedure(s) (LRB): RIGHT TOTAL KNEE ARTHROPLASTY (Right) Past Medical History  Diagnosis Date  . Irritable bowel syndrome   . Diverticulitis   . GERD (gastroesophageal reflux disease)   . Ovarian cyst   . Bunion   .  Osteoporosis   . Anxiety disorder   . Depression   . Hiatal hernia   . Glaucoma   . Bacterial overgrowth syndrome   . Breast cancer, stage 1     '97/recurrent '05 right breast(s/p mastectomy)  . History of TIAs     - 13 yrs ago"brief memory loss"   Principal Problem:   OA (osteoarthritis) of knee  Estimated body mass index is 20.38 kg/(m^2) as calculated from the following:   Height as of this encounter: 5' 8.5" (1.74 m).   Weight as of this encounter: 61.7 kg (136 lb 0.4 oz). Up with therapy Discharge to SNF [facility] Diet - Regular diet Follow up - in 2 weeks Activity - WBAT Disposition - Skilled nursing facility Condition Upon Discharge - Good D/C Meds - See DC Summary DVT Prophylaxis - Xarelto  Dawn Diaz 04/18/2013, 9:29 AM

## 2013-04-18 NOTE — Discharge Summary (Signed)
Physician Discharge Summary   Patient ID: Dawn Diaz MRN: [id number] DOB/AGE: 77-Nov-1928 77 y.o.  Admit date: 04/15/2013 Discharge date: 04/18/2013  Primary Diagnosis:  Osteoarthritis Right knee(s)  Admission Diagnoses:  Past Medical History  Diagnosis Date  . Irritable bowel syndrome   . Diverticulitis   . GERD (gastroesophageal reflux disease)   . Ovarian cyst   . Bunion   . Osteoporosis   . Anxiety disorder   . Depression   . Hiatal hernia   . Glaucoma   . Bacterial overgrowth syndrome   . Breast cancer, stage 1     '97/recurrent '05 right breast(s/p mastectomy)  . History of TIAs     - 13 yrs ago"brief memory loss"   Discharge Diagnoses:   Principal Problem:   OA (osteoarthritis) of knee  Estimated body mass index is 20.38 kg/(m^2) as calculated from the following:   Height as of this encounter: 5' 8.5" (1.74 m).   Weight as of this encounter: 61.7 kg (136 lb 0.4 oz).  Procedure:  Procedure(s) (LRB): RIGHT TOTAL KNEE ARTHROPLASTY (Right)   Consults: None  HPI: Dawn Diaz is a 79 y.o. year old female with end stage OA of her right knee with progressively worsening pain and dysfunction. She has constant pain, with activity and at rest and significant functional deficits with difficulties even with ADLs. She has had extensive non-op management including analgesics, injections of cortisone and viscosupplements, and home exercise program, but remains in significant pain with significant dysfunction.Radiographs show bone on bone arthritis medial and patellofemoral. She presents now for right Total Knee Arthroplasty.   Laboratory Data: Admission on 04/15/2013  Component Date Value Range Status  . WBC 04/16/2013 11.0* 4.0 - 10.5 K/uL Final  . RBC 04/16/2013 4.05  3.87 - 5.11 MIL/uL Final  . Hemoglobin 04/16/2013 12.2  12.0 - 15.0 g/dL Final  . HCT 78/29/5621 35.9* 36.0 - 46.0 % Final  . MCV 04/16/2013 88.6  78.0 - 100.0 fL Final  . MCH 04/16/2013 30.1  26.0  - 34.0 pg Final  . MCHC 04/16/2013 34.0  30.0 - 36.0 g/dL Final  . RDW 30/86/5784 12.3  11.5 - 15.5 % Final  . Platelets 04/16/2013 197  150 - 400 K/uL Final  . Sodium 04/16/2013 135  135 - 145 mEq/L Final  . Potassium 04/16/2013 3.8  3.5 - 5.1 mEq/L Final  . Chloride 04/16/2013 102  96 - 112 mEq/L Final  . CO2 04/16/2013 23  19 - 32 mEq/L Final  . Glucose, Bld 04/16/2013 144* 70 - 99 mg/dL Final  . BUN 69/62/9528 12  6 - 23 mg/dL Final  . Creatinine, Ser 04/16/2013 0.71  0.50 - 1.10 mg/dL Final  . Calcium 41/32/4401 8.5  8.4 - 10.5 mg/dL Final  . GFR calc non Af Amer 04/16/2013 78* >90 mL/min Final  . GFR calc Af Amer 04/16/2013 90* >90 mL/min Final   Comment: (NOTE)                          The eGFR has been calculated using the CKD EPI equation.                          This calculation has not been validated in all clinical situations.                          eGFR's persistently <90 mL/min  signify possible Chronic Kidney                          Disease.  . WBC 04/17/2013 9.2  4.0 - 10.5 K/uL Final  . RBC 04/17/2013 3.51* 3.87 - 5.11 MIL/uL Final  . Hemoglobin 04/17/2013 10.5* 12.0 - 15.0 g/dL Final  . HCT 16/03/9603 31.2* 36.0 - 46.0 % Final  . MCV 04/17/2013 88.9  78.0 - 100.0 fL Final  . MCH 04/17/2013 29.9  26.0 - 34.0 pg Final  . MCHC 04/17/2013 33.7  30.0 - 36.0 g/dL Final  . RDW 54/02/8118 12.6  11.5 - 15.5 % Final  . Platelets 04/17/2013 187  150 - 400 K/uL Final  . Sodium 04/17/2013 137  135 - 145 mEq/L Final  . Potassium 04/17/2013 3.6  3.5 - 5.1 mEq/L Final  . Chloride 04/17/2013 105  96 - 112 mEq/L Final  . CO2 04/17/2013 28  19 - 32 mEq/L Final  . Glucose, Bld 04/17/2013 108* 70 - 99 mg/dL Final  . BUN 14/78/2956 12  6 - 23 mg/dL Final  . Creatinine, Ser 04/17/2013 0.88  0.50 - 1.10 mg/dL Final  . Calcium 21/30/8657 8.3* 8.4 - 10.5 mg/dL Final  . GFR calc non Af Amer 04/17/2013 59* >90 mL/min Final  . GFR calc Af Amer 04/17/2013 69* >90 mL/min Final   Comment:  (NOTE)                          The eGFR has been calculated using the CKD EPI equation.                          This calculation has not been validated in all clinical situations.                          eGFR's persistently <90 mL/min signify possible Chronic Kidney                          Disease.  . WBC 04/18/2013 7.5  4.0 - 10.5 K/uL Final  . RBC 04/18/2013 3.59* 3.87 - 5.11 MIL/uL Final  . Hemoglobin 04/18/2013 10.6* 12.0 - 15.0 g/dL Final  . HCT 84/69/6295 32.0* 36.0 - 46.0 % Final  . MCV 04/18/2013 89.1  78.0 - 100.0 fL Final  . MCH 04/18/2013 29.5  26.0 - 34.0 pg Final  . MCHC 04/18/2013 33.1  30.0 - 36.0 g/dL Final  . RDW 28/41/3244 12.6  11.5 - 15.5 % Final  . Platelets 04/18/2013 175  150 - 400 K/uL Final  Hospital Outpatient Visit on 04/10/2013  Component Date Value Range Status  . aPTT 04/10/2013 28  24 - 37 seconds Final  . WBC 04/10/2013 5.4  4.0 - 10.5 K/uL Final  . RBC 04/10/2013 5.10  3.87 - 5.11 MIL/uL Final  . Hemoglobin 04/10/2013 15.4* 12.0 - 15.0 g/dL Final  . HCT 06/22/7251 46.5* 36.0 - 46.0 % Final  . MCV 04/10/2013 91.2  78.0 - 100.0 fL Final  . MCH 04/10/2013 30.2  26.0 - 34.0 pg Final  . MCHC 04/10/2013 33.1  30.0 - 36.0 g/dL Final  . RDW 66/44/0347 12.6  11.5 - 15.5 % Final  . Platelets 04/10/2013 220  150 - 400 K/uL Final  . Sodium 04/10/2013 136  135 - 145 mEq/L Final  .  Potassium 04/10/2013 4.4  3.5 - 5.1 mEq/L Final  . Chloride 04/10/2013 101  96 - 112 mEq/L Final  . CO2 04/10/2013 30  19 - 32 mEq/L Final  . Glucose, Bld 04/10/2013 111* 70 - 99 mg/dL Final  . BUN 16/03/9603 14  6 - 23 mg/dL Final  . Creatinine, Ser 04/10/2013 0.79  0.50 - 1.10 mg/dL Final  . Calcium 54/02/8118 9.1  8.4 - 10.5 mg/dL Final  . Total Protein 04/10/2013 6.8  6.0 - 8.3 g/dL Final  . Albumin 14/78/2956 3.4* 3.5 - 5.2 g/dL Final  . AST 21/30/8657 28  0 - 37 U/L Final  . ALT 04/10/2013 14  0 - 35 U/L Final  . Alkaline Phosphatase 04/10/2013 66  39 - 117 U/L Final  .  Total Bilirubin 04/10/2013 0.4  0.3 - 1.2 mg/dL Final  . GFR calc non Af Amer 04/10/2013 75* >90 mL/min Final  . GFR calc Af Amer 04/10/2013 87* >90 mL/min Final   Comment: (NOTE)                          The eGFR has been calculated using the CKD EPI equation.                          This calculation has not been validated in all clinical situations.                          eGFR's persistently <90 mL/min signify possible Chronic Kidney                          Disease.  Marland Kitchen Prothrombin Time 04/10/2013 13.0  11.6 - 15.2 seconds Final  . INR 04/10/2013 1.00  0.00 - 1.49 Final  . Color, Urine 04/10/2013 YELLOW  YELLOW Final  . APPearance 04/10/2013 CLEAR  CLEAR Final  . Specific Gravity, Urine 04/10/2013 1.015  1.005 - 1.030 Final  . pH 04/10/2013 6.0  5.0 - 8.0 Final  . Glucose, UA 04/10/2013 NEGATIVE  NEGATIVE mg/dL Final  . Hgb urine dipstick 04/10/2013 NEGATIVE  NEGATIVE Final  . Bilirubin Urine 04/10/2013 NEGATIVE  NEGATIVE Final  . Ketones, ur 04/10/2013 NEGATIVE  NEGATIVE mg/dL Final  . Protein, ur 84/69/6295 NEGATIVE  NEGATIVE mg/dL Final  . Urobilinogen, UA 04/10/2013 0.2  0.0 - 1.0 mg/dL Final  . Nitrite 28/41/3244 NEGATIVE  NEGATIVE Final  . Leukocytes, UA 04/10/2013 NEGATIVE  NEGATIVE Final   MICROSCOPIC NOT DONE ON URINES WITH NEGATIVE PROTEIN, BLOOD, LEUKOCYTES, NITRITE, OR GLUCOSE <1000 mg/dL.  . ABO/RH(D) 04/10/2013 A POS   Final  . Antibody Screen 04/10/2013 NEG   Final  . Sample Expiration 04/10/2013 04/18/2013   Final  . ABO/RH(D) 04/10/2013 A POS   Final  . MRSA, PCR 04/10/2013 NEGATIVE  NEGATIVE Final  . Staphylococcus aureus 04/10/2013 NEGATIVE  NEGATIVE Final   Comment:                                 The Xpert SA Assay (FDA                          approved for NASAL specimens  in patients over 13 years of age),                          is one component of                          a comprehensive surveillance                           program.  Test performance has                          been validated by [employer] for patients greater                          than or equal to 60 year old.                          It is not intended                          to diagnose infection nor to                          guide or monitor treatment.     X-Rays:No results found.  EKG: Orders placed during the hospital encounter of 04/10/13  . EKG 12-LEAD  . EKG 12-LEAD     Hospital Course: Dawn Diaz is a 79 y.o. who was admitted to [facility]. They were brought to the operating room on 04/15/2013 and underwent Procedure(s): RIGHT TOTAL KNEE ARTHROPLASTY.  Patient tolerated the procedure well and was later transferred to the recovery room and then to the orthopaedic floor for postoperative care.  They were given PO and IV analgesics for pain control following their surgery.  They were given 24 hours of postoperative antibiotics of  Anti-infectives   Start     Dose/Rate Route Frequency Ordered Stop   04/16/13 0200  vancomycin (VANCOCIN) IVPB 1000 mg/200 mL premix     1,000 mg 200 mL/hr over 60 Minutes Intravenous  Once 04/15/13 1623 04/16/13 0224   04/15/13 1046  vancomycin (VANCOCIN) IVPB 1000 mg/200 mL premix     1,000 mg 200 mL/hr over 60 Minutes Intravenous On call to O.R. 04/15/13 1046 04/15/13 1410     and started on DVT prophylaxis in the form of Xarelto.   PT and OT were ordered for total joint protocol.  Discharge planning consulted to help with postop disposition and equipment needs.  Patient had a tough night on the evening of surgery.  They started to get up OOB with therapy on day one. Hemovac drain was pulled without difficulty.  Continued to work with therapy into day two.  Dressing was changed on day two and the incision was healing well.  By day three, the patient had progressed with therapy and meeting their goals.  Incision was healing well.  Patient was seen in rounds  and was ready to go to [facility] on day three.  It was noted that she had a low grade temp on the evening prior to transfer which  is not at all uncommon.  Her WBC was normal and no complaints.  Continue to encourage the incentive spirometer while at rehab.   Discharge Medications: Prior to Admission medications   Medication Sig Start Date End Date Taking? Authorizing Provider  latanoprost (XALATAN) 0.005 % ophthalmic solution Place 1 drop into both eyes daily.  06/18/11  Yes Historical Provider, MD  tamoxifen (NOLVADEX) 10 MG tablet Take 2 tablets (20 mg total) by mouth daily. 08/01/12  Yes Ladene Artist, MD  timolol (TIMOPTIC) 0.5 % ophthalmic solution Place 1 drop into the right eye 2 (two) times daily.  02/21/12  Yes Historical Provider, MD  zolpidem (AMBIEN) 5 MG tablet Take 2.5 mg by mouth at bedtime as needed for sleep.  01/28/12  Yes Historical Provider, MD  acetaminophen (TYLENOL) 325 MG tablet Take 2 tablets (650 mg total) by mouth every 6 (six) hours as needed. 04/18/13   Alexzandrew Perkins, PA-C  bisacodyl (DULCOLAX) 10 MG suppository Place 1 suppository (10 mg total) rectally daily as needed. 04/18/13   Alexzandrew Perkins, PA-C  docusate sodium 100 MG CAPS Take 100 mg by mouth 2 (two) times daily. 04/18/13   Alexzandrew Perkins, PA-C  methocarbamol (ROBAXIN) 500 MG tablet Take 1 tablet (500 mg total) by mouth every 6 (six) hours as needed. 04/18/13   Alexzandrew Perkins, PA-C  metoCLOPramide (REGLAN) 5 MG tablet Take 1-2 tablets (5-10 mg total) by mouth every 8 (eight) hours as needed (if ondansetron (ZOFRAN) ineffective.). 04/18/13   Alexzandrew Perkins, PA-C  ondansetron (ZOFRAN) 4 MG tablet Take 1 tablet (4 mg total) by mouth every 6 (six) hours as needed for nausea. 04/18/13   Alexzandrew Perkins, PA-C  oxyCODONE (OXY IR/ROXICODONE) 5 MG immediate release tablet Take 1-2 tablets (5-10 mg total) by mouth every 3 (three) hours as needed. 04/18/13   Alexzandrew Perkins, PA-C    polyethylene glycol (MIRALAX / GLYCOLAX) packet Take 17 g by mouth daily as needed. 04/18/13   Alexzandrew Julien Girt, PA-C  rivaroxaban (XARELTO) 10 MG TABS tablet Take 1 tablet (10 mg total) by mouth daily with breakfast. Take Xarelto for two and a half more weeks, then discontinue Xarelto. Once the patient has completed the Xarelto, they may resume the 81 mg Aspirin daily and the Plaquenil 200 mg twice a day. 04/18/13   Alexzandrew Perkins, PA-C  traMADol (ULTRAM) 50 MG tablet Take 1-2 tablets (50-100 mg total) by mouth every 6 (six) hours as needed (mild pain). 04/18/13   Alexzandrew Julien Girt, PA-C   Discharge to SNF [facility]  Diet - Regular diet  Follow up - in 2 weeks  Activity - WBAT  Disposition - Skilled nursing facility - [facility] Condition Upon Discharge - Good  D/C Meds - See DC Summary  DVT Prophylaxis - Xarelto      Discharge Orders   Future Appointments Provider Department Dept Phone   09/30/2013 3:15 PM Ladene Artist, MD [facility] [phone]   Future Orders Complete By Expires   Call MD / Call 911  As directed    Comments:     If you experience chest pain or shortness of breath, CALL 911 and be transported to the hospital emergency room.  If you develope a fever above 101 F, pus (white drainage) or increased drainage or redness at the wound, or calf pain, call your surgeon's office.   Change dressing  As directed    Comments:     Change dressing daily with sterile 4 x  4 inch gauze dressing and apply TED hose. Do not submerge the incision under water.   Constipation Prevention  As directed    Comments:     Drink plenty of fluids.  Prune juice may be helpful.  You may use a stool softener, such as Colace (over the counter) 100 mg twice a day.  Use MiraLax (over the counter) for constipation as needed.   Diet - low sodium heart healthy  As directed    Discharge instructions  As directed    Comments:     Pick up stool softner and  laxative for home. Do not submerge incision under water. May shower. Continue to use ice for pain and swelling from surgery. Hip precautions.  Total Hip Protocol.  Take Xarelto for two and a half more weeks, then discontinue Xarelto.   Do not put a pillow under the knee. Place it under the heel.  As directed    Do not sit on low chairs, stoools or toilet seats, as it may be difficult to get up from low surfaces  As directed    Driving restrictions  As directed    Comments:     No driving until released by the physician.   Increase activity slowly as tolerated  As directed    Lifting restrictions  As directed    Comments:     No lifting until released by the physician.   Patient may shower  As directed    Comments:     You may shower without a dressing once there is no drainage.  Do not wash over the wound.  If drainage remains, do not shower until drainage stops.   TED hose  As directed    Comments:     Use stockings (TED hose) for 3 weeks on both leg(s).  You may remove them at night for sleeping.   Weight bearing as tolerated  As directed    Questions:     Laterality:     Extremity:         Medication List    STOP taking these medications       aspirin 81 MG tablet     hydroxychloroquine 200 MG tablet  Commonly known as:  PLAQUENIL     PROBIOTIC FORMULA Caps     Vitamin D (Ergocalciferol) 50000 UNITS Caps capsule  Commonly known as:  DRISDOL      TAKE these medications       acetaminophen 325 MG tablet  Commonly known as:  TYLENOL  Take 2 tablets (650 mg total) by mouth every 6 (six) hours as needed.     bisacodyl 10 MG suppository  Commonly known as:  DULCOLAX  Place 1 suppository (10 mg total) rectally daily as needed.     DSS 100 MG Caps  Take 100 mg by mouth 2 (two) times daily.     latanoprost 0.005 % ophthalmic solution  Commonly known as:  XALATAN  Place 1 drop into both eyes daily.     methocarbamol 500 MG tablet  Commonly known as:  ROBAXIN    Take 1 tablet (500 mg total) by mouth every 6 (six) hours as needed.     metoCLOPramide 5 MG tablet  Commonly known as:  REGLAN  Take 1-2 tablets (5-10 mg total) by mouth every 8 (eight) hours as needed (if ondansetron (ZOFRAN) ineffective.).     ondansetron 4 MG tablet  Commonly known as:  ZOFRAN  Take 1 tablet (4 mg total) by mouth  every 6 (six) hours as needed for nausea.     oxyCODONE 5 MG immediate release tablet  Commonly known as:  Oxy IR/ROXICODONE  Take 1-2 tablets (5-10 mg total) by mouth every 3 (three) hours as needed.     polyethylene glycol packet  Commonly known as:  MIRALAX / GLYCOLAX  Take 17 g by mouth daily as needed.     rivaroxaban 10 MG Tabs tablet  Commonly known as:  XARELTO  - Take 1 tablet (10 mg total) by mouth daily with breakfast. Take Xarelto for two and a half more weeks, then discontinue Xarelto.  - Once the patient has completed the Xarelto, they may resume the 81 mg Aspirin daily and the Plaquenil 200 mg twice a day.     tamoxifen 10 MG tablet  Commonly known as:  NOLVADEX  Take 2 tablets (20 mg total) by mouth daily.     timolol 0.5 % ophthalmic solution  Commonly known as:  TIMOPTIC  Place 1 drop into the right eye 2 (two) times daily.     traMADol 50 MG tablet  Commonly known as:  ULTRAM  Take 1-2 tablets (50-100 mg total) by mouth every 6 (six) hours as needed (mild pain).     zolpidem 5 MG tablet  Commonly known as:  AMBIEN  Take 2.5 mg by mouth at bedtime as needed for sleep.       Follow-up Information   Follow up with Loanne Drilling, MD. Schedule an appointment as soon as possible for a visit in 2 weeks.   Specialty:  Orthopedic Surgery   Contact information:   [address] [place] [address] [phone]       Signed: Patrica Duel 04/18/2013, 9:39 AM

## 2013-04-18 NOTE — Care Management Note (Signed)
    Page 1 of 1   04/18/2013     5:04:53 PM   CARE MANAGEMENT NOTE 04/18/2013  Patient:  Dawn Diaz, Dawn Diaz   Account Number:  [id number]  Date Initiated:  04/17/2013  Documentation initiated by:  Colleen Can  Subjective/Objective Assessment:   dx total rt knee replacemnt     Action/Plan:   SNF rehab   Anticipated DC Date:  04/18/2013   Anticipated DC Plan:  SKILLED NURSING FACILITY  In-house referral  Clinical Social Worker      DC Planning Services  CM consult      Choice offered to / List presented to:             Status of service:  Completed, signed off Medicare Important Message given?   (If response is "NO", the following Medicare IM given date fields will be blank) Date Medicare IM given:   Date Additional Medicare IM given:    Discharge Disposition:  SKILLED NURSING FACILITY  Per UR Regulation:    If discussed at Long Length of Stay Meetings, dates discussed:    Comments:

## 2013-04-18 NOTE — Progress Notes (Signed)
MD on unit and informed of pt's temp.

## 2013-04-19 ENCOUNTER — Other Ambulatory Visit: Payer: Self-pay

## 2013-04-19 MED ORDER — OXYCODONE HCL 5 MG PO TABS: ORAL_TABLET | ORAL | Status: DC

## 2013-04-19 NOTE — Telephone Encounter (Signed)
Previous rx had every 8 hours as needed when it should state 1 by mouth every 8 hours (scheduled)

## 2013-04-22 ENCOUNTER — Non-Acute Institutional Stay (SKILLED_NURSING_FACILITY): Payer: Medicare Other | Admitting: Adult Health

## 2013-04-22 DIAGNOSIS — Z853 Personal history of malignant neoplasm of breast: Secondary | ICD-10-CM

## 2013-04-22 DIAGNOSIS — M069 Rheumatoid arthritis, unspecified: Secondary | ICD-10-CM | POA: Diagnosis not present

## 2013-04-22 DIAGNOSIS — K5900 Constipation, unspecified: Secondary | ICD-10-CM | POA: Diagnosis not present

## 2013-04-22 DIAGNOSIS — H409 Unspecified glaucoma: Secondary | ICD-10-CM

## 2013-04-22 DIAGNOSIS — M1710 Unilateral primary osteoarthritis, unspecified knee: Secondary | ICD-10-CM

## 2013-04-22 DIAGNOSIS — IMO0002 Reserved for concepts with insufficient information to code with codable children: Secondary | ICD-10-CM

## 2013-04-22 DIAGNOSIS — G4700 Insomnia, unspecified: Secondary | ICD-10-CM

## 2013-04-22 DIAGNOSIS — M179 Osteoarthritis of knee, unspecified: Secondary | ICD-10-CM

## 2013-04-23 ENCOUNTER — Other Ambulatory Visit: Payer: Self-pay

## 2013-04-23 MED ORDER — OXYCODONE HCL 5 MG PO TABS: ORAL_TABLET | ORAL | Status: DC

## 2013-04-24 ENCOUNTER — Non-Acute Institutional Stay (SKILLED_NURSING_FACILITY): Payer: Medicare Other | Admitting: Internal Medicine

## 2013-04-24 DIAGNOSIS — C50919 Malignant neoplasm of unspecified site of unspecified female breast: Secondary | ICD-10-CM | POA: Diagnosis not present

## 2013-04-24 DIAGNOSIS — K5900 Constipation, unspecified: Secondary | ICD-10-CM

## 2013-04-24 DIAGNOSIS — H409 Unspecified glaucoma: Secondary | ICD-10-CM | POA: Insufficient documentation

## 2013-04-24 DIAGNOSIS — M179 Osteoarthritis of knee, unspecified: Secondary | ICD-10-CM

## 2013-04-24 DIAGNOSIS — M1710 Unilateral primary osteoarthritis, unspecified knee: Secondary | ICD-10-CM

## 2013-04-24 NOTE — Progress Notes (Signed)
Patient ID: Dawn Diaz, female   DOB: 1927-06-01, 79 y.o.   MRN: [id number]       PROGRESS NOTE  DATE: 04/22/2013  FACILITY: Nursing Home Location: [facility]  LEVEL OF CARE: SNF (31)  Acute Visit  CHIEF COMPLAINT:  Follow-up hospitalization  HISTORY OF PRESENT ILLNESS: This is an 79 year old female who has been admitted to [facility] on 04/18/13 from [facility] with Osteoarthritis S/P right total knee arthroplasty. She has been admitted for a short-term rehabilitation.  REASSESSMENT OF ONGOING PROBLEM(S):  CONSTIPATION: The constipation remains stable. No complications from the medications presently being used. Patient denies ongoing constipation, abdominal pain, nausea or vomiting.   INSOMNIA: The insomnia remains stable.  No complications noted from the medications presently being used. Patient denies ongoing insomnia, pain, hallucinations, delusions.  GERD: pt's GERD is stable.  Denies ongoing heartburn, abd. pain, nausea or vomiting.   PAST MEDICAL HISTORY : Reviewed.  No changes.  CURRENT MEDICATIONS: Reviewed per [facility]  REVIEW OF SYSTEMS:  GENERAL: no change in appetite, no fatigue, no weight changes, no fever, chills or weakness RESPIRATORY: no cough, SOB, DOE, wheezing, hemoptysis CARDIAC: no chest pain, edema or palpitations GI: no abdominal pain, diarrhea, constipation, heart burn, nausea or vomiting  PHYSICAL EXAMINATION  VS:  T 98      P 60      RR 20    BP 126/64     POX 98 %     WT 135.6 (Lb)  GENERAL: no acute distress, normal body habitus EYES: conjunctivae normal, sclerae normal, normal eye lids NECK: supple, trachea midline, no neck masses, no thyroid tenderness, no thyromegaly LYMPHATICS: no LAN in the neck, no supraclavicular LAN RESPIRATORY: breathing is even & unlabored, BS CTAB CARDIAC: RRR, no murmur,no extra heart sounds, no edema GI: abdomen soft, normal BS, no masses, no tenderness, no hepatomegaly, no  splenomegaly PSYCHIATRIC: the patient is alert & oriented to person, affect & behavior appropriate  LABS/RADIOLOGY: 04/16/13 WBC 11 hemoglobin 12.2 hematocrit 35.9 sodium 135 potassium 3.8 glucose 144 BUN 12 creatinine 0.71 calcium 8.4   ASSESSMENT/PLAN:  Osteoarthritis status post right total knee arthroplasty - for rehabilitation  Constipation - no complaints  Rheumatoid arthritis - stable  Glaucoma - stable  History of breast cancer - continue tamoxifen  Insomnia - no complaints     CPT CODE: 78295

## 2013-04-25 ENCOUNTER — Other Ambulatory Visit: Payer: Self-pay

## 2013-04-27 DIAGNOSIS — Z853 Personal history of malignant neoplasm of breast: Secondary | ICD-10-CM | POA: Diagnosis not present

## 2013-04-27 DIAGNOSIS — Z7901 Long term (current) use of anticoagulants: Secondary | ICD-10-CM | POA: Diagnosis not present

## 2013-04-27 DIAGNOSIS — Z4801 Encounter for change or removal of surgical wound dressing: Secondary | ICD-10-CM | POA: Diagnosis not present

## 2013-04-27 DIAGNOSIS — Z96659 Presence of unspecified artificial knee joint: Secondary | ICD-10-CM | POA: Diagnosis not present

## 2013-04-27 DIAGNOSIS — Z471 Aftercare following joint replacement surgery: Secondary | ICD-10-CM | POA: Diagnosis not present

## 2013-04-29 DIAGNOSIS — Z471 Aftercare following joint replacement surgery: Secondary | ICD-10-CM | POA: Diagnosis not present

## 2013-04-29 DIAGNOSIS — Z96659 Presence of unspecified artificial knee joint: Secondary | ICD-10-CM | POA: Diagnosis not present

## 2013-04-29 DIAGNOSIS — Z4801 Encounter for change or removal of surgical wound dressing: Secondary | ICD-10-CM | POA: Diagnosis not present

## 2013-04-29 DIAGNOSIS — Z7901 Long term (current) use of anticoagulants: Secondary | ICD-10-CM | POA: Diagnosis not present

## 2013-04-29 DIAGNOSIS — Z853 Personal history of malignant neoplasm of breast: Secondary | ICD-10-CM | POA: Diagnosis not present

## 2013-04-30 DIAGNOSIS — Z7901 Long term (current) use of anticoagulants: Secondary | ICD-10-CM | POA: Diagnosis not present

## 2013-04-30 DIAGNOSIS — Z96659 Presence of unspecified artificial knee joint: Secondary | ICD-10-CM | POA: Diagnosis not present

## 2013-04-30 DIAGNOSIS — Z471 Aftercare following joint replacement surgery: Secondary | ICD-10-CM | POA: Diagnosis not present

## 2013-04-30 DIAGNOSIS — Z4801 Encounter for change or removal of surgical wound dressing: Secondary | ICD-10-CM | POA: Diagnosis not present

## 2013-04-30 DIAGNOSIS — Z853 Personal history of malignant neoplasm of breast: Secondary | ICD-10-CM | POA: Diagnosis not present

## 2013-05-03 DIAGNOSIS — Z471 Aftercare following joint replacement surgery: Secondary | ICD-10-CM | POA: Diagnosis not present

## 2013-05-03 DIAGNOSIS — Z96659 Presence of unspecified artificial knee joint: Secondary | ICD-10-CM | POA: Diagnosis not present

## 2013-05-03 DIAGNOSIS — Z7901 Long term (current) use of anticoagulants: Secondary | ICD-10-CM | POA: Diagnosis not present

## 2013-05-03 DIAGNOSIS — Z4801 Encounter for change or removal of surgical wound dressing: Secondary | ICD-10-CM | POA: Diagnosis not present

## 2013-05-03 DIAGNOSIS — Z853 Personal history of malignant neoplasm of breast: Secondary | ICD-10-CM | POA: Diagnosis not present

## 2013-05-06 DIAGNOSIS — Z471 Aftercare following joint replacement surgery: Secondary | ICD-10-CM | POA: Diagnosis not present

## 2013-05-06 DIAGNOSIS — Z7901 Long term (current) use of anticoagulants: Secondary | ICD-10-CM | POA: Diagnosis not present

## 2013-05-06 DIAGNOSIS — Z853 Personal history of malignant neoplasm of breast: Secondary | ICD-10-CM | POA: Diagnosis not present

## 2013-05-06 DIAGNOSIS — Z96659 Presence of unspecified artificial knee joint: Secondary | ICD-10-CM | POA: Diagnosis not present

## 2013-05-06 DIAGNOSIS — Z4801 Encounter for change or removal of surgical wound dressing: Secondary | ICD-10-CM | POA: Diagnosis not present

## 2013-05-07 DIAGNOSIS — M25569 Pain in unspecified knee: Secondary | ICD-10-CM | POA: Diagnosis not present

## 2013-05-09 DIAGNOSIS — M25569 Pain in unspecified knee: Secondary | ICD-10-CM | POA: Diagnosis not present

## 2013-05-13 DIAGNOSIS — M25569 Pain in unspecified knee: Secondary | ICD-10-CM | POA: Diagnosis not present

## 2013-05-15 DIAGNOSIS — M25569 Pain in unspecified knee: Secondary | ICD-10-CM | POA: Diagnosis not present

## 2013-05-21 DIAGNOSIS — C50919 Malignant neoplasm of unspecified site of unspecified female breast: Secondary | ICD-10-CM | POA: Diagnosis not present

## 2013-05-21 DIAGNOSIS — M129 Arthropathy, unspecified: Secondary | ICD-10-CM | POA: Diagnosis not present

## 2013-05-21 DIAGNOSIS — IMO0002 Reserved for concepts with insufficient information to code with codable children: Secondary | ICD-10-CM | POA: Diagnosis not present

## 2013-05-21 DIAGNOSIS — M069 Rheumatoid arthritis, unspecified: Secondary | ICD-10-CM | POA: Diagnosis not present

## 2013-05-21 DIAGNOSIS — G4700 Insomnia, unspecified: Secondary | ICD-10-CM | POA: Diagnosis not present

## 2013-05-21 DIAGNOSIS — M25569 Pain in unspecified knee: Secondary | ICD-10-CM | POA: Diagnosis not present

## 2013-05-21 DIAGNOSIS — F341 Dysthymic disorder: Secondary | ICD-10-CM | POA: Diagnosis not present

## 2013-05-21 DIAGNOSIS — E559 Vitamin D deficiency, unspecified: Secondary | ICD-10-CM | POA: Diagnosis not present

## 2013-05-22 ENCOUNTER — Other Ambulatory Visit: Payer: Self-pay

## 2013-05-22 DIAGNOSIS — Z1231 Encounter for screening mammogram for malignant neoplasm of breast: Secondary | ICD-10-CM

## 2013-05-23 DIAGNOSIS — Z96659 Presence of unspecified artificial knee joint: Secondary | ICD-10-CM | POA: Diagnosis not present

## 2013-05-23 DIAGNOSIS — M25569 Pain in unspecified knee: Secondary | ICD-10-CM | POA: Diagnosis not present

## 2013-05-28 DIAGNOSIS — M25569 Pain in unspecified knee: Secondary | ICD-10-CM | POA: Diagnosis not present

## 2013-05-29 DIAGNOSIS — C50919 Malignant neoplasm of unspecified site of unspecified female breast: Secondary | ICD-10-CM | POA: Insufficient documentation

## 2013-05-29 NOTE — Progress Notes (Signed)
Patient ID: Dawn Diaz, female   DOB: [birth year], 79 y.o.   MRN: [id number]        HISTORY & PHYSICAL  DATE: 04/24/2013     FACILITY: [facility]  LEVEL OF CARE: SNF (31)  ALLERGIES:  Allergies  Allergen Reactions  . Amoxicillin   . Latex Itching    CHIEF COMPLAINT:  Manage right knee osteoarthritis, constipation, and breast cancer.    HISTORY OF PRESENT ILLNESS:  The patient is an 79 year-old, Caucasian female.    KNEE OSTEOARTHRITIS: Patient had a history of pain and functional disability in the knee due to end-stage osteoarthritis and has failed nonsurgical conservative treatments. Patient had worsening of pain with activity and weight bearing, pain that interfered with activities of daily living & pain with passive range of motion. Therefore patient underwent total knee arthroplasty and tolerated the procedure well. Patient is admitted to this facility for sort short-term rehabilitation. Patient denies knee pain.    CONSTIPATION: The constipation remains stable. No complications from the medications presently being used. Patient denies ongoing constipation, abdominal pain, nausea or vomiting.    BREAST CANCER:  The patient has a history of stage I breast cancer which was recurrent.  She is status post right mastectomy.  She is currently on Tamoxifen and tolerates it without any side effects.   PAST MEDICAL HISTORY :  Past Medical History  Diagnosis Date  . Irritable bowel syndrome   . Diverticulitis   . GERD (gastroesophageal reflux disease)   . Ovarian cyst   . Bunion   . Osteoporosis   . Anxiety disorder   . Depression   . Hiatal hernia   . Glaucoma   . Bacterial overgrowth syndrome   . Breast cancer, stage 1     '97/recurrent '05 right breast(s/p mastectomy)  . History of TIAs     - 13 yrs ago"brief memory loss"    PAST SURGICAL HISTORY: Past Surgical History  Procedure Laterality Date  . Mastectomy  1997    Right  . Abdominal  hysterectomy    . Tonsillectomy    . Breast biopsy      right  . Cataract extraction Left   . Bunionectomy Bilateral     both, repeat x1  . Total knee arthroplasty Right 04/15/2013    Procedure: RIGHT TOTAL KNEE ARTHROPLASTY;  Surgeon: Loanne Drilling, MD;  Location: [facility];  Service: Orthopedics;  Laterality: Right;    SOCIAL HISTORY:  reports that she quit smoking about 62 years ago. Her smoking use included Cigarettes. She smoked 0.00 packs per day for 3 years. She has never used smokeless tobacco. She reports that she does not drink alcohol or use illicit drugs.  FAMILY HISTORY:  Family History  Problem Relation Age of Onset  . Colon cancer Sister 39    Died at 56  . Kidney cancer Father   . Pancreatic cancer Mother     CURRENT MEDICATIONS: Reviewed per [facility]  REVIEW OF SYSTEMS:  See HPI otherwise 14 point ROS is negative.  PHYSICAL EXAMINATION  VS:  T 98       P 64      RR 20      BP 126/64      POX 98%        WT (Lb)  GENERAL: no acute distress, normal body habitus EYES: conjunctivae normal, sclerae normal, normal eye lids MOUTH/THROAT: lips without lesions,no lesions in the mouth,tongue is without lesions,uvula elevates in midline  NECK: supple, trachea midline, no neck masses, no thyroid tenderness, no thyromegaly LYMPHATICS: no LAN in the neck, no supraclavicular LAN RESPIRATORY: breathing is even & unlabored, BS CTAB CARDIAC: RRR, no murmur,no extra heart sounds, no edema GI:  ABDOMEN: abdomen soft, normal BS, no masses, no tenderness  LIVER/SPLEEN: no hepatomegaly, no splenomegaly MUSCULOSKELETAL: HEAD: normal to inspection & palpation BACK: no kyphosis, scoliosis or spinal processes tenderness EXTREMITIES: LEFT UPPER EXTREMITY: full range of motion, normal strength & tone RIGHT UPPER EXTREMITY:  full range of motion, normal strength & tone LEFT LOWER EXTREMITY: strength intact, range of motion moderate   RIGHT LOWER EXTREMITY: strength intact, range of motion  not tested due to surgery   PSYCHIATRIC: the patient is alert & oriented to person, affect & behavior appropriate  LABS/RADIOLOGY: Right knee x-ray:  Showed bone-on-bone arthritis in medial and patellofemoral compartments.    Urinalysis negative.    MRSA by PCR negative.     Staph aureus by PCR negative.    Labs reviewed: Basic Metabolic Panel:  Recent Labs  16/10/96 1100 04/16/13 0450 04/17/13 0508  NA 136 135 137  K 4.4 3.8 3.6  CL 101 102 105  CO2 30 23 28   GLUCOSE 111* 144* 108*  BUN 14 12 12   CREATININE 0.79 0.71 0.88  CALCIUM 9.1 8.5 8.3*   Liver Function Tests:  Recent Labs  04/10/13 1100  AST 28  ALT 14  ALKPHOS 66  BILITOT 0.4  PROT 6.8  ALBUMIN 3.4*   CBC:  Recent Labs  04/16/13 0450 04/17/13 0508 04/18/13 0458  WBC 11.0* 9.2 7.5  HGB 12.2 10.5* 10.6*  HCT 35.9* 31.2* 32.0*  MCV 88.6 88.9 89.1  PLT 197 187 175   ASSESSMENT/PLAN:  Right knee osteoarthritis.  Status post right total knee arthroplasty.  Continue rehabilitation.    Constipation.  Well controlled.    Breast cancer.  Continue Tamoxifen.    Therapy is clearing the patient for discharge tomorrow and patient is safe to go.    I have reviewed patient's medical records received at admission/from hospitalization.  CPT CODE: 04540

## 2013-06-04 DIAGNOSIS — M549 Dorsalgia, unspecified: Secondary | ICD-10-CM | POA: Diagnosis not present

## 2013-06-04 DIAGNOSIS — M5137 Other intervertebral disc degeneration, lumbosacral region: Secondary | ICD-10-CM | POA: Diagnosis not present

## 2013-06-04 DIAGNOSIS — M4800 Spinal stenosis, site unspecified: Secondary | ICD-10-CM | POA: Diagnosis not present

## 2013-06-04 DIAGNOSIS — M4120 Other idiopathic scoliosis, site unspecified: Secondary | ICD-10-CM | POA: Diagnosis not present

## 2013-06-05 DIAGNOSIS — Z85828 Personal history of other malignant neoplasm of skin: Secondary | ICD-10-CM | POA: Diagnosis not present

## 2013-06-05 DIAGNOSIS — I872 Venous insufficiency (chronic) (peripheral): Secondary | ICD-10-CM | POA: Diagnosis not present

## 2013-06-05 DIAGNOSIS — L719 Rosacea, unspecified: Secondary | ICD-10-CM | POA: Diagnosis not present

## 2013-06-10 DIAGNOSIS — M5137 Other intervertebral disc degeneration, lumbosacral region: Secondary | ICD-10-CM | POA: Diagnosis not present

## 2013-06-15 ENCOUNTER — Other Ambulatory Visit: Payer: Self-pay | Admitting: Oncology

## 2013-06-15 DIAGNOSIS — Z853 Personal history of malignant neoplasm of breast: Secondary | ICD-10-CM

## 2013-06-17 DIAGNOSIS — M549 Dorsalgia, unspecified: Secondary | ICD-10-CM | POA: Diagnosis not present

## 2013-06-18 DIAGNOSIS — E559 Vitamin D deficiency, unspecified: Secondary | ICD-10-CM | POA: Diagnosis not present

## 2013-06-18 DIAGNOSIS — Z79899 Other long term (current) drug therapy: Secondary | ICD-10-CM | POA: Diagnosis not present

## 2013-06-24 DIAGNOSIS — M25549 Pain in joints of unspecified hand: Secondary | ICD-10-CM | POA: Diagnosis not present

## 2013-06-24 DIAGNOSIS — M1710 Unilateral primary osteoarthritis, unspecified knee: Secondary | ICD-10-CM | POA: Diagnosis not present

## 2013-06-24 DIAGNOSIS — M5030 Other cervical disc degeneration, unspecified cervical region: Secondary | ICD-10-CM | POA: Diagnosis not present

## 2013-06-24 DIAGNOSIS — M069 Rheumatoid arthritis, unspecified: Secondary | ICD-10-CM | POA: Diagnosis not present

## 2013-06-24 DIAGNOSIS — Z09 Encounter for follow-up examination after completed treatment for conditions other than malignant neoplasm: Secondary | ICD-10-CM | POA: Diagnosis not present

## 2013-07-01 DIAGNOSIS — M545 Low back pain, unspecified: Secondary | ICD-10-CM | POA: Diagnosis not present

## 2013-07-01 DIAGNOSIS — M5137 Other intervertebral disc degeneration, lumbosacral region: Secondary | ICD-10-CM | POA: Diagnosis not present

## 2013-07-01 DIAGNOSIS — M4800 Spinal stenosis, site unspecified: Secondary | ICD-10-CM | POA: Diagnosis not present

## 2013-07-01 DIAGNOSIS — M4120 Other idiopathic scoliosis, site unspecified: Secondary | ICD-10-CM | POA: Diagnosis not present

## 2013-07-02 ENCOUNTER — Ambulatory Visit
Admission: RE | Admit: 2013-07-02 | Discharge: 2013-07-02 | Disposition: A | Discharge status: Home / Self Care | Payer: Medicare Other | Source: Ambulatory Visit

## 2013-07-02 DIAGNOSIS — Z1231 Encounter for screening mammogram for malignant neoplasm of breast: Secondary | ICD-10-CM

## 2013-08-14 DIAGNOSIS — H409 Unspecified glaucoma: Secondary | ICD-10-CM | POA: Diagnosis not present

## 2013-08-14 DIAGNOSIS — H259 Unspecified age-related cataract: Secondary | ICD-10-CM | POA: Diagnosis not present

## 2013-08-14 DIAGNOSIS — H4011X0 Primary open-angle glaucoma, stage unspecified: Secondary | ICD-10-CM | POA: Diagnosis not present

## 2013-08-14 DIAGNOSIS — H5310 Unspecified subjective visual disturbances: Secondary | ICD-10-CM | POA: Diagnosis not present

## 2013-08-16 DIAGNOSIS — R5381 Other malaise: Secondary | ICD-10-CM | POA: Diagnosis not present

## 2013-08-16 DIAGNOSIS — R5383 Other fatigue: Secondary | ICD-10-CM | POA: Diagnosis not present

## 2013-08-29 DIAGNOSIS — M1710 Unilateral primary osteoarthritis, unspecified knee: Secondary | ICD-10-CM | POA: Diagnosis not present

## 2013-09-11 DIAGNOSIS — M1710 Unilateral primary osteoarthritis, unspecified knee: Secondary | ICD-10-CM | POA: Diagnosis not present

## 2013-09-12 ENCOUNTER — Other Ambulatory Visit: Payer: Self-pay | Admitting: Oncology

## 2013-09-12 DIAGNOSIS — H35369 Drusen (degenerative) of macula, unspecified eye: Secondary | ICD-10-CM | POA: Diagnosis not present

## 2013-09-12 DIAGNOSIS — H409 Unspecified glaucoma: Secondary | ICD-10-CM | POA: Diagnosis not present

## 2013-09-12 DIAGNOSIS — H4010X0 Unspecified open-angle glaucoma, stage unspecified: Secondary | ICD-10-CM | POA: Diagnosis not present

## 2013-09-12 DIAGNOSIS — Z853 Personal history of malignant neoplasm of breast: Secondary | ICD-10-CM

## 2013-09-12 DIAGNOSIS — Z79899 Other long term (current) drug therapy: Secondary | ICD-10-CM | POA: Diagnosis not present

## 2013-09-25 ENCOUNTER — Ambulatory Visit (INDEPENDENT_AMBULATORY_CARE_PROVIDER_SITE_OTHER): Payer: Medicare Other

## 2013-09-25 VITALS — BP 139/66 | HR 75 | Resp 18

## 2013-09-25 DIAGNOSIS — R52 Pain, unspecified: Secondary | ICD-10-CM

## 2013-09-25 DIAGNOSIS — M779 Enthesopathy, unspecified: Secondary | ICD-10-CM

## 2013-09-25 DIAGNOSIS — M2010 Hallux valgus (acquired), unspecified foot: Secondary | ICD-10-CM

## 2013-09-25 DIAGNOSIS — M2040 Other hammer toe(s) (acquired), unspecified foot: Secondary | ICD-10-CM

## 2013-09-25 DIAGNOSIS — M7750 Other enthesopathy of unspecified foot: Secondary | ICD-10-CM | POA: Diagnosis not present

## 2013-09-25 DIAGNOSIS — M778 Other enthesopathies, not elsewhere classified: Secondary | ICD-10-CM

## 2013-09-25 DIAGNOSIS — M069 Rheumatoid arthritis, unspecified: Secondary | ICD-10-CM

## 2013-09-25 NOTE — Patient Instructions (Signed)
Bunionectomy A bunionectomy is surgery to remove a bunion. A bunion is an enlargement of the joint at the base of the big toe. It is made up of bone and soft tissue on the inside part of the joint. Over time, a painful lump appears on the inside of the joint. The big toe begins to point inward toward the second toe. New bone growth can occur and a bone spur may form. The pain eventually causes difficulty walking. A bunion usually results from inflammation caused by the irritation of poorly fitting shoes. It often begins later in life. A bunionectomy is performed when nonsurgical treatment no longer works. When surgery is needed, the extent of the procedure will depend on the degree of deformity of the foot. Your surgeon will discuss with you the different procedures and what will work best for you depending on your age and health. LET YOUR CAREGIVER KNOW ABOUT:   Previous problems with anesthetics or medicines used to numb the skin.  Allergies to dyes, iodine, foods, and/or latex.  Medicines taken including herbs, eye drops, prescription medicines (especially medicines used to "thin the blood"), aspirin and other over-the-counter medicines, and steroids (by mouth or as a cream).  History of bleeding or blood problems.  Possibility of pregnancy, if this applies.  History of blood clots in your legs and/or lungs .  Previous surgery.  Other important health problems. RISKS AND COMPLICATIONS   Infection.  Pain.  Nerve damage.  Possibility that the bunion will recur. BEFORE THE PROCEDURE  You should be present 60 minutes prior to your procedure or as directed.  PROCEDURE  Surgery is often done so that you can go home the same day (outpatient). It may be done in a hospital or in an outpatient surgical center. An anesthetic will be used to help you sleep during the procedure. Sometimes, a spinal anesthetic is used to make you numb below the waist. A cut (incision) is made over the swollen  area at the first joint of the big toe. The enlarged lump will be removed. If there is a need to reposition the bones of the big toe, this may require more than 1 incision. The bone itself may need to be cut. Screws and wires may be used in the repair. These can be removed at a later date. In severe cases, the entire joint may need to be removed and a joint replacement inserted. When done, the incision is closed with stitches (sutures). Skin adhesive strips may be added for reinforcement. They help hold the incision closed.  AFTER THE PROCEDURE  Compression bandages (dressings) are then wrapped around the wound. This helps to keep the foot in alignment and reduce swelling. Your foot will be monitored for bleeding and swelling. You will need to stay for a few hours in the recovery area before being discharged. This allows time for the anesthesia to wear off. You will be discharged home when you are awake, stable, and doing well. HOME CARE INSTRUCTIONS   You can expect to return to normal activities within 6 to 8 weeks after surgery. The foot is at increased risk for swelling for several months. When you can expect to bear weight on the operated foot will depend on the extent of your surgery. The milder the deformity, the less tissue is removed and the sooner the return to normal activity level. During the recovery period, a special shoe, boot, or cast may be worn to accommodate the surgical bandage and to help provide stability   to the foot.  Once you are home, an ice pack applied to the operative site may help with discomfort and keep swelling down. Stop using the ice if it causes discomfort.  Keep your feet raised (elevated) when possible to lessen swelling.  If you have an elastic bandage on your foot and you have numbness, tingling, or your foot becomes cold and blue, adjust the bandage to make it comfortable.  Change dressings as directed.  Keep the wound dry and clean. The wound may be washed  gently with soap and water. Gently blot dry without rubbing. Do not take baths or use swimming pools or hot tubs for 10 days, or as instructed by your caregiver.  Only take over-the-counter or prescription medicines for pain, discomfort, or fever as directed by your caregiver.  You may continue a normal diet as directed.  For activity, use crutches with no weight bearing or your orthopedic shoe as directed. Continue to use crutches or a cane as directed until you can stand without causing pain. SEEK MEDICAL CARE IF:   You have redness, swelling, bruising, or increasing pain in the wound.  There is pus coming from the wound.  You have drainage from a wound lasting longer than 1 day.  You have an oral temperature above 102 F (38.9 C).  You notice a bad smell coming from the wound or dressing.  The wound breaks open after sutures have been removed.  You develop dizzy episodes or fainting while standing.  You have persistent nausea or vomiting.  Your toes become cold.  Pain is not relieved with medicines. SEEK IMMEDIATE MEDICAL CARE IF:   You develop a rash.  You have difficulty breathing.  You develop any reaction or side effects to medicines given.  Your toes are numb or blue, or you have severe pain. MAKE SURE YOU:   Understand these instructions.  Will watch your condition.  Will get help right away if you are not doing well or get worse. Document Released: 05/20/2005 Document Revised: 08/29/2011 Document Reviewed: 06/25/2007 [employer] Patient Information 2014 [employer].  

## 2013-09-25 NOTE — Progress Notes (Signed)
Subjective:    Patient ID: Dawn Diaz, female    DOB: 1926/10/17, 80 y.o.   MRN: [id number]  HPI my right foot is  bothering me and has been going on for a couple of months  and I can only and I can only wear two pairs of shoes and is 2nd toe and rubs against the shoe and on left foot and I was walking to fast and the next day it was worse and that has been going on for four days and I am taken Plaqunal 200 mg and Lantana prost 2.5    Review of Systems  HENT: Positive for sinus pressure.   Cardiovascular:       Calf pain with walking  Musculoskeletal:       Joint pain and back pain  Allergic/Immunologic: Positive for environmental allergies.  Neurological: Positive for weakness.  Hematological: Bruises/bleeds easily.  Psychiatric/Behavioral: The patient is nervous/anxious.   All other systems reviewed and are negative.      Objective:   Physical Exam 80 year old white female well-developed well-nourished white x3 vital signs stable presents at this time with complaint of painful toes particular hammertoe on her right foot in pain in her left mid arch area patient been wearing orthotics for 6 years ago further worn likely needs replacing some point in the future she also wearing a tracks orthotics that she got a tissue market. These orthotics are stable but may need replacing with new functional orthoses in the future. Neurovascular status is intact pedal pulses palpable epicritic and proprioceptive sensations intact and symmetric bilateral normal plantar response and DTRs patient does have notable bunion deformity right has a bunion correction left foot has had my surgery left twice a surgeon the right foot once still has residual lateral deviation of the hallux and overlapping second digit with hammertoe deformities 234 and 5 patient is a history of rheumatoid arthritis well likely contributing to the digits deformities the left foot appears rectus the forefoot however has some for 3  changes on weightbearing and clinically and radiographically. Left foot also shows radiographic findings of right foot radiograph I. as the bunion deformity angle 30 plus abductus angle 25 there is adductovarus rotation lesser digits and hammertoe second at this time patient is it was discussed surgery would be K. for likely [place] bunionectomy and hammertoe repair second on the right foot however she recently had knee surgery on her right knee and assisted off a small appears to be ganglion cyst inferior to the incision area just below the knee she's been treated has been drained twice in its been followed up may have to redo on that surgery I suggested that prior to considering her bunion surgery her for surgery which she would need to wear an air fracture boot for at least a month she should have been the result completely and then be able to follow through on the surgery appropriately again she asked about is having hammertoe repair advised him to corrected bunion and hammertoe to allow for space for the toe.       Assessment & Plan:  Assessment this time rheumatoid arthropathy with digital contractures patient would benefit from new orthotic some point in the future fibrin slough affected she immediate surgery for bunion correction hammertoe correction on her right foot in the near future will wait on her thoughts until after bunion corrections and been obtained. She will we have the bunion correction her knee is resolved and the ganglion cyst is resolved  on her right knee. Assessments hallux abductovalgus deformity and hammertoe deformities with associated capsulitis some tube foam pads are dispensed for her toes patient is advised to continue wearing her previous orthotics which she still have adequate where available to themt  Harriet Masson DPM

## 2013-09-30 ENCOUNTER — Ambulatory Visit (HOSPITAL_BASED_OUTPATIENT_CLINIC_OR_DEPARTMENT_OTHER): Payer: Medicare Other | Admitting: Oncology

## 2013-09-30 ENCOUNTER — Telehealth: Payer: Self-pay | Admitting: Oncology

## 2013-09-30 VITALS — BP 139/60 | HR 67 | Temp 97.0°F | Resp 18 | Ht 68.5 in | Wt 133.0 lb

## 2013-09-30 DIAGNOSIS — Z853 Personal history of malignant neoplasm of breast: Secondary | ICD-10-CM

## 2013-09-30 DIAGNOSIS — Z170 Estrogen receptor positive status [ER+]: Secondary | ICD-10-CM | POA: Diagnosis not present

## 2013-09-30 DIAGNOSIS — C50919 Malignant neoplasm of unspecified site of unspecified female breast: Secondary | ICD-10-CM | POA: Diagnosis not present

## 2013-09-30 NOTE — Progress Notes (Signed)
  [facility] OFFICE PROGRESS NOTE   Diagnosis: Breast cancer  INTERVAL HISTORY:   Ms. Weekes returns as scheduled. She continues tamoxifen. No change over the right chest wall. No hot flashes or symptoms of thromboembolic disease. A left mammogram 07/02/2013 was negative.  She underwent a right knee replacement in October of 2014. There is a small recurrent fluid collection inferior to the right knee. Than the remains "warm ". No leg swelling.  Objective:  Vital signs in last 24 hours:  Blood pressure 139/60, pulse 67, temperature 97 F (36.1 C), temperature source Oral, resp. rate 18, height 5' 8.5" (1.74 m), weight 133 lb (60.328 kg), SpO2 98.00%.    HEENT: Neck without mass Lymphatics: No cervical or supraclavicular nodes. 1/2 cm soft mobile bilateral axillary nodes. Resp: Lungs clear bilaterally Cardio: Regular rate and rhythm GI: No hepatomegaly Vascular: No leg edema, no arm edema Breast: Left breast without mass. Status post right mastectomy. No chest wall nodules. Radiation telangiectasias over the right chest wall  Musculoskeletal: The right knee is slightly warm. Inferior to the right knee incision there is a 1 cm soft rounded fluctuant cutaneous area. No surrounding erythema.   Medications: I have reviewed the patient's current medications.  Assessment/Plan: 1. Extensive DCIS of the right breast diagnosed in 1997, status post a right mastectomy. 2. Chest wall recurrence with invasive breast cancer (ER positive, PR positive and HER-2/neu positive), status post removal of the right breast implant and a wide excision procedure in January 2009 with negative surgical margins. She began Arimidex in April 2009. She completed adjuvant radiation to the right chest wall. She was unable to tolerate the Arimidex due to arthralgias. She was switched to tamoxifen in July 2009. 3. Tiny lung nodule on a CT of the chest in December 2008 - likely a benign  finding. 4. History of multiple thyroid nodules -  followed by Dr Harlow Asa. 5. Extensive family history of cancer. 6. Arthralgias secondary to Arimidex and Aromasin - she continues followup with Dr Estanislado Pandy for "arthritis." She reports being diagnosed with rheumatoid arthritis. There has been clinical improvement with Plaquenil. 7. Status post right knee replacement October 2014  Disposition:  Ms. Shepheard appears well. No clinical evidence for progression of breast cancer. She will continue tamoxifen. She will continue yearly mammography. Ms. Gu will return for an office visit in 9 months.  She plans to see Dr. Wynelle Link for continued management of the right knee fluid collection.  Ladell Pier, MD  09/30/2013  3:47 PM

## 2013-09-30 NOTE — Telephone Encounter (Signed)
Gave rpt appt for lab and MD for  January 2016

## 2013-10-04 DIAGNOSIS — N390 Urinary tract infection, site not specified: Secondary | ICD-10-CM | POA: Diagnosis not present

## 2013-10-09 DIAGNOSIS — M6740 Ganglion, unspecified site: Secondary | ICD-10-CM | POA: Diagnosis not present

## 2013-10-09 DIAGNOSIS — Z96659 Presence of unspecified artificial knee joint: Secondary | ICD-10-CM | POA: Diagnosis not present

## 2013-11-06 DIAGNOSIS — N390 Urinary tract infection, site not specified: Secondary | ICD-10-CM | POA: Diagnosis not present

## 2013-11-12 DIAGNOSIS — Z96659 Presence of unspecified artificial knee joint: Secondary | ICD-10-CM | POA: Diagnosis not present

## 2013-11-12 DIAGNOSIS — R5381 Other malaise: Secondary | ICD-10-CM | POA: Diagnosis not present

## 2013-11-12 DIAGNOSIS — Z471 Aftercare following joint replacement surgery: Secondary | ICD-10-CM | POA: Diagnosis not present

## 2013-11-12 DIAGNOSIS — Z79899 Other long term (current) drug therapy: Secondary | ICD-10-CM | POA: Diagnosis not present

## 2013-11-18 ENCOUNTER — Telehealth: Payer: Self-pay | Admitting: Genetic Counselor

## 2013-11-18 NOTE — Telephone Encounter (Signed)
S/W PATIENT AND GAVE [facility] APPT FOR 07/08 @ 10 W/GENETIC COUNSELOR.  DX- BREAST CA CALENDAR MAILED.

## 2013-11-25 DIAGNOSIS — Z09 Encounter for follow-up examination after completed treatment for conditions other than malignant neoplasm: Secondary | ICD-10-CM | POA: Diagnosis not present

## 2013-11-25 DIAGNOSIS — M1710 Unilateral primary osteoarthritis, unspecified knee: Secondary | ICD-10-CM | POA: Diagnosis not present

## 2013-11-25 DIAGNOSIS — M069 Rheumatoid arthritis, unspecified: Secondary | ICD-10-CM | POA: Diagnosis not present

## 2013-11-25 DIAGNOSIS — M19049 Primary osteoarthritis, unspecified hand: Secondary | ICD-10-CM | POA: Diagnosis not present

## 2013-11-27 DIAGNOSIS — E041 Nontoxic single thyroid nodule: Secondary | ICD-10-CM | POA: Diagnosis not present

## 2013-11-27 DIAGNOSIS — M129 Arthropathy, unspecified: Secondary | ICD-10-CM | POA: Diagnosis not present

## 2013-11-27 DIAGNOSIS — E559 Vitamin D deficiency, unspecified: Secondary | ICD-10-CM | POA: Diagnosis not present

## 2013-11-27 DIAGNOSIS — E063 Autoimmune thyroiditis: Secondary | ICD-10-CM | POA: Diagnosis not present

## 2013-11-27 DIAGNOSIS — Z79899 Other long term (current) drug therapy: Secondary | ICD-10-CM | POA: Diagnosis not present

## 2013-12-02 ENCOUNTER — Encounter: Payer: Self-pay | Admitting: Podiatry

## 2013-12-02 ENCOUNTER — Ambulatory Visit (INDEPENDENT_AMBULATORY_CARE_PROVIDER_SITE_OTHER): Payer: Medicare Other | Admitting: Podiatry

## 2013-12-02 VITALS — BP 131/71 | HR 73 | Resp 16

## 2013-12-02 DIAGNOSIS — M7750 Other enthesopathy of unspecified foot: Secondary | ICD-10-CM | POA: Diagnosis not present

## 2013-12-02 MED ORDER — TRIAMCINOLONE ACETONIDE 10 MG/ML IJ SUSP
10.0000 mg | Freq: Once | INTRAMUSCULAR | Status: AC
  Administered 2013-12-02: 10 mg

## 2013-12-02 NOTE — Progress Notes (Signed)
Subjective:     Patient ID: Dawn Diaz, female   DOB: March 22, 1927, 80 y.o.   MRN: [id number]  HPI patient presents stating I have a lot of pain in my left foot at the insertion of this tendon and I think I need new orthotics   Review of Systems     Objective:   Physical Exam Neurovascular status is intact with inflammation around the posterior tibial insertion left with fluid buildup but no indications of muscle compromise    Assessment:     Tendinitis of the left posterior tibial insertion with significant dropping of the arch noted left over right    Plan:     Reviewed condition and did careful injection of the left posterior tibial insertion advised on reduced activity for several days. Scanned for custom orthotics to help provide support for the plantar arch

## 2013-12-05 DIAGNOSIS — Z1331 Encounter for screening for depression: Secondary | ICD-10-CM | POA: Diagnosis not present

## 2013-12-05 DIAGNOSIS — Z23 Encounter for immunization: Secondary | ICD-10-CM | POA: Diagnosis not present

## 2013-12-05 DIAGNOSIS — E041 Nontoxic single thyroid nodule: Secondary | ICD-10-CM | POA: Diagnosis not present

## 2013-12-05 DIAGNOSIS — R002 Palpitations: Secondary | ICD-10-CM | POA: Diagnosis not present

## 2013-12-05 DIAGNOSIS — Z0000 Encounter for general adult medical examination without abnormal findings: Secondary | ICD-10-CM | POA: Diagnosis not present

## 2013-12-05 DIAGNOSIS — E559 Vitamin D deficiency, unspecified: Secondary | ICD-10-CM | POA: Diagnosis not present

## 2013-12-05 DIAGNOSIS — M129 Arthropathy, unspecified: Secondary | ICD-10-CM | POA: Diagnosis not present

## 2013-12-05 DIAGNOSIS — K589 Irritable bowel syndrome without diarrhea: Secondary | ICD-10-CM | POA: Diagnosis not present

## 2013-12-05 DIAGNOSIS — M722 Plantar fascial fibromatosis: Secondary | ICD-10-CM | POA: Diagnosis not present

## 2013-12-05 DIAGNOSIS — M069 Rheumatoid arthritis, unspecified: Secondary | ICD-10-CM | POA: Diagnosis not present

## 2013-12-10 DIAGNOSIS — Z1212 Encounter for screening for malignant neoplasm of rectum: Secondary | ICD-10-CM | POA: Diagnosis not present

## 2013-12-11 ENCOUNTER — Other Ambulatory Visit: Payer: Self-pay | Admitting: Oncology

## 2013-12-11 DIAGNOSIS — C50919 Malignant neoplasm of unspecified site of unspecified female breast: Secondary | ICD-10-CM

## 2013-12-25 ENCOUNTER — Ambulatory Visit (HOSPITAL_BASED_OUTPATIENT_CLINIC_OR_DEPARTMENT_OTHER): Payer: Medicare Other | Admitting: Genetic Counselor

## 2013-12-25 ENCOUNTER — Other Ambulatory Visit: Payer: Medicare Other

## 2013-12-25 DIAGNOSIS — Z800 Family history of malignant neoplasm of digestive organs: Secondary | ICD-10-CM | POA: Insufficient documentation

## 2013-12-25 DIAGNOSIS — C50919 Malignant neoplasm of unspecified site of unspecified female breast: Secondary | ICD-10-CM

## 2013-12-25 DIAGNOSIS — Z853 Personal history of malignant neoplasm of breast: Secondary | ICD-10-CM | POA: Diagnosis not present

## 2013-12-25 NOTE — Progress Notes (Signed)
HISTORY OF PRESENT ILLNESS: Dr. Virgina Jock requested a cancer genetics consultation for Dawn Diaz, a 80 y.o. female, due to a personal and family history of cancer.  Dawn Diaz presents to clinic today to discuss the possibility of a hereditary predisposition to cancer, genetic testing, and to further clarify her future cancer risks, as well as potential cancer risk for family members.   PAST MEDICAL HX:  Extensive DCIS of the right breast diagnosed in 1997, status post a right mastectomy. Chest wall recurrence with invasive breast cancer (ER positive, PR positive and HER-2/neu positive), status post removal of the right breast implant and a wide excision procedure in January 2009 with negative surgical margins. She began Arimidex in April 2009. She completed adjuvant radiation to the right chest wall. She was unable to tolerate the Arimidex due to arthralgias. She was switched to tamoxifen in July 2009. Tiny lung nodule on a CT of the chest in December 2008 - likely a benign finding        Irritable bowel syndrome    Diverticulitis    GERD (gastroesophageal reflux disease)    Ovarian cyst    Bunion    Osteoporosis    Anxiety disorder    Depression    Hiatal hernia    Glaucoma    Bacterial overgrowth syndrome    Breast cancer, stage 1     '97/recurrent '05 right breast(s/p mastectomy)   History of TIAs     - 13 yrs ago"brief memory loss"    Past Surgical History  Procedure Laterality Date   Mastectomy  1997    Right   Abdominal hysterectomy     Tonsillectomy     Breast biopsy      right   Cataract extraction Left    Bunionectomy Bilateral     both, repeat x1   Total knee arthroplasty Right 04/15/2013    Procedure: RIGHT TOTAL KNEE ARTHROPLASTY;  Surgeon: Gearlean Alf, MD;  Location: [facility];  Service: Orthopedics;  Laterality: Right;   History   Social History   Marital Status: Married    Spouse Name: N/A    Number of Children: 3   Years of  Education: N/A   Occupational History   Retired    Social History Main Topics   Smoking status: Former Smoker -- 3 years    Types: Cigarettes    Quit date: 04/11/1951   Smokeless tobacco: Never Used   Alcohol Use: No   Drug Use: No   Sexual Activity: Not Currently   Other Topics Concern   Not on file   Social History Narrative   No narrative on file     FAMILY HISTORY:  During the visit, a 4-generation pedigree was obtained. Significant diagnoses include the following:  Family History  Problem Relation Age of Onset   Colon cancer Sister 61    Died at 61   Kidney cancer Father 81   Pancreatic cancer Mother 28   Pancreatic cancer Maternal Grandmother 22   Cancer Paternal Grandfather     died from "abdominal ca" at young age    Dawn Diaz ancestry is of Caucasian descent. There is no known Jewish ancestry or consanguinity.  GENETIC COUNSELING ASSESSMENT: Dawn Diaz is a 80 y.o. female with a personal and family history of cancer. We, therefore, discussed and recommended the following at today's visit.   DISCUSSION: We reviewed the characteristics, features and inheritance patterns of hereditary cancer syndromes. We also discussed genetic testing, including the appropriate  family members to test, the process of testing, insurance coverage and turn-around-time for results. We discussed the implications of a negative, positive and/or variant of uncertain significant result. We discussed with Dawn Diaz that the family history is not highly consistent with a familial hereditary cancer syndrome, and we feel in general she is at low risk to harbor a gene mutation associated with such a condition. She also does not meet Medicare criteria for genetic testing. That said, Dawn Diaz was still interested in pursuing genetic testing for BRCA1/2 and wished to do this test today, even though she may be required to pay out of pocket for the test.   PLAN: Based on our above  discussion, Dawn Diaz wished to pursue genetic testing and the blood sample was drawn and will be sent to [facility] for analysis of the BRCA1 and BCRA2 genes. Results should be available within approximately 3 weeks time, at which point they will be disclosed by telephone to Dawn Diaz, as will any additional recommendations warranted by these results. We also encouraged Dawn Diaz to remain in contact with cancer genetics annually so that we can continuously update the family history and inform her of any changes in cancer genetics and testing that may be of benefit for this family. [name] questions were answered to her satisfaction today. Our contact information was provided should additional questions or concerns arise.   Thank you for the referral and allowing [place] to share in the care of your patient.   The patient was seen for a total of 45 minutes in face-to-face genetic counseling.  This patient was discussed with Benay Spice who agrees with the above.    _______________________________________________________________________ For Office Staff:  Number of people involved in session: 2 Was an Intern/ student involved with case: no

## 2013-12-27 ENCOUNTER — Ambulatory Visit (INDEPENDENT_AMBULATORY_CARE_PROVIDER_SITE_OTHER): Payer: Medicare Other | Admitting: *Deleted

## 2013-12-27 DIAGNOSIS — M069 Rheumatoid arthritis, unspecified: Secondary | ICD-10-CM

## 2013-12-27 NOTE — Patient Instructions (Signed)

## 2013-12-27 NOTE — Progress Notes (Signed)
   Subjective:    Patient ID: Dawn Diaz, female    DOB: Jul 08, 1926, 80 y.o.   MRN: [id number]  HPI PICK UP ORTHOTICS AND GIVEN INSTRUCTION.   Review of Systems     Objective:   Physical Exam        Assessment & Plan:

## 2014-01-01 ENCOUNTER — Ambulatory Visit: Payer: Medicare Other | Admitting: Podiatry

## 2014-01-14 ENCOUNTER — Telehealth: Payer: Self-pay

## 2014-01-14 DIAGNOSIS — H04129 Dry eye syndrome of unspecified lacrimal gland: Secondary | ICD-10-CM | POA: Diagnosis not present

## 2014-01-14 DIAGNOSIS — H2640 Unspecified secondary cataract: Secondary | ICD-10-CM | POA: Diagnosis not present

## 2014-01-14 DIAGNOSIS — H16109 Unspecified superficial keratitis, unspecified eye: Secondary | ICD-10-CM | POA: Diagnosis not present

## 2014-01-14 NOTE — Telephone Encounter (Signed)
error 

## 2014-01-14 NOTE — Telephone Encounter (Signed)
Faxed dispensing order to [employer] [employer] for L8000 and L8030.  Sent to scan.

## 2014-01-28 ENCOUNTER — Encounter: Payer: Self-pay | Admitting: Genetic Counselor

## 2014-01-28 DIAGNOSIS — Z800 Family history of malignant neoplasm of digestive organs: Secondary | ICD-10-CM

## 2014-01-28 DIAGNOSIS — Z853 Personal history of malignant neoplasm of breast: Secondary | ICD-10-CM

## 2014-01-28 DIAGNOSIS — C50919 Malignant neoplasm of unspecified site of unspecified female breast: Secondary | ICD-10-CM

## 2014-01-28 NOTE — Progress Notes (Signed)
HPI:  Dawn Diaz was previously seen in the [facility] due to a personal and family history of cancer and concerns regarding a hereditary predisposition to cancer. Please refer to our prior cancer genetics clinic note for more information regarding Dawn Diaz's medical, social and family histories, and our assessment and recommendations, at the time. Dawn Diaz recent genetic test results were disclosed to her, as were recommendations warranted by these results. These results and recommendations are discussed in more detail below.  GENETIC TEST RESULTS: At the time of Dawn Diaz visit, we recommended she pursue genetic testing of the BRCA1 and BRCA2 genes. This test, which included sequencing and deletion/duplication analysis of the genes, was performed at [facility]. Genetic testing was normal, and did not reveal a mutation in these genes.   We discussed with Dawn Diaz that since the current genetic testing is not perfect, it is possible there may be a gene mutation in one of these genes that current testing cannot detect, but that chance is small.  We also discussed, that it is possible that another gene that has not yet been discovered, or that we have not yet tested, is responsible for the cancer diagnoses in the family, and it is, therefore, important to remain in touch with cancer genetics in the future so that we can continue to offer Dawn Diaz the most up to date genetic testing.   ADDITIONAL GENETIC TESTING: We discussed with Dawn Diaz that there are other genes that are associated with increased cancer risk that can be analyzed. The laboratories that offer such testing look at these additional genes via a hereditary cancer gene panel. Should Dawn Diaz wish to pursue such testing, we are happy to discuss and coordinate this testing at any time.    CANCER SCREENING RECOMMENDATIONS: This result is reassuring and suggests that Dawn Diaz's cancer was most likely  not due to an inherited predisposition associated with one of these genes.  Most cancers happen by chance and this negative test suggests that her cancer falls into this category.  We, therefore, recommended she continue to follow the cancer management and screening guidelines provided by her oncology and primary providers.   RECOMMENDATIONS FOR FAMILY MEMBERS:  Women in this family might be at some increased risk of developing cancer, over the general population risk, simply due to the family history of cancer.  We recommended women in this family have a yearly mammogram beginning at age 30, an an annual clinical breast exam, and perform monthly breast self-exams. Women in this family should also have a gynecological exam as recommended by their primary provider. All family members should have a colonoscopy by age 13.  FOLLOW-UP: Lastly, we discussed with Dawn Diaz that cancer genetics is a rapidly advancing field and it is possible that new genetic tests will be appropriate for her and/or her family members in the future. We encouraged her to remain in contact with cancer genetics on an annual basis so we can update her personal and family histories and let her know of advances in cancer genetics that may benefit this family.   Our contact number was provided. Dawn Diaz questions were answered to her satisfaction, and she knows she is welcome to call us at anytime with additional questions or concerns. This patient was discussed with Dr. Jana Hakim who agrees with the above.   Dawn A. Fine, MS, CGC Certified Genetic [email or web address]

## 2014-01-31 ENCOUNTER — Telehealth: Payer: Self-pay

## 2014-01-31 NOTE — Telephone Encounter (Signed)
Order faxed to [facility] for L8030 and L8000.  Sent to scan.

## 2014-02-06 DIAGNOSIS — H26499 Other secondary cataract, unspecified eye: Secondary | ICD-10-CM | POA: Diagnosis not present

## 2014-02-06 DIAGNOSIS — H2640 Unspecified secondary cataract: Secondary | ICD-10-CM | POA: Diagnosis not present

## 2014-02-12 DIAGNOSIS — R350 Frequency of micturition: Secondary | ICD-10-CM | POA: Diagnosis not present

## 2014-02-12 DIAGNOSIS — R351 Nocturia: Secondary | ICD-10-CM | POA: Diagnosis not present

## 2014-02-12 DIAGNOSIS — R3915 Urgency of urination: Secondary | ICD-10-CM | POA: Diagnosis not present

## 2014-03-21 DIAGNOSIS — Z23 Encounter for immunization: Secondary | ICD-10-CM | POA: Diagnosis not present

## 2014-03-27 DIAGNOSIS — H43813 Vitreous degeneration, bilateral: Secondary | ICD-10-CM | POA: Diagnosis not present

## 2014-03-27 DIAGNOSIS — H2511 Age-related nuclear cataract, right eye: Secondary | ICD-10-CM | POA: Diagnosis not present

## 2014-03-27 DIAGNOSIS — Z79899 Other long term (current) drug therapy: Secondary | ICD-10-CM | POA: Diagnosis not present

## 2014-03-28 DIAGNOSIS — R300 Dysuria: Secondary | ICD-10-CM | POA: Diagnosis not present

## 2014-03-28 DIAGNOSIS — N390 Urinary tract infection, site not specified: Secondary | ICD-10-CM | POA: Diagnosis not present

## 2014-04-02 ENCOUNTER — Other Ambulatory Visit: Payer: Self-pay | Admitting: Ophthalmology

## 2014-04-02 DIAGNOSIS — D485 Neoplasm of uncertain behavior of skin: Secondary | ICD-10-CM | POA: Diagnosis not present

## 2014-04-02 DIAGNOSIS — D2312 Other benign neoplasm of skin of left eyelid, including canthus: Secondary | ICD-10-CM | POA: Diagnosis not present

## 2014-04-02 DIAGNOSIS — L821 Other seborrheic keratosis: Secondary | ICD-10-CM | POA: Diagnosis not present

## 2014-04-03 DIAGNOSIS — Z471 Aftercare following joint replacement surgery: Secondary | ICD-10-CM | POA: Diagnosis not present

## 2014-04-03 DIAGNOSIS — Z96651 Presence of right artificial knee joint: Secondary | ICD-10-CM | POA: Diagnosis not present

## 2014-04-04 DIAGNOSIS — H5340 Unspecified visual field defects: Secondary | ICD-10-CM | POA: Diagnosis not present

## 2014-04-04 DIAGNOSIS — Z79899 Other long term (current) drug therapy: Secondary | ICD-10-CM | POA: Diagnosis not present

## 2014-04-04 DIAGNOSIS — H2511 Age-related nuclear cataract, right eye: Secondary | ICD-10-CM | POA: Diagnosis not present

## 2014-04-04 DIAGNOSIS — H4011X3 Primary open-angle glaucoma, severe stage: Secondary | ICD-10-CM | POA: Diagnosis not present

## 2014-04-11 DIAGNOSIS — Z79899 Other long term (current) drug therapy: Secondary | ICD-10-CM | POA: Diagnosis not present

## 2014-04-22 DIAGNOSIS — M542 Cervicalgia: Secondary | ICD-10-CM | POA: Diagnosis not present

## 2014-04-22 DIAGNOSIS — M19041 Primary osteoarthritis, right hand: Secondary | ICD-10-CM | POA: Diagnosis not present

## 2014-04-22 DIAGNOSIS — M0609 Rheumatoid arthritis without rheumatoid factor, multiple sites: Secondary | ICD-10-CM | POA: Diagnosis not present

## 2014-04-22 DIAGNOSIS — M8580 Other specified disorders of bone density and structure, unspecified site: Secondary | ICD-10-CM | POA: Diagnosis not present

## 2014-05-01 DIAGNOSIS — E559 Vitamin D deficiency, unspecified: Secondary | ICD-10-CM | POA: Diagnosis not present

## 2014-05-01 DIAGNOSIS — R5381 Other malaise: Secondary | ICD-10-CM | POA: Diagnosis not present

## 2014-05-05 DIAGNOSIS — H2511 Age-related nuclear cataract, right eye: Secondary | ICD-10-CM | POA: Diagnosis not present

## 2014-05-12 DIAGNOSIS — L570 Actinic keratosis: Secondary | ICD-10-CM | POA: Diagnosis not present

## 2014-05-12 DIAGNOSIS — L719 Rosacea, unspecified: Secondary | ICD-10-CM | POA: Diagnosis not present

## 2014-05-12 DIAGNOSIS — L821 Other seborrheic keratosis: Secondary | ICD-10-CM | POA: Diagnosis not present

## 2014-05-17 ENCOUNTER — Other Ambulatory Visit: Payer: Self-pay | Admitting: Oncology

## 2014-05-17 DIAGNOSIS — C50919 Malignant neoplasm of unspecified site of unspecified female breast: Secondary | ICD-10-CM

## 2014-05-27 ENCOUNTER — Other Ambulatory Visit: Payer: Self-pay

## 2014-05-27 DIAGNOSIS — Z1231 Encounter for screening mammogram for malignant neoplasm of breast: Secondary | ICD-10-CM

## 2014-05-27 DIAGNOSIS — Z9011 Acquired absence of right breast and nipple: Secondary | ICD-10-CM

## 2014-05-27 DIAGNOSIS — Z853 Personal history of malignant neoplasm of breast: Secondary | ICD-10-CM

## 2014-06-06 DIAGNOSIS — K589 Irritable bowel syndrome without diarrhea: Secondary | ICD-10-CM | POA: Diagnosis not present

## 2014-06-06 DIAGNOSIS — Z6821 Body mass index (BMI) 21.0-21.9, adult: Secondary | ICD-10-CM | POA: Diagnosis not present

## 2014-06-06 DIAGNOSIS — G4700 Insomnia, unspecified: Secondary | ICD-10-CM | POA: Diagnosis not present

## 2014-06-06 DIAGNOSIS — M0600 Rheumatoid arthritis without rheumatoid factor, unspecified site: Secondary | ICD-10-CM | POA: Diagnosis not present

## 2014-06-17 ENCOUNTER — Other Ambulatory Visit: Payer: Self-pay | Admitting: Dermatology

## 2014-06-17 DIAGNOSIS — L821 Other seborrheic keratosis: Secondary | ICD-10-CM | POA: Diagnosis not present

## 2014-06-17 DIAGNOSIS — Z85828 Personal history of other malignant neoplasm of skin: Secondary | ICD-10-CM | POA: Diagnosis not present

## 2014-06-17 DIAGNOSIS — D485 Neoplasm of uncertain behavior of skin: Secondary | ICD-10-CM | POA: Diagnosis not present

## 2014-06-17 DIAGNOSIS — L719 Rosacea, unspecified: Secondary | ICD-10-CM | POA: Diagnosis not present

## 2014-06-17 DIAGNOSIS — L570 Actinic keratosis: Secondary | ICD-10-CM | POA: Diagnosis not present

## 2014-06-17 DIAGNOSIS — L820 Inflamed seborrheic keratosis: Secondary | ICD-10-CM | POA: Diagnosis not present

## 2014-07-03 ENCOUNTER — Telehealth: Payer: Self-pay | Admitting: Oncology

## 2014-07-03 ENCOUNTER — Ambulatory Visit (HOSPITAL_BASED_OUTPATIENT_CLINIC_OR_DEPARTMENT_OTHER): Payer: Medicare Other | Admitting: Oncology

## 2014-07-03 ENCOUNTER — Ambulatory Visit
Admission: RE | Admit: 2014-07-03 | Discharge: 2014-07-03 | Disposition: A | Discharge status: Home / Self Care | Payer: Medicare Other | Source: Ambulatory Visit

## 2014-07-03 VITALS — BP 134/66 | HR 68 | Temp 97.7°F | Resp 18 | Ht 68.5 in | Wt 136.3 lb

## 2014-07-03 DIAGNOSIS — Z853 Personal history of malignant neoplasm of breast: Secondary | ICD-10-CM

## 2014-07-03 DIAGNOSIS — Z9011 Acquired absence of right breast and nipple: Secondary | ICD-10-CM

## 2014-07-03 DIAGNOSIS — C44501 Unspecified malignant neoplasm of skin of breast: Secondary | ICD-10-CM | POA: Diagnosis not present

## 2014-07-03 DIAGNOSIS — Z809 Family history of malignant neoplasm, unspecified: Secondary | ICD-10-CM

## 2014-07-03 DIAGNOSIS — Z1231 Encounter for screening mammogram for malignant neoplasm of breast: Secondary | ICD-10-CM | POA: Diagnosis not present

## 2014-07-03 NOTE — Telephone Encounter (Signed)
gv and printed appt sched and avs for pt for OCT. °

## 2014-07-03 NOTE — Progress Notes (Signed)
  [facility] OFFICE PROGRESS NOTE   Diagnosis: Breast cancer  INTERVAL HISTORY:   Dawn Diaz returns as scheduled. She continues tamoxifen. No bleeding or symptom of thrombosis. No change over the chest wall. She feels well. She is exercising.  Objective:  Vital signs in last 24 hours:  Blood pressure 134/66, pulse 68, temperature 97.7 F (36.5 C), temperature source Oral, resp. rate 18, height 5' 8.5" (1.74 m), weight 136 lb 4.8 oz (61.825 kg).    HEENT: Neck without mass Lymphatics: No cervical, supra-clavicular, or axillary nodes Resp: Lungs clear bilaterally Cardio: Regular rate and rhythm GI: No hepatomegaly Vascular: No leg edema Breast: Left breast without mass, status post right mastectomy. No evidence for chest wall tumor recurrence. Radiation telangectasias over the right chest wall       Imaging:  Mm Screening Breast Tomo Uni L  07/03/2014   CLINICAL DATA:  Screening.  EXAM: DIGITAL SCREENING UNILATERAL LEFT MAMMOGRAM WITH TOMO AND CAD  COMPARISON:  Previous exam(s).  ACR Breast Density Category c: The breast tissue is heterogeneously dense, which may obscure small masses.  FINDINGS: There are no findings suspicious for malignancy. Images were processed with CAD.  IMPRESSION: No mammographic evidence of malignancy. A result letter of this screening mammogram will be mailed directly to the patient.  RECOMMENDATION: Screening mammogram in one year. (Code:SM-B-01Y)  BI-RADS CATEGORY  1: Negative.   Electronically Signed   By: Dawn Diaz M.D.   On: 07/03/2014 13:23    Medications: I have reviewed the patient's current medications.  Assessment/Plan: 1. Extensive DCIS of the right breast diagnosed in 1997, status post a right mastectomy. 2. Chest wall recurrence with invasive breast cancer (ER positive, PR positive and HER-2/neu positive), status post removal of the right breast implant and a wide excision procedure in January 2009 with negative surgical  margins. She began Arimidex in April 2009. She completed adjuvant radiation to the right chest wall. She was unable to tolerate the Arimidex due to arthralgias. She was switched to tamoxifen in July 2009. 3. Tiny lung nodule on a CT of the chest in December 2008 - likely a benign finding. 4. History of multiple thyroid nodules - followed by Dawn Diaz. 5. Extensive family history of cancer. Negative testing for BRCA1 and BRCA2 in 2015 6. Arthralgias secondary to Arimidex and Aromasin - she continues followup with Dawn Diaz for "arthritis." She reports being diagnosed with rheumatoid arthritis. There has been clinical improvement with Plaquenil. 7. Status post right knee replacement October 2014    Disposition:  Dawn Diaz appears stable. No evidence for progression of breast cancer. She will continue tamoxifen. She will return for an office visit in 9 months.  Dawn Coder, MD  07/03/2014  3:46 PM

## 2014-07-25 DIAGNOSIS — H52203 Unspecified astigmatism, bilateral: Secondary | ICD-10-CM | POA: Diagnosis not present

## 2014-07-25 DIAGNOSIS — H2511 Age-related nuclear cataract, right eye: Secondary | ICD-10-CM | POA: Diagnosis not present

## 2014-07-25 DIAGNOSIS — H04123 Dry eye syndrome of bilateral lacrimal glands: Secondary | ICD-10-CM | POA: Diagnosis not present

## 2014-07-25 DIAGNOSIS — H4011X2 Primary open-angle glaucoma, moderate stage: Secondary | ICD-10-CM | POA: Diagnosis not present

## 2014-07-31 DIAGNOSIS — Z79899 Other long term (current) drug therapy: Secondary | ICD-10-CM | POA: Diagnosis not present

## 2014-07-31 DIAGNOSIS — M0609 Rheumatoid arthritis without rheumatoid factor, multiple sites: Secondary | ICD-10-CM | POA: Diagnosis not present

## 2014-07-31 DIAGNOSIS — R300 Dysuria: Secondary | ICD-10-CM | POA: Diagnosis not present

## 2014-07-31 DIAGNOSIS — L930 Discoid lupus erythematosus: Secondary | ICD-10-CM | POA: Diagnosis not present

## 2014-07-31 DIAGNOSIS — H3531 Nonexudative age-related macular degeneration: Secondary | ICD-10-CM | POA: Diagnosis not present

## 2014-08-15 ENCOUNTER — Other Ambulatory Visit: Payer: Self-pay | Admitting: Oncology

## 2014-09-02 DIAGNOSIS — H4010X0 Unspecified open-angle glaucoma, stage unspecified: Secondary | ICD-10-CM | POA: Diagnosis not present

## 2014-09-02 DIAGNOSIS — H25041 Posterior subcapsular polar age-related cataract, right eye: Secondary | ICD-10-CM | POA: Diagnosis not present

## 2014-09-02 DIAGNOSIS — H2511 Age-related nuclear cataract, right eye: Secondary | ICD-10-CM | POA: Diagnosis not present

## 2014-09-02 DIAGNOSIS — H25811 Combined forms of age-related cataract, right eye: Secondary | ICD-10-CM | POA: Diagnosis not present

## 2014-09-02 DIAGNOSIS — H25011 Cortical age-related cataract, right eye: Secondary | ICD-10-CM | POA: Diagnosis not present

## 2014-09-08 ENCOUNTER — Emergency Department (HOSPITAL_COMMUNITY): Payer: Medicare Other

## 2014-09-08 ENCOUNTER — Emergency Department (HOSPITAL_COMMUNITY)
Admission: EM | Admit: 2014-09-08 | Discharge: 2014-09-08 | Disposition: A | Discharge status: Home / Self Care | Payer: Medicare Other | Attending: Emergency Medicine | Admitting: Emergency Medicine

## 2014-09-08 ENCOUNTER — Encounter (HOSPITAL_COMMUNITY): Payer: Self-pay | Admitting: Emergency Medicine

## 2014-09-08 DIAGNOSIS — Z7952 Long term (current) use of systemic steroids: Secondary | ICD-10-CM | POA: Insufficient documentation

## 2014-09-08 DIAGNOSIS — Z87891 Personal history of nicotine dependence: Secondary | ICD-10-CM | POA: Insufficient documentation

## 2014-09-08 DIAGNOSIS — H409 Unspecified glaucoma: Secondary | ICD-10-CM | POA: Insufficient documentation

## 2014-09-08 DIAGNOSIS — Z8742 Personal history of other diseases of the female genital tract: Secondary | ICD-10-CM | POA: Diagnosis not present

## 2014-09-08 DIAGNOSIS — M542 Cervicalgia: Secondary | ICD-10-CM | POA: Diagnosis not present

## 2014-09-08 DIAGNOSIS — Z8739 Personal history of other diseases of the musculoskeletal system and connective tissue: Secondary | ICD-10-CM | POA: Insufficient documentation

## 2014-09-08 DIAGNOSIS — S199XXA Unspecified injury of neck, initial encounter: Secondary | ICD-10-CM | POA: Diagnosis not present

## 2014-09-08 DIAGNOSIS — Z7982 Long term (current) use of aspirin: Secondary | ICD-10-CM | POA: Diagnosis not present

## 2014-09-08 DIAGNOSIS — F419 Anxiety disorder, unspecified: Secondary | ICD-10-CM | POA: Diagnosis not present

## 2014-09-08 DIAGNOSIS — K219 Gastro-esophageal reflux disease without esophagitis: Secondary | ICD-10-CM | POA: Insufficient documentation

## 2014-09-08 DIAGNOSIS — R51 Headache: Secondary | ICD-10-CM | POA: Diagnosis not present

## 2014-09-08 DIAGNOSIS — F329 Major depressive disorder, single episode, unspecified: Secondary | ICD-10-CM | POA: Diagnosis not present

## 2014-09-08 DIAGNOSIS — I10 Essential (primary) hypertension: Secondary | ICD-10-CM | POA: Insufficient documentation

## 2014-09-08 DIAGNOSIS — S0990XA Unspecified injury of head, initial encounter: Secondary | ICD-10-CM | POA: Diagnosis not present

## 2014-09-08 DIAGNOSIS — Z853 Personal history of malignant neoplasm of breast: Secondary | ICD-10-CM | POA: Insufficient documentation

## 2014-09-08 DIAGNOSIS — Z79899 Other long term (current) drug therapy: Secondary | ICD-10-CM | POA: Diagnosis not present

## 2014-09-08 DIAGNOSIS — Z91040 Latex allergy status: Secondary | ICD-10-CM | POA: Insufficient documentation

## 2014-09-08 DIAGNOSIS — Z880 Allergy status to penicillin: Secondary | ICD-10-CM | POA: Diagnosis not present

## 2014-09-08 DIAGNOSIS — R519 Headache, unspecified: Secondary | ICD-10-CM

## 2014-09-08 LAB — BASIC METABOLIC PANEL
ANION GAP: 7 (ref 5–15)
BUN: 12 mg/dL (ref 6–23)
CO2: 26 mmol/L (ref 19–32)
CREATININE: 0.72 mg/dL (ref 0.50–1.10)
Calcium: 8.2 mg/dL — ABNORMAL LOW (ref 8.4–10.5)
Chloride: 102 mmol/L (ref 96–112)
GFR calc Af Amer: 88 mL/min — ABNORMAL LOW (ref >90)
GFR calc non Af Amer: 76 mL/min — ABNORMAL LOW (ref >90)
GLUCOSE: 107 mg/dL — AB (ref 70–99)
Potassium: 3.7 mmol/L (ref 3.5–5.1)
SODIUM: 135 mmol/L (ref 135–145)

## 2014-09-08 LAB — I-STAT TROPONIN, ED: Troponin i, poc: 0 ng/mL (ref 0.00–0.08)

## 2014-09-08 LAB — CBC WITH DIFFERENTIAL/PLATELET
BASOS PCT: 1 % (ref 0–1)
Basophils Absolute: 0 10*3/uL (ref 0.0–0.1)
Eosinophils Absolute: 0 10*3/uL (ref 0.0–0.7)
Eosinophils Relative: 1 % (ref 0–5)
HEMATOCRIT: 43.9 % (ref 36.0–46.0)
Hemoglobin: 14.6 g/dL (ref 12.0–15.0)
Lymphocytes Relative: 19 % (ref 12–46)
Lymphs Abs: 1.1 10*3/uL (ref 0.7–4.0)
MCH: 30 pg (ref 26.0–34.0)
MCHC: 33.3 g/dL (ref 30.0–36.0)
MCV: 90.1 fL (ref 78.0–100.0)
MONO ABS: 0.3 10*3/uL (ref 0.1–1.0)
Monocytes Relative: 5 % (ref 3–12)
NEUTROS ABS: 4 10*3/uL (ref 1.7–7.7)
Neutrophils Relative %: 74 % (ref 43–77)
PLATELETS: 183 10*3/uL (ref 150–400)
RBC: 4.87 MIL/uL (ref 3.87–5.11)
RDW: 12.8 % (ref 11.5–15.5)
WBC: 5.4 10*3/uL (ref 4.0–10.5)

## 2014-09-08 MED ORDER — SODIUM CHLORIDE 0.9 % IV BOLUS (SEPSIS)
500.0000 mL | Freq: Once | INTRAVENOUS | Status: AC
  Administered 2014-09-08: 500 mL via INTRAVENOUS

## 2014-09-08 MED ORDER — METOCLOPRAMIDE HCL 5 MG/ML IJ SOLN
10.0000 mg | Freq: Once | INTRAMUSCULAR | Status: AC
  Administered 2014-09-08: 10 mg via INTRAVENOUS
  Filled 2014-09-08: qty 2

## 2014-09-08 MED ORDER — DIPHENHYDRAMINE HCL 50 MG/ML IJ SOLN
12.5000 mg | Freq: Once | INTRAMUSCULAR | Status: AC
  Administered 2014-09-08: 12.5 mg via INTRAVENOUS
  Filled 2014-09-08: qty 1

## 2014-09-08 NOTE — ED Notes (Signed)
Per EMS: pt c/o right side headache and HTN. Pt had one episode of n/v and all this started last night. Pain upon palpation to right side of neck. Pt had fall ten days ago .

## 2014-09-08 NOTE — ED Provider Notes (Signed)
CSN: [id number]     Arrival date & time 09/08/14  1700 History   First MD Initiated Contact with Patient 09/08/14 1756     Chief Complaint  Patient presents with  . Headache     The history is provided by the patient. No language interpreter was used.   Dawn Diaz presents for headache.  She had a fall 10 days ago and sustained an injury to her left face. She did not receive evaluation in the emergency department or imaging following the injury.  Last night she developed a right-sided frontal headache and some right posterior neck soreness. Symptoms were gradual in onset and waxing and waning in nature. Symptoms are moderate. She denies any fevers, new numbness or weakness. She had 2 episodes of emesis.  Past Medical History  Diagnosis Date  . Irritable bowel syndrome   . Diverticulitis   . GERD (gastroesophageal reflux disease)   . Ovarian cyst   . Bunion   . Osteoporosis   . Anxiety disorder   . Depression   . Hiatal hernia   . Glaucoma   . Bacterial overgrowth syndrome   . Breast cancer, stage 1     '97/recurrent '05 right breast(s/p mastectomy)  . History of TIAs     - 15 yrs ago"brief memory loss"   Past Surgical History  Procedure Laterality Date  . Mastectomy  1997    Right  . Abdominal hysterectomy    . Tonsillectomy    . Breast biopsy      right  . Cataract extraction Left   . Bunionectomy Bilateral     both, repeat x1  . Total knee arthroplasty Right 04/15/2013    Procedure: RIGHT TOTAL KNEE ARTHROPLASTY;  Surgeon: Gearlean Alf, MD;  Location: [facility];  Service: Orthopedics;  Laterality: Right;   Family History  Problem Relation Age of Onset  . Colon cancer Sister 57    Died at 71  . Kidney cancer Father 71  . Pancreatic cancer Mother 40  . Pancreatic cancer Maternal Grandmother 95  . Cancer Paternal Grandfather     died from "abdominal ca" at young age   History  Substance Use Topics  . Smoking status: Former Smoker -- 3 years    Types: Cigarettes     Quit date: 04/11/1951  . Smokeless tobacco: Never Used  . Alcohol Use: No   OB History    No data available     Review of Systems  All other systems reviewed and are negative.     Allergies  Amoxicillin and Latex  Home Medications   Prior to Admission medications   Medication Sig Start Date End Date Taking? Authorizing Provider  acetaminophen (TYLENOL) 325 MG tablet Take 2 tablets (650 mg total) by mouth every 6 (six) hours as needed. 04/18/13  Yes Arlee Muslim, PA-C  aspirin 81 MG tablet Take 81 mg by mouth daily. 09/30/13  Yes Historical Provider, MD  Bromfenac Sodium 0.07 % SOLN Place 1 drop into the right eye daily.   Yes Historical Provider, MD  cholecalciferol (VITAMIN D) 400 UNITS TABS tablet Take 400 Units by mouth at bedtime.    Yes Historical Provider, MD  dorzolamide (TRUSOPT) 2 % ophthalmic solution Place 1 drop into the right eye 2 (two) times daily.   Yes Historical Provider, MD  escitalopram (LEXAPRO) 10 MG tablet Take 10 mg by mouth daily.  08/23/13  Yes Historical Provider, MD  hydroxychloroquine (PLAQUENIL) 200 MG tablet Take 200 mg by mouth  2 (two) times daily.  09/11/13  Yes Historical Provider, MD  latanoprost (XALATAN) 0.005 % ophthalmic solution Place 1 drop into both eyes at bedtime.  06/18/11  Yes Historical Provider, MD  moxifloxacin (VIGAMOX) 0.5 % ophthalmic solution Place 1 drop into the right eye 4 (four) times daily. For 14 Days.   Yes Historical Provider, MD  naproxen sodium (ANAPROX) 220 MG tablet Take 220 mg by mouth daily as needed (pain).   Yes Historical Provider, MD  prednisoLONE acetate (PRED FORTE) 1 % ophthalmic suspension Place 1 drop into the right eye 4 (four) times daily.   Yes Historical Provider, MD  tamoxifen (NOLVADEX) 10 MG tablet TAKE 2 TABLETS ONCE DAILY Patient taking differently: Take 2 tablets by mouth Once Daily 08/15/14  Yes Ladell Pier, MD  polyethylene glycol (MIRALAX / GLYCOLAX) packet Take 17 g by mouth daily as needed.  04/18/13   Arlee Muslim, PA-C   BP 173/67 mmHg  Pulse 66  Temp(Src) 98.4 F (36.9 C) (Oral)  Resp 16  SpO2 97% Physical Exam  Constitutional: She is oriented to person, place, and time. She appears well-developed and well-nourished.  HENT:  Head: Normocephalic.  Healing abrasion below the left eye  Eyes: Pupils are equal, round, and reactive to light.  Left subconjunctival hematoma  Cardiovascular: Normal rate and regular rhythm.   No murmur heard. Pulmonary/Chest: Effort normal and breath sounds normal. No respiratory distress.  Abdominal: Soft. There is no tenderness. There is no rebound and no guarding.  Musculoskeletal: She exhibits no edema or tenderness.  Neurological: She is alert and oriented to person, place, and time.  Skin: Skin is warm and dry.  Psychiatric: She has a normal mood and affect. Her behavior is normal.  Nursing note and vitals reviewed.   ED Course  Procedures (including critical care time) Labs Review Labs Reviewed  BASIC METABOLIC PANEL - Abnormal; Notable for the following:    Glucose, Bld 107 (*)    Calcium 8.2 (*)    GFR calc non Af Amer 76 (*)    GFR calc Af Amer 88 (*)    All other components within normal limits  CBC WITH DIFFERENTIAL/PLATELET  I-STAT TROPOININ, ED    Imaging Review Ct Head Wo Contrast  09/08/2014   CLINICAL DATA:  Posterior headache, neck pain status post fall 2 days ago. No loss of consciousness.  EXAM: CT HEAD WITHOUT CONTRAST  CT CERVICAL SPINE WITHOUT CONTRAST  TECHNIQUE: Multidetector CT imaging of the head and cervical spine was performed following the standard protocol without intravenous contrast. Multiplanar CT image reconstructions of the cervical spine were also generated.  COMPARISON:  None.  FINDINGS: CT HEAD FINDINGS  There is no evidence of mass effect, midline shift, or extra-axial fluid collections. There is no evidence of a space-occupying lesion or intracranial hemorrhage. There is no evidence of a  cortical-based area of acute infarction. There is generalized cerebral atrophy. There is periventricular white matter low attenuation likely secondary to microangiopathy.  The ventricles and sulci are appropriate for the patient's age. The basal cisterns are patent.  Visualized portions of the orbits are unremarkable. The visualized portions of the paranasal sinuses and mastoid air cells are unremarkable. Cerebrovascular atherosclerotic calcifications are noted.  The osseous structures are unremarkable.  CT CERVICAL SPINE FINDINGS  The alignment is anatomic. The vertebral body heights are maintained. There is no acute fracture. 4 mm anterolisthesis of C7 on T1 secondary to bilateral facet arthropathy. The prevertebral soft tissues are normal. The  intraspinal soft tissues are not fully imaged on this examination due to poor soft tissue contrast, but there is no gross soft tissue abnormality.  Degenerative disc disease throughout the cervical spine most severe at C4-5, C5-6 and C6-7 with severe disc space narrowing and discogenic endplate osteophytes. There are broad-based disc osteophyte complexes at C4-5, C5-6 and C6-7 with bilateral uncovertebral degenerative changes resulting in foraminal encroachment. There bilateral uncovertebral degenerative changes at T1-2 partially visualize with foraminal encroachment. Moderate right facet arthropathy at C2-3.  There is biapical scarring.  There is a 9 mm right thyroid hypodense nodule. There is a 10 mm right inferior thyroid nodule. These are both unchanged compared with 06/10/2008.  IMPRESSION: 1. No acute intracranial pathology. 2. No acute osseous injury of the cervical spine. 3. Cervical spine spondylosis as described above.   Electronically Signed   By: Kathreen Devoid   On: 09/08/2014 19:48   Ct Cervical Spine Wo Contrast  09/08/2014   CLINICAL DATA:  Posterior headache, neck pain status post fall 2 days ago. No loss of consciousness.  EXAM: CT HEAD WITHOUT CONTRAST   CT CERVICAL SPINE WITHOUT CONTRAST  TECHNIQUE: Multidetector CT imaging of the head and cervical spine was performed following the standard protocol without intravenous contrast. Multiplanar CT image reconstructions of the cervical spine were also generated.  COMPARISON:  None.  FINDINGS: CT HEAD FINDINGS  There is no evidence of mass effect, midline shift, or extra-axial fluid collections. There is no evidence of a space-occupying lesion or intracranial hemorrhage. There is no evidence of a cortical-based area of acute infarction. There is generalized cerebral atrophy. There is periventricular white matter low attenuation likely secondary to microangiopathy.  The ventricles and sulci are appropriate for the patient's age. The basal cisterns are patent.  Visualized portions of the orbits are unremarkable. The visualized portions of the paranasal sinuses and mastoid air cells are unremarkable. Cerebrovascular atherosclerotic calcifications are noted.  The osseous structures are unremarkable.  CT CERVICAL SPINE FINDINGS  The alignment is anatomic. The vertebral body heights are maintained. There is no acute fracture. 4 mm anterolisthesis of C7 on T1 secondary to bilateral facet arthropathy. The prevertebral soft tissues are normal. The intraspinal soft tissues are not fully imaged on this examination due to poor soft tissue contrast, but there is no gross soft tissue abnormality.  Degenerative disc disease throughout the cervical spine most severe at C4-5, C5-6 and C6-7 with severe disc space narrowing and discogenic endplate osteophytes. There are broad-based disc osteophyte complexes at C4-5, C5-6 and C6-7 with bilateral uncovertebral degenerative changes resulting in foraminal encroachment. There bilateral uncovertebral degenerative changes at T1-2 partially visualize with foraminal encroachment. Moderate right facet arthropathy at C2-3.  There is biapical scarring.  There is a 9 mm right thyroid hypodense nodule.  There is a 10 mm right inferior thyroid nodule. These are both unchanged compared with 06/10/2008.  IMPRESSION: 1. No acute intracranial pathology. 2. No acute osseous injury of the cervical spine. 3. Cervical spine spondylosis as described above.   Electronically Signed   By: Kathreen Devoid   On: 09/08/2014 19:48     EKG Interpretation None      MDM   Final diagnoses:  Acute nonintractable headache, unspecified headache type    Patient here for evaluation of headache, some right-sided headache and right-sided posterior neck pain with recent fall. Headache improved on recheck following headache cocktail. Presentation are consistent with subarachnoid hemorrhage, meningitis, hypertensive urgency, dissection. CT scan with thyroid nodule, patient is  aware of this. Discussed patient PCP follow-up for recheck of her blood pressure and follow-up regarding her headache and thyroid nodule.    Quintella Reichert, MD 09/09/14 [phone]

## 2014-09-08 NOTE — Discharge Instructions (Signed)
Your blood pressure was elevated today, please get this rechecked by your family doctor.     Headaches, Frequently Asked Questions MIGRAINE HEADACHES Q: What is migraine? What causes it? How can I treat it? A: Generally, migraine headaches begin as a dull ache. Then they develop into a constant, throbbing, and pulsating pain. You may experience pain at the temples. You may experience pain at the front or back of one or both sides of the head. The pain is usually accompanied by a combination of:  Nausea.  Vomiting.  Sensitivity to light and noise. Some people (about 15%) experience an aura (see below) before an attack. The cause of migraine is believed to be chemical reactions in the brain. Treatment for migraine may include over-the-counter or prescription medications. It may also include self-help techniques. These include relaxation training and biofeedback.  Q: What is an aura? A: About 15% of people with migraine get an "aura". This is a sign of neurological symptoms that occur before a migraine headache. You may see wavy or jagged lines, dots, or flashing lights. You might experience tunnel vision or blind spots in one or both eyes. The aura can include visual or auditory hallucinations (something imagined). It may include disruptions in smell (such as strange odors), taste or touch. Other symptoms include:  Numbness.  A "pins and needles" sensation.  Difficulty in recalling or speaking the correct word. These neurological events may last as long as 60 minutes. These symptoms will fade as the headache begins. Q: What is a trigger? A: Certain physical or environmental factors can lead to or "trigger" a migraine. These include:  Foods.  Hormonal changes.  Weather.  Stress. It is important to remember that triggers are different for everyone. To help prevent migraine attacks, you need to figure out which triggers affect you. Keep a headache diary. This is a good way to track  triggers. The diary will help you talk to your healthcare professional about your condition. Q: Does weather affect migraines? A: Bright sunshine, hot, humid conditions, and drastic changes in barometric pressure may lead to, or "trigger," a migraine attack in some people. But studies have shown that weather does not act as a trigger for everyone with migraines. Q: What is the link between migraine and hormones? A: Hormones start and regulate many of your body's functions. Hormones keep your body in balance within a constantly changing environment. The levels of hormones in your body are unbalanced at times. Examples are during menstruation, pregnancy, or menopause. That can lead to a migraine attack. In fact, about three quarters of all women with migraine report that their attacks are related to the menstrual cycle.  Q: Is there an increased risk of stroke for migraine sufferers? A: The likelihood of a migraine attack causing a stroke is very remote. That is not to say that migraine sufferers cannot have a stroke associated with their migraines. In persons under age 92, the most common associated factor for stroke is migraine headache. But over the course of a person's normal life span, the occurrence of migraine headache may actually be associated with a reduced risk of dying from cerebrovascular disease due to stroke.  Q: What are acute medications for migraine? A: Acute medications are used to treat the pain of the headache after it has started. Examples over-the-counter medications, NSAIDs, ergots, and triptans.  Q: What are the triptans? A: Triptans are the newest class of abortive medications. They are specifically targeted to treat migraine. Triptans are vasoconstrictors.  They moderate some chemical reactions in the brain. The triptans work on receptors in your brain. Triptans help to restore the balance of a neurotransmitter called serotonin. Fluctuations in levels of serotonin are thought to be  a main cause of migraine.  Q: Are over-the-counter medications for migraine effective? A: Over-the-counter, or "OTC," medications may be effective in relieving mild to moderate pain and associated symptoms of migraine. But you should see your caregiver before beginning any treatment regimen for migraine.  Q: What are preventive medications for migraine? A: Preventive medications for migraine are sometimes referred to as "prophylactic" treatments. They are used to reduce the frequency, severity, and length of migraine attacks. Examples of preventive medications include antiepileptic medications, antidepressants, beta-blockers, calcium channel blockers, and NSAIDs (nonsteroidal anti-inflammatory drugs). Q: Why are anticonvulsants used to treat migraine? A: During the past few years, there has been an increased interest in antiepileptic drugs for the prevention of migraine. They are sometimes referred to as "anticonvulsants". Both epilepsy and migraine may be caused by similar reactions in the brain.  Q: Why are antidepressants used to treat migraine? A: Antidepressants are typically used to treat people with depression. They may reduce migraine frequency by regulating chemical levels, such as serotonin, in the brain.  Q: What alternative therapies are used to treat migraine? A: The term "alternative therapies" is often used to describe treatments considered outside the scope of conventional Western medicine. Examples of alternative therapy include acupuncture, acupressure, and yoga. Another common alternative treatment is herbal therapy. Some herbs are believed to relieve headache pain. Always discuss alternative therapies with your caregiver before proceeding. Some herbal products contain arsenic and other toxins. TENSION HEADACHES Q: What is a tension-type headache? What causes it? How can I treat it? A: Tension-type headaches occur randomly. They are often the result of temporary stress, anxiety,  fatigue, or anger. Symptoms include soreness in your temples, a tightening band-like sensation around your head (a "vice-like" ache). Symptoms can also include a pulling feeling, pressure sensations, and contracting head and neck muscles. The headache begins in your forehead, temples, or the back of your head and neck. Treatment for tension-type headache may include over-the-counter or prescription medications. Treatment may also include self-help techniques such as relaxation training and biofeedback. CLUSTER HEADACHES Q: What is a cluster headache? What causes it? How can I treat it? A: Cluster headache gets its name because the attacks come in groups. The pain arrives with little, if any, warning. It is usually on one side of the head. A tearing or bloodshot eye and a runny nose on the same side of the headache may also accompany the pain. Cluster headaches are believed to be caused by chemical reactions in the brain. They have been described as the most severe and intense of any headache type. Treatment for cluster headache includes prescription medication and oxygen. SINUS HEADACHES Q: What is a sinus headache? What causes it? How can I treat it? A: When a cavity in the bones of the face and skull (a sinus) becomes inflamed, the inflammation will cause localized pain. This condition is usually the result of an allergic reaction, a tumor, or an infection. If your headache is caused by a sinus blockage, such as an infection, you will probably have a fever. An x-ray will confirm a sinus blockage. Your caregiver's treatment might include antibiotics for the infection, as well as antihistamines or decongestants.  REBOUND HEADACHES Q: What is a rebound headache? What causes it? How can I treat it? A:  A pattern of taking acute headache medications too often can lead to a condition known as "rebound headache." A pattern of taking too much headache medication includes taking it more than 2 days per week or in  excessive amounts. That means more than the label or a caregiver advises. With rebound headaches, your medications not only stop relieving pain, they actually begin to cause headaches. Doctors treat rebound headache by tapering the medication that is being overused. Sometimes your caregiver will gradually substitute a different type of treatment or medication. Stopping may be a challenge. Regularly overusing a medication increases the potential for serious side effects. Consult a caregiver if you regularly use headache medications more than 2 days per week or more than the label advises. ADDITIONAL QUESTIONS AND ANSWERS Q: What is biofeedback? A: Biofeedback is a self-help treatment. Biofeedback uses special equipment to monitor your body's involuntary physical responses. Biofeedback monitors:  Breathing.  Pulse.  Heart rate.  Temperature.  Muscle tension.  Brain activity. Biofeedback helps you refine and perfect your relaxation exercises. You learn to control the physical responses that are related to stress. Once the technique has been mastered, you do not need the equipment any more. Q: Are headaches hereditary? A: Four out of five (80%) of people that suffer report a family history of migraine. Scientists are not sure if this is genetic or a family predisposition. Despite the uncertainty, a child has a 50% chance of having migraine if one parent suffers. The child has a 75% chance if both parents suffer.  Q: Can children get headaches? A: By the time they reach high school, most young people have experienced some type of headache. Many safe and effective approaches or medications can prevent a headache from occurring or stop it after it has begun.  Q: What type of doctor should I see to diagnose and treat my headache? A: Start with your primary caregiver. Discuss his or her experience and approach to headaches. Discuss methods of classification, diagnosis, and treatment. Your caregiver may  decide to recommend you to a headache specialist, depending upon your symptoms or other physical conditions. Having diabetes, allergies, etc., may require a more comprehensive and inclusive approach to your headache. The National [employer] will provide, upon request, a list of [facility] physician members in your state. Document Released: 08/27/2003 Document Revised: 08/29/2011 Document Reviewed: 02/04/2008 [facility] Patient Information 2015 [place], [place]. This information is not intended to replace advice given to you by your health care provider. Make sure you discuss any questions you have with your health care provider.

## 2014-09-11 DIAGNOSIS — Z79899 Other long term (current) drug therapy: Secondary | ICD-10-CM | POA: Diagnosis not present

## 2014-09-16 DIAGNOSIS — M1990 Unspecified osteoarthritis, unspecified site: Secondary | ICD-10-CM | POA: Diagnosis not present

## 2014-09-16 DIAGNOSIS — I10 Essential (primary) hypertension: Secondary | ICD-10-CM | POA: Diagnosis not present

## 2014-09-16 DIAGNOSIS — Z6821 Body mass index (BMI) 21.0-21.9, adult: Secondary | ICD-10-CM | POA: Diagnosis not present

## 2014-09-22 DIAGNOSIS — M069 Rheumatoid arthritis, unspecified: Secondary | ICD-10-CM | POA: Diagnosis not present

## 2014-09-22 DIAGNOSIS — M5030 Other cervical disc degeneration, unspecified cervical region: Secondary | ICD-10-CM | POA: Diagnosis not present

## 2014-09-22 DIAGNOSIS — M8580 Other specified disorders of bone density and structure, unspecified site: Secondary | ICD-10-CM | POA: Diagnosis not present

## 2014-09-22 DIAGNOSIS — R269 Unspecified abnormalities of gait and mobility: Secondary | ICD-10-CM | POA: Diagnosis not present

## 2014-11-20 DIAGNOSIS — L821 Other seborrheic keratosis: Secondary | ICD-10-CM | POA: Diagnosis not present

## 2014-11-20 DIAGNOSIS — L570 Actinic keratosis: Secondary | ICD-10-CM | POA: Diagnosis not present

## 2014-11-20 DIAGNOSIS — L719 Rosacea, unspecified: Secondary | ICD-10-CM | POA: Diagnosis not present

## 2014-11-20 DIAGNOSIS — Z411 Encounter for cosmetic surgery: Secondary | ICD-10-CM | POA: Diagnosis not present

## 2014-12-08 DIAGNOSIS — M859 Disorder of bone density and structure, unspecified: Secondary | ICD-10-CM | POA: Diagnosis not present

## 2014-12-08 DIAGNOSIS — I10 Essential (primary) hypertension: Secondary | ICD-10-CM | POA: Diagnosis not present

## 2014-12-08 DIAGNOSIS — E041 Nontoxic single thyroid nodule: Secondary | ICD-10-CM | POA: Diagnosis not present

## 2014-12-15 DIAGNOSIS — R32 Unspecified urinary incontinence: Secondary | ICD-10-CM | POA: Diagnosis not present

## 2014-12-15 DIAGNOSIS — E041 Nontoxic single thyroid nodule: Secondary | ICD-10-CM | POA: Diagnosis not present

## 2014-12-15 DIAGNOSIS — R1312 Dysphagia, oropharyngeal phase: Secondary | ICD-10-CM | POA: Diagnosis not present

## 2014-12-15 DIAGNOSIS — Z0000 Encounter for general adult medical examination without abnormal findings: Secondary | ICD-10-CM | POA: Diagnosis not present

## 2014-12-15 DIAGNOSIS — I10 Essential (primary) hypertension: Secondary | ICD-10-CM | POA: Diagnosis not present

## 2014-12-15 DIAGNOSIS — Z6821 Body mass index (BMI) 21.0-21.9, adult: Secondary | ICD-10-CM | POA: Diagnosis not present

## 2014-12-15 DIAGNOSIS — E063 Autoimmune thyroiditis: Secondary | ICD-10-CM | POA: Diagnosis not present

## 2014-12-15 DIAGNOSIS — K529 Noninfective gastroenteritis and colitis, unspecified: Secondary | ICD-10-CM | POA: Diagnosis not present

## 2014-12-15 DIAGNOSIS — K117 Disturbances of salivary secretion: Secondary | ICD-10-CM | POA: Diagnosis not present

## 2014-12-15 DIAGNOSIS — Z1212 Encounter for screening for malignant neoplasm of rectum: Secondary | ICD-10-CM | POA: Diagnosis not present

## 2014-12-15 DIAGNOSIS — M0600 Rheumatoid arthritis without rheumatoid factor, unspecified site: Secondary | ICD-10-CM | POA: Diagnosis not present

## 2014-12-15 DIAGNOSIS — J479 Bronchiectasis, uncomplicated: Secondary | ICD-10-CM | POA: Diagnosis not present

## 2014-12-15 DIAGNOSIS — E559 Vitamin D deficiency, unspecified: Secondary | ICD-10-CM | POA: Diagnosis not present

## 2015-01-02 DIAGNOSIS — R3989 Other symptoms and signs involving the genitourinary system: Secondary | ICD-10-CM | POA: Diagnosis not present

## 2015-01-16 DIAGNOSIS — Z961 Presence of intraocular lens: Secondary | ICD-10-CM | POA: Diagnosis not present

## 2015-01-16 DIAGNOSIS — H4011X3 Primary open-angle glaucoma, severe stage: Secondary | ICD-10-CM | POA: Diagnosis not present

## 2015-01-16 DIAGNOSIS — H4011X2 Primary open-angle glaucoma, moderate stage: Secondary | ICD-10-CM | POA: Diagnosis not present

## 2015-01-21 DIAGNOSIS — H4011X2 Primary open-angle glaucoma, moderate stage: Secondary | ICD-10-CM | POA: Diagnosis not present

## 2015-01-27 DIAGNOSIS — Z79899 Other long term (current) drug therapy: Secondary | ICD-10-CM | POA: Diagnosis not present

## 2015-01-27 DIAGNOSIS — H3531 Nonexudative age-related macular degeneration: Secondary | ICD-10-CM | POA: Diagnosis not present

## 2015-02-17 DIAGNOSIS — Z79899 Other long term (current) drug therapy: Secondary | ICD-10-CM | POA: Diagnosis not present

## 2015-02-24 DIAGNOSIS — M0579 Rheumatoid arthritis with rheumatoid factor of multiple sites without organ or systems involvement: Secondary | ICD-10-CM | POA: Diagnosis not present

## 2015-02-24 DIAGNOSIS — M8580 Other specified disorders of bone density and structure, unspecified site: Secondary | ICD-10-CM | POA: Diagnosis not present

## 2015-02-24 DIAGNOSIS — M19041 Primary osteoarthritis, right hand: Secondary | ICD-10-CM | POA: Diagnosis not present

## 2015-02-24 DIAGNOSIS — Z09 Encounter for follow-up examination after completed treatment for conditions other than malignant neoplasm: Secondary | ICD-10-CM | POA: Diagnosis not present

## 2015-03-02 DIAGNOSIS — L821 Other seborrheic keratosis: Secondary | ICD-10-CM | POA: Diagnosis not present

## 2015-03-02 DIAGNOSIS — L570 Actinic keratosis: Secondary | ICD-10-CM | POA: Diagnosis not present

## 2015-03-27 ENCOUNTER — Telehealth: Payer: Self-pay | Admitting: Oncology

## 2015-03-27 NOTE — Telephone Encounter (Signed)
Returned call to patient to reschedule f/u due to spouse died. Gave patient new appointment for 10/31 @ 12:30 pm.

## 2015-03-31 ENCOUNTER — Ambulatory Visit: Payer: Medicare Other | Admitting: Oncology

## 2015-04-09 DIAGNOSIS — Z23 Encounter for immunization: Secondary | ICD-10-CM | POA: Diagnosis not present

## 2015-04-20 ENCOUNTER — Ambulatory Visit (HOSPITAL_BASED_OUTPATIENT_CLINIC_OR_DEPARTMENT_OTHER): Payer: Medicare Other | Admitting: Oncology

## 2015-04-20 ENCOUNTER — Telehealth: Payer: Self-pay | Admitting: Oncology

## 2015-04-20 VITALS — BP 144/61 | HR 66 | Temp 98.4°F | Resp 17 | Ht 68.5 in | Wt 134.5 lb

## 2015-04-20 DIAGNOSIS — Z809 Family history of malignant neoplasm, unspecified: Secondary | ICD-10-CM | POA: Diagnosis not present

## 2015-04-20 DIAGNOSIS — Z853 Personal history of malignant neoplasm of breast: Secondary | ICD-10-CM | POA: Diagnosis not present

## 2015-04-20 NOTE — Telephone Encounter (Signed)
per pof to sch pt appt-gave pt copy of avs °

## 2015-04-20 NOTE — Progress Notes (Signed)
  [facility] OFFICE PROGRESS NOTE   Diagnosis: Breast cancer  INTERVAL HISTORY:   Mr. Morfin returns as scheduled. She continues tamoxifen. No hot flashes. No bleeding or symptom of thrombosis. No change over the chest wall. Her husband of 16 years died of cancer earlier this month.  Objective:  Vital signs in last 24 hours:  Blood pressure 144/61, pulse 66, temperature 98.4 F (36.9 C), temperature source Oral, resp. rate 17, height 5' 8.5" (1.74 m), weight 134 lb 8 oz (61.009 kg), SpO2 98 %.    HEENT: Neck without mass Lymphatics: No cervical, supra-clavicular, or axillary nodes Resp: Lungs clear bilaterally Cardio: Regular rate and rhythm GI: No hepatomegaly Vascular: No leg edema Breasts: Status post right mastectomy. Radiation telangiectasias over the right chest wall. No chest wall nodules. Left breast without mass.   Portacath/PICC-without erythema   Medications: I have reviewed the patient's current medications.  Assessment/Plan: 1. Extensive DCIS of the right breast diagnosed in 1997, status post a right mastectomy. 2. Chest wall recurrence with invasive breast cancer (ER positive, PR positive and HER-2/neu positive), status post removal of the right breast implant and a wide excision procedure in January 2009 with negative surgical margins. She began Arimidex in April 2009. She completed adjuvant radiation to the right chest wall. She was unable to tolerate the Arimidex due to arthralgias. She was switched to tamoxifen in July 2009. 3. Tiny lung nodule on a CT of the chest in December 2008 - likely a benign finding. 4. History of multiple thyroid nodules - followed by Dr Harlow Asa. 5. Extensive family history of cancer. Negative testing for BRCA1 and BRCA2 in 2015 6. Arthralgias secondary to Arimidex and Aromasin - she continues followup with Dr Estanislado Pandy for "arthritis." She reports being diagnosed with rheumatoid arthritis. There has been clinical  improvement with Plaquenil. 7. Status post right knee replacement October 2014   Disposition:  Mr. Nodal remains in clinical remission from breast cancer. She will continue tamoxifen. She will schedule a mammogram for January 2017. She will return for an office visit in one year. She will contact us in the interim for new symptoms.  Betsy Coder, MD  04/20/2015  1:21 PM

## 2015-04-30 ENCOUNTER — Other Ambulatory Visit: Payer: Self-pay | Admitting: Oncology

## 2015-06-01 ENCOUNTER — Other Ambulatory Visit: Payer: Self-pay

## 2015-06-01 DIAGNOSIS — Z1231 Encounter for screening mammogram for malignant neoplasm of breast: Secondary | ICD-10-CM

## 2015-06-08 DIAGNOSIS — Z6821 Body mass index (BMI) 21.0-21.9, adult: Secondary | ICD-10-CM | POA: Diagnosis not present

## 2015-06-08 DIAGNOSIS — J40 Bronchitis, not specified as acute or chronic: Secondary | ICD-10-CM | POA: Diagnosis not present

## 2015-06-08 DIAGNOSIS — J479 Bronchiectasis, uncomplicated: Secondary | ICD-10-CM | POA: Diagnosis not present

## 2015-06-08 DIAGNOSIS — R05 Cough: Secondary | ICD-10-CM | POA: Diagnosis not present

## 2015-06-25 DIAGNOSIS — R3989 Other symptoms and signs involving the genitourinary system: Secondary | ICD-10-CM | POA: Diagnosis not present

## 2015-06-25 DIAGNOSIS — B373 Candidiasis of vulva and vagina: Secondary | ICD-10-CM | POA: Diagnosis not present

## 2015-07-07 ENCOUNTER — Ambulatory Visit
Admission: RE | Admit: 2015-07-07 | Discharge: 2015-07-07 | Disposition: A | Discharge status: Home / Self Care | Payer: Medicare Other | Source: Ambulatory Visit

## 2015-07-07 DIAGNOSIS — Z1231 Encounter for screening mammogram for malignant neoplasm of breast: Secondary | ICD-10-CM | POA: Diagnosis not present

## 2015-07-08 ENCOUNTER — Other Ambulatory Visit: Payer: Self-pay | Admitting: Oncology

## 2015-07-08 DIAGNOSIS — R928 Other abnormal and inconclusive findings on diagnostic imaging of breast: Secondary | ICD-10-CM

## 2015-07-13 ENCOUNTER — Other Ambulatory Visit: Payer: Medicare Other

## 2015-07-13 DIAGNOSIS — Z23 Encounter for immunization: Secondary | ICD-10-CM | POA: Diagnosis not present

## 2015-07-13 DIAGNOSIS — D225 Melanocytic nevi of trunk: Secondary | ICD-10-CM | POA: Diagnosis not present

## 2015-07-13 DIAGNOSIS — L821 Other seborrheic keratosis: Secondary | ICD-10-CM | POA: Diagnosis not present

## 2015-07-13 DIAGNOSIS — Z411 Encounter for cosmetic surgery: Secondary | ICD-10-CM | POA: Diagnosis not present

## 2015-07-13 DIAGNOSIS — Z85828 Personal history of other malignant neoplasm of skin: Secondary | ICD-10-CM | POA: Diagnosis not present

## 2015-07-13 DIAGNOSIS — L719 Rosacea, unspecified: Secondary | ICD-10-CM | POA: Diagnosis not present

## 2015-07-13 DIAGNOSIS — L570 Actinic keratosis: Secondary | ICD-10-CM | POA: Diagnosis not present

## 2015-07-14 ENCOUNTER — Ambulatory Visit
Admission: RE | Admit: 2015-07-14 | Discharge: 2015-07-14 | Disposition: A | Discharge status: Home / Self Care | Payer: Medicare Other | Source: Ambulatory Visit | Attending: Oncology | Admitting: Oncology

## 2015-07-14 DIAGNOSIS — R922 Inconclusive mammogram: Secondary | ICD-10-CM | POA: Diagnosis not present

## 2015-07-14 DIAGNOSIS — R928 Other abnormal and inconclusive findings on diagnostic imaging of breast: Secondary | ICD-10-CM

## 2015-07-14 DIAGNOSIS — N6489 Other specified disorders of breast: Secondary | ICD-10-CM | POA: Diagnosis not present

## 2015-07-17 DIAGNOSIS — H401122 Primary open-angle glaucoma, left eye, moderate stage: Secondary | ICD-10-CM | POA: Diagnosis not present

## 2015-07-31 ENCOUNTER — Ambulatory Visit (INDEPENDENT_AMBULATORY_CARE_PROVIDER_SITE_OTHER): Payer: Medicare Other | Admitting: Podiatry

## 2015-07-31 ENCOUNTER — Encounter: Payer: Self-pay | Admitting: Podiatry

## 2015-07-31 DIAGNOSIS — M2041 Other hammer toe(s) (acquired), right foot: Secondary | ICD-10-CM

## 2015-07-31 DIAGNOSIS — L84 Corns and callosities: Secondary | ICD-10-CM | POA: Diagnosis not present

## 2015-08-03 NOTE — Progress Notes (Signed)
Subjective:     Patient ID: Dawn Diaz, female   DOB: [birth year], 82 y.o.   MRN: [id number]  HPI patient presents with painful second digit right foot with significant elevation of the toe and rigid contracture with painful keratotic lesion noted   Review of Systems     Objective:   Physical Exam Neurovascular status found to be intact muscle strength was found to be diminished but within normal limits and patient's found to have elevated second digit right with rigid contracture and keratotic lesion that's painful when pressed    Assessment:     Hammertoe deformity second right with rigid contracture and pain with lesion formation    Plan:     H&P and education concerning hammertoe and consideration of correction resolved and reviewed with patient. Debridement of lesion with padding accomplished today and reappoint for this treatment as needed

## 2015-08-04 DIAGNOSIS — Z0000 Encounter for general adult medical examination without abnormal findings: Secondary | ICD-10-CM | POA: Diagnosis not present

## 2015-08-04 DIAGNOSIS — R3989 Other symptoms and signs involving the genitourinary system: Secondary | ICD-10-CM | POA: Diagnosis not present

## 2015-08-11 DIAGNOSIS — Z79899 Other long term (current) drug therapy: Secondary | ICD-10-CM | POA: Diagnosis not present

## 2015-08-12 DIAGNOSIS — H401112 Primary open-angle glaucoma, right eye, moderate stage: Secondary | ICD-10-CM | POA: Diagnosis not present

## 2015-08-18 ENCOUNTER — Other Ambulatory Visit: Payer: Self-pay | Admitting: Oncology

## 2015-08-18 DIAGNOSIS — N6489 Other specified disorders of breast: Secondary | ICD-10-CM

## 2015-08-21 DIAGNOSIS — M0579 Rheumatoid arthritis with rheumatoid factor of multiple sites without organ or systems involvement: Secondary | ICD-10-CM | POA: Diagnosis not present

## 2015-08-21 DIAGNOSIS — Z09 Encounter for follow-up examination after completed treatment for conditions other than malignant neoplasm: Secondary | ICD-10-CM | POA: Diagnosis not present

## 2015-08-21 DIAGNOSIS — M25562 Pain in left knee: Secondary | ICD-10-CM | POA: Diagnosis not present

## 2015-08-21 DIAGNOSIS — M810 Age-related osteoporosis without current pathological fracture: Secondary | ICD-10-CM | POA: Diagnosis not present

## 2015-09-07 DIAGNOSIS — F4322 Adjustment disorder with anxiety: Secondary | ICD-10-CM | POA: Diagnosis not present

## 2015-09-09 DIAGNOSIS — L820 Inflamed seborrheic keratosis: Secondary | ICD-10-CM | POA: Diagnosis not present

## 2015-09-16 DIAGNOSIS — M2480 Other specific joint derangements of unspecified joint, not elsewhere classified: Secondary | ICD-10-CM | POA: Diagnosis not present

## 2015-09-16 DIAGNOSIS — M47812 Spondylosis without myelopathy or radiculopathy, cervical region: Secondary | ICD-10-CM | POA: Diagnosis not present

## 2015-09-16 DIAGNOSIS — M436 Torticollis: Secondary | ICD-10-CM | POA: Diagnosis not present

## 2015-09-16 DIAGNOSIS — M542 Cervicalgia: Secondary | ICD-10-CM | POA: Diagnosis not present

## 2015-09-24 DIAGNOSIS — Z961 Presence of intraocular lens: Secondary | ICD-10-CM | POA: Diagnosis not present

## 2015-09-24 DIAGNOSIS — H401133 Primary open-angle glaucoma, bilateral, severe stage: Secondary | ICD-10-CM | POA: Diagnosis not present

## 2015-09-24 DIAGNOSIS — H26491 Other secondary cataract, right eye: Secondary | ICD-10-CM | POA: Diagnosis not present

## 2015-09-30 DIAGNOSIS — M47812 Spondylosis without myelopathy or radiculopathy, cervical region: Secondary | ICD-10-CM | POA: Diagnosis not present

## 2015-10-07 DIAGNOSIS — M47812 Spondylosis without myelopathy or radiculopathy, cervical region: Secondary | ICD-10-CM | POA: Diagnosis not present

## 2015-10-14 DIAGNOSIS — M47812 Spondylosis without myelopathy or radiculopathy, cervical region: Secondary | ICD-10-CM | POA: Diagnosis not present

## 2015-10-15 DIAGNOSIS — M2480 Other specific joint derangements of unspecified joint, not elsewhere classified: Secondary | ICD-10-CM | POA: Diagnosis not present

## 2015-10-15 DIAGNOSIS — M542 Cervicalgia: Secondary | ICD-10-CM | POA: Diagnosis not present

## 2015-10-15 DIAGNOSIS — M436 Torticollis: Secondary | ICD-10-CM | POA: Diagnosis not present

## 2015-10-15 DIAGNOSIS — M47812 Spondylosis without myelopathy or radiculopathy, cervical region: Secondary | ICD-10-CM | POA: Diagnosis not present

## 2015-10-25 ENCOUNTER — Other Ambulatory Visit: Payer: Self-pay | Admitting: Oncology

## 2015-11-06 DIAGNOSIS — H0014 Chalazion left upper eyelid: Secondary | ICD-10-CM | POA: Diagnosis not present

## 2015-12-17 DIAGNOSIS — R8290 Unspecified abnormal findings in urine: Secondary | ICD-10-CM | POA: Diagnosis not present

## 2015-12-17 DIAGNOSIS — N390 Urinary tract infection, site not specified: Secondary | ICD-10-CM | POA: Diagnosis not present

## 2015-12-17 DIAGNOSIS — E041 Nontoxic single thyroid nodule: Secondary | ICD-10-CM | POA: Diagnosis not present

## 2015-12-17 DIAGNOSIS — E559 Vitamin D deficiency, unspecified: Secondary | ICD-10-CM | POA: Diagnosis not present

## 2015-12-17 DIAGNOSIS — I10 Essential (primary) hypertension: Secondary | ICD-10-CM | POA: Diagnosis not present

## 2015-12-21 DIAGNOSIS — H401122 Primary open-angle glaucoma, left eye, moderate stage: Secondary | ICD-10-CM | POA: Diagnosis not present

## 2015-12-21 DIAGNOSIS — H401113 Primary open-angle glaucoma, right eye, severe stage: Secondary | ICD-10-CM | POA: Diagnosis not present

## 2015-12-21 DIAGNOSIS — Z961 Presence of intraocular lens: Secondary | ICD-10-CM | POA: Diagnosis not present

## 2015-12-28 DIAGNOSIS — D899 Disorder involving the immune mechanism, unspecified: Secondary | ICD-10-CM | POA: Diagnosis not present

## 2015-12-28 DIAGNOSIS — R32 Unspecified urinary incontinence: Secondary | ICD-10-CM | POA: Diagnosis not present

## 2015-12-28 DIAGNOSIS — I10 Essential (primary) hypertension: Secondary | ICD-10-CM | POA: Diagnosis not present

## 2015-12-28 DIAGNOSIS — C50919 Malignant neoplasm of unspecified site of unspecified female breast: Secondary | ICD-10-CM | POA: Diagnosis not present

## 2015-12-28 DIAGNOSIS — Z1389 Encounter for screening for other disorder: Secondary | ICD-10-CM | POA: Diagnosis not present

## 2015-12-28 DIAGNOSIS — M859 Disorder of bone density and structure, unspecified: Secondary | ICD-10-CM | POA: Diagnosis not present

## 2015-12-28 DIAGNOSIS — Z6821 Body mass index (BMI) 21.0-21.9, adult: Secondary | ICD-10-CM | POA: Diagnosis not present

## 2015-12-28 DIAGNOSIS — F325 Major depressive disorder, single episode, in full remission: Secondary | ICD-10-CM | POA: Diagnosis not present

## 2015-12-28 DIAGNOSIS — M0600 Rheumatoid arthritis without rheumatoid factor, unspecified site: Secondary | ICD-10-CM | POA: Diagnosis not present

## 2015-12-28 DIAGNOSIS — M722 Plantar fascial fibromatosis: Secondary | ICD-10-CM | POA: Diagnosis not present

## 2015-12-28 DIAGNOSIS — Z0000 Encounter for general adult medical examination without abnormal findings: Secondary | ICD-10-CM | POA: Diagnosis not present

## 2015-12-28 DIAGNOSIS — J479 Bronchiectasis, uncomplicated: Secondary | ICD-10-CM | POA: Diagnosis not present

## 2016-01-07 ENCOUNTER — Ambulatory Visit (INDEPENDENT_AMBULATORY_CARE_PROVIDER_SITE_OTHER): Payer: Medicare Other | Admitting: Podiatry

## 2016-01-07 ENCOUNTER — Encounter: Payer: Self-pay | Admitting: Podiatry

## 2016-01-07 VITALS — BP 147/66 | HR 63 | Resp 16

## 2016-01-07 DIAGNOSIS — L84 Corns and callosities: Secondary | ICD-10-CM | POA: Diagnosis not present

## 2016-01-08 NOTE — Progress Notes (Signed)
Subjective:     Patient ID: Dawn Diaz, female   DOB: 1927/05/05, 82 y.o.   MRN: [id number]  HPI patient presents with callus formation on both feet   Review of Systems     Objective:   Physical Exam Neurovascular status on changed with keratotic lesions plantar bilateral    Assessment:     Lesion secondary to pressure    Plan:     Debride lesion with no iatrogenic bleeding except for where there was a small fissure on the left which I applied antibiotic ointment 2 with dressing and reappoint to recheck as needed

## 2016-01-12 ENCOUNTER — Ambulatory Visit
Admission: RE | Admit: 2016-01-12 | Discharge: 2016-01-12 | Disposition: A | Discharge status: Home / Self Care | Payer: Medicare Other | Source: Ambulatory Visit | Attending: Oncology | Admitting: Oncology

## 2016-01-12 DIAGNOSIS — R922 Inconclusive mammogram: Secondary | ICD-10-CM | POA: Diagnosis not present

## 2016-01-12 DIAGNOSIS — N6489 Other specified disorders of breast: Secondary | ICD-10-CM

## 2016-01-12 DIAGNOSIS — M2550 Pain in unspecified joint: Secondary | ICD-10-CM | POA: Diagnosis not present

## 2016-01-12 DIAGNOSIS — Z79899 Other long term (current) drug therapy: Secondary | ICD-10-CM | POA: Diagnosis not present

## 2016-01-18 DIAGNOSIS — F4322 Adjustment disorder with anxiety: Secondary | ICD-10-CM | POA: Diagnosis not present

## 2016-01-21 DIAGNOSIS — M810 Age-related osteoporosis without current pathological fracture: Secondary | ICD-10-CM | POA: Diagnosis not present

## 2016-01-21 DIAGNOSIS — Z79899 Other long term (current) drug therapy: Secondary | ICD-10-CM | POA: Diagnosis not present

## 2016-01-21 DIAGNOSIS — M19041 Primary osteoarthritis, right hand: Secondary | ICD-10-CM | POA: Diagnosis not present

## 2016-01-21 DIAGNOSIS — M0579 Rheumatoid arthritis with rheumatoid factor of multiple sites without organ or systems involvement: Secondary | ICD-10-CM | POA: Diagnosis not present

## 2016-02-11 DIAGNOSIS — H353131 Nonexudative age-related macular degeneration, bilateral, early dry stage: Secondary | ICD-10-CM | POA: Diagnosis not present

## 2016-03-05 DIAGNOSIS — Z23 Encounter for immunization: Secondary | ICD-10-CM | POA: Diagnosis not present

## 2016-04-07 DIAGNOSIS — N762 Acute vulvitis: Secondary | ICD-10-CM | POA: Diagnosis not present

## 2016-04-07 DIAGNOSIS — N952 Postmenopausal atrophic vaginitis: Secondary | ICD-10-CM | POA: Diagnosis not present

## 2016-04-07 DIAGNOSIS — B373 Candidiasis of vulva and vagina: Secondary | ICD-10-CM | POA: Diagnosis not present

## 2016-04-07 DIAGNOSIS — N760 Acute vaginitis: Secondary | ICD-10-CM | POA: Diagnosis not present

## 2016-04-19 ENCOUNTER — Ambulatory Visit (HOSPITAL_BASED_OUTPATIENT_CLINIC_OR_DEPARTMENT_OTHER): Payer: Medicare Other | Admitting: Oncology

## 2016-04-19 VITALS — BP 138/62 | HR 69 | Temp 97.8°F | Resp 16 | Ht 68.5 in | Wt 134.6 lb

## 2016-04-19 DIAGNOSIS — Z170 Estrogen receptor positive status [ER+]: Secondary | ICD-10-CM

## 2016-04-19 DIAGNOSIS — R918 Other nonspecific abnormal finding of lung field: Secondary | ICD-10-CM | POA: Diagnosis not present

## 2016-04-19 DIAGNOSIS — Z79810 Long term (current) use of selective estrogen receptor modulators (SERMs): Secondary | ICD-10-CM

## 2016-04-19 DIAGNOSIS — Z1231 Encounter for screening mammogram for malignant neoplasm of breast: Secondary | ICD-10-CM

## 2016-04-19 DIAGNOSIS — C50911 Malignant neoplasm of unspecified site of right female breast: Secondary | ICD-10-CM

## 2016-04-19 DIAGNOSIS — Z809 Family history of malignant neoplasm, unspecified: Secondary | ICD-10-CM

## 2016-04-19 DIAGNOSIS — D0511 Intraductal carcinoma in situ of right breast: Secondary | ICD-10-CM | POA: Diagnosis not present

## 2016-04-19 NOTE — Progress Notes (Signed)
  [facility] OFFICE PROGRESS NOTE   Diagnosis: Breast cancer  INTERVAL HISTORY:   Dawn Diaz returns as scheduled. She continues tamoxifen. A mammogram in January revealed an area of possible asymmetry in the left breast. A repeat diagnostic mammogram and ultrasound on 07/14/2015 revealed no suspicious mass or area of shadowing. A six-month follow-up left breast mammogram was negative. She feels well. No change over the chest wall. No symptom of thrombosis.    Objective:  Vital signs in last 24 hours:  Blood pressure 138/62, pulse 69, temperature 97.8 F (36.6 C), temperature source Oral, resp. rate 16, height 5' 8.5" (1.74 m), weight 134 lb 9.6 oz (61.1 kg), SpO2 98 %.    HEENT: Neck without mass Lymphatics: No cervical, supraclavicular, or right axillary nodes. Soft mobile 1 cm left axillary node versus a prominent fat pad Resp: Lungs clear bilaterally Cardio: Regular rate and rhythm GI: No hepatosplenomegaly Vascular: No leg edema Breast: Status post right mastectomy. No evidence for chest wall tumor recurrence per left breast without mass.     Medications: I have reviewed the patient's current medications.  Assessment/Plan: 1. Extensive DCIS of the right breast diagnosed in 1997, status post a right mastectomy. 2. Chest wall recurrence with invasive breast cancer (ER positive, PR positive and HER-2/neu positive), status post removal of the right breast implant and a wide excision procedure in January 2009 with negative surgical margins. She began Arimidex in April 2009. She completed adjuvant radiation to the right chest wall. She was unable to tolerate the Arimidex due to arthralgias. She was switched to tamoxifen in July 2009. 3. Tiny lung nodule on a CT of the chest in December 2008 - likely a benign finding. 4. History of multiple thyroid nodules - followed by Dr Dawn Diaz. 5. Extensive family history of cancer. Negative testing for BRCA1 and BRCA2 in  2015 6. Arthralgias secondary to Arimidex and Aromasin - she continues followup with Dr Dawn Diaz for "arthritis." She reports being diagnosed with rheumatoid arthritis. There has been clinical improvement with Plaquenil. 7. Status post right knee replacement October 2014    Disposition:  Ms. Dawn Diaz remains in clinical remission from breast cancer. She will continue tamoxifen. She'll be scheduled for a left mammogram in July 2018. She will return for an office visit in one year.  Dawn Coder, MD  04/19/2016  11:59 AM

## 2016-04-20 DIAGNOSIS — Z6821 Body mass index (BMI) 21.0-21.9, adult: Secondary | ICD-10-CM | POA: Diagnosis not present

## 2016-04-20 DIAGNOSIS — R42 Dizziness and giddiness: Secondary | ICD-10-CM | POA: Diagnosis not present

## 2016-04-21 DIAGNOSIS — H401123 Primary open-angle glaucoma, left eye, severe stage: Secondary | ICD-10-CM | POA: Diagnosis not present

## 2016-04-21 DIAGNOSIS — H401113 Primary open-angle glaucoma, right eye, severe stage: Secondary | ICD-10-CM | POA: Diagnosis not present

## 2016-04-27 DIAGNOSIS — H401113 Primary open-angle glaucoma, right eye, severe stage: Secondary | ICD-10-CM | POA: Diagnosis not present

## 2016-05-03 ENCOUNTER — Telehealth: Payer: Self-pay | Admitting: General Practice

## 2016-05-03 NOTE — Telephone Encounter (Signed)
Left msg regarding  October 2018 appt.

## 2016-05-27 DIAGNOSIS — L820 Inflamed seborrheic keratosis: Secondary | ICD-10-CM | POA: Diagnosis not present

## 2016-05-27 DIAGNOSIS — L817 Pigmented purpuric dermatosis: Secondary | ICD-10-CM | POA: Diagnosis not present

## 2016-06-15 ENCOUNTER — Ambulatory Visit (INDEPENDENT_AMBULATORY_CARE_PROVIDER_SITE_OTHER): Payer: Medicare Other | Admitting: Podiatry

## 2016-06-15 ENCOUNTER — Encounter: Payer: Self-pay | Admitting: Podiatry

## 2016-06-15 DIAGNOSIS — L84 Corns and callosities: Secondary | ICD-10-CM | POA: Diagnosis not present

## 2016-06-15 NOTE — Progress Notes (Signed)
Subjective:     Patient ID: Dawn Diaz, female   DOB: 1926-12-11, 82 y.o.   MRN: [id number]  HPI patient presents with lesions on both feet that become tender with elevated second toe right   Review of Systems     Objective:   Physical Exam Neurovascular status intact with keratotic lesion sub-fifth metatarsal left second digit right with elevation of the toe with moderate rigid contracture    Assessment:     Hammertoe deformity second digit right with keratotic lesion with lesion plantar left    Plan:     Debrided the lesions on both feet and applied padding the second digit and reviewed possible hammertoe procedures including tenotomy

## 2016-07-01 DIAGNOSIS — Z6821 Body mass index (BMI) 21.0-21.9, adult: Secondary | ICD-10-CM | POA: Diagnosis not present

## 2016-07-01 DIAGNOSIS — R32 Unspecified urinary incontinence: Secondary | ICD-10-CM | POA: Diagnosis not present

## 2016-07-01 DIAGNOSIS — J479 Bronchiectasis, uncomplicated: Secondary | ICD-10-CM | POA: Diagnosis not present

## 2016-07-01 DIAGNOSIS — H409 Unspecified glaucoma: Secondary | ICD-10-CM | POA: Diagnosis not present

## 2016-07-01 DIAGNOSIS — M0600 Rheumatoid arthritis without rheumatoid factor, unspecified site: Secondary | ICD-10-CM | POA: Diagnosis not present

## 2016-07-01 DIAGNOSIS — F325 Major depressive disorder, single episode, in full remission: Secondary | ICD-10-CM | POA: Diagnosis not present

## 2016-07-01 DIAGNOSIS — D899 Disorder involving the immune mechanism, unspecified: Secondary | ICD-10-CM | POA: Diagnosis not present

## 2016-07-01 DIAGNOSIS — I10 Essential (primary) hypertension: Secondary | ICD-10-CM | POA: Diagnosis not present

## 2016-07-05 DIAGNOSIS — R3982 Chronic bladder pain: Secondary | ICD-10-CM | POA: Diagnosis not present

## 2016-07-14 ENCOUNTER — Other Ambulatory Visit: Payer: Self-pay | Admitting: Rheumatology

## 2016-07-14 DIAGNOSIS — L280 Lichen simplex chronicus: Secondary | ICD-10-CM | POA: Diagnosis not present

## 2016-07-14 DIAGNOSIS — L432 Lichenoid drug reaction: Secondary | ICD-10-CM | POA: Diagnosis not present

## 2016-07-14 NOTE — Telephone Encounter (Signed)
Last Visit: 01/21/16 Next visit due February 2018 and message sent to front to schedule patient. Labs: 01/18/16 WNL PLQ Eye Exam: 02/11/16 WNL  Okay to refill PLQ?

## 2016-07-25 DIAGNOSIS — Z4802 Encounter for removal of sutures: Secondary | ICD-10-CM | POA: Diagnosis not present

## 2016-08-31 DIAGNOSIS — Z23 Encounter for immunization: Secondary | ICD-10-CM | POA: Diagnosis not present

## 2016-08-31 DIAGNOSIS — L821 Other seborrheic keratosis: Secondary | ICD-10-CM | POA: Diagnosis not present

## 2016-08-31 DIAGNOSIS — L570 Actinic keratosis: Secondary | ICD-10-CM | POA: Diagnosis not present

## 2016-10-20 ENCOUNTER — Other Ambulatory Visit: Payer: Self-pay | Admitting: Oncology

## 2016-11-01 DIAGNOSIS — L570 Actinic keratosis: Secondary | ICD-10-CM | POA: Diagnosis not present

## 2016-11-01 DIAGNOSIS — L738 Other specified follicular disorders: Secondary | ICD-10-CM | POA: Diagnosis not present

## 2016-11-01 DIAGNOSIS — L821 Other seborrheic keratosis: Secondary | ICD-10-CM | POA: Diagnosis not present

## 2016-11-01 DIAGNOSIS — L720 Epidermal cyst: Secondary | ICD-10-CM | POA: Diagnosis not present

## 2016-11-13 ENCOUNTER — Other Ambulatory Visit: Payer: Self-pay | Admitting: Rheumatology

## 2016-11-15 DIAGNOSIS — M19042 Primary osteoarthritis, left hand: Secondary | ICD-10-CM

## 2016-11-15 DIAGNOSIS — M19041 Primary osteoarthritis, right hand: Secondary | ICD-10-CM | POA: Insufficient documentation

## 2016-11-15 DIAGNOSIS — M8580 Other specified disorders of bone density and structure, unspecified site: Secondary | ICD-10-CM | POA: Insufficient documentation

## 2016-11-15 DIAGNOSIS — M0609 Rheumatoid arthritis without rheumatoid factor, multiple sites: Secondary | ICD-10-CM | POA: Insufficient documentation

## 2016-11-15 DIAGNOSIS — Z79899 Other long term (current) drug therapy: Secondary | ICD-10-CM | POA: Insufficient documentation

## 2016-11-15 NOTE — Progress Notes (Signed)
Office Visit Note  Patient: Dawn Diaz             Date of Birth: 09-Nov-1926           MRN: [id number]             PCP: Shon Baton, MD Referring: Shon Baton, MD Visit Date: 11/16/2016 Occupation: @GUAROCC @    Subjective:  No chief complaint on file.   History of Present Illness: Dawn Diaz is a 83 y.o. female last seen in our office 01/21/2016 in [facility].  History of rheumatoid arthritis that is seronegative.  On the last visit in August 2017, patient was on Plaquenil 200 mg twice a day, M-F.  PLQ Eye Exam: 02/11/16 WNL VIA Dr. Lorina Rabon  We refill Plaquenil on approximately 11/13/2016  Patient has a history of osteopenia with a T score of -1.3 in 2012. On the last visit in August, we offered her a repeat bone density but patient declined in August as well as to visit before then.  She was instructed to get labs done February 2018 but I do not see any lab results at this time in Epic.  Activities of Daily Living:  Patient reports morning stiffness for 15 minutes.   Patient Denies nocturnal pain.  Difficulty dressing/grooming: Denies Difficulty climbing stairs: Denies Difficulty getting out of chair: Denies Difficulty using hands for taps, buttons, cutlery, and/or writing: Denies   Review of Systems  Constitutional: Negative for fatigue.  HENT: Negative for mouth sores and mouth dryness.   Eyes: Negative for dryness.  Respiratory: Negative for shortness of breath.   Gastrointestinal: Negative for constipation and diarrhea.  Musculoskeletal: Negative for myalgias and myalgias.  Skin: Negative for sensitivity to sunlight.  Psychiatric/Behavioral: Negative for decreased concentration and sleep disturbance.    PMFS History:  Patient Active Problem List   Diagnosis Date Noted  . Rheumatoid arthritis, seronegative, multiple sites ([place]) 11/15/2016  . Osteopenia 11/15/2016  . Primary osteoarthritis of both hands 11/15/2016  . High risk medications (not  anticoagulants) long-term use 11/15/2016  . Family history of malignant neoplasm of gastrointestinal tract 12/25/2013  . Malignant neoplasm of female breast ([place]) 05/29/2013  . Constipation 04/24/2013  . Glaucoma 04/24/2013  . OA (osteoarthritis) of knee 04/15/2013  . HX: breast cancer 12/20/2010  . Rheumatoid arthritis(714.0) 12/20/2010  . DIVERTICULOSIS-COLON 08/06/2009  . IRRITABLE BOWEL SYNDROME 08/06/2009    Past Medical History:  Diagnosis Date  . Anxiety disorder   . Bacterial overgrowth syndrome   . Breast cancer, stage 1 [facility])    '97/recurrent '05 right breast(s/p mastectomy)  . Bunion   . Depression   . Diverticulitis   . GERD (gastroesophageal reflux disease)   . Glaucoma   . Hiatal hernia   . History of TIAs    - 13 yrs ago"brief memory loss"  . Irritable bowel syndrome   . Osteoporosis   . Ovarian cyst     Family History  Problem Relation Age of Onset  . Colon cancer Sister 10       Died at 40  . Kidney cancer Father 50  . Pancreatic cancer Mother 78  . Pancreatic cancer Maternal Grandmother 67  . Cancer Paternal Grandfather        died from "abdominal ca" at young age   Past Surgical History:  Procedure Laterality Date  . ABDOMINAL HYSTERECTOMY    . BREAST BIOPSY     right  . BUNIONECTOMY Bilateral    both, repeat x1  .  CATARACT EXTRACTION Left   . MASTECTOMY  1997   Right  . TONSILLECTOMY    . TOTAL KNEE ARTHROPLASTY Right 04/15/2013   Procedure: RIGHT TOTAL KNEE ARTHROPLASTY;  Surgeon: Gearlean Alf, MD;  Location: [facility];  Service: Orthopedics;  Laterality: Right;   Social History   Social History Narrative  . No narrative on file     Objective: Vital Signs: There were no vitals taken for this visit.   Physical Exam  Constitutional: She is oriented to person, place, and time. She appears well-developed and well-nourished.  HENT:  Head: Normocephalic and atraumatic.  Eyes: EOM are normal. Pupils are equal, round, and reactive to  light.  Cardiovascular: Normal rate, regular rhythm and normal heart sounds.  Exam reveals no gallop and no friction rub.   No murmur heard. Pulmonary/Chest: Effort normal and breath sounds normal. She has no wheezes. She has no rales.  Abdominal: Soft. Bowel sounds are normal. She exhibits no distension. There is no tenderness. There is no guarding. No hernia.  Musculoskeletal: Normal range of motion. She exhibits no edema, tenderness or deformity.  Lymphadenopathy:    She has no cervical adenopathy.  Neurological: She is alert and oriented to person, place, and time. Coordination normal.  Skin: Skin is warm and dry. Capillary refill takes less than 2 seconds. No rash noted.  Psychiatric: She has a normal mood and affect. Her behavior is normal.  Nursing note and vitals reviewed.    Musculoskeletal Exam:  Full range of motion of all joints Grip strength is equal and strong bilaterally Fibromyalgia tender points are all absent  CDAI Exam: CDAI Homunculus Exam:   Joint Counts:  CDAI Tender Joint count: 0 CDAI Swollen Joint count: 0  No synovitis   Investigation: No additional findings. No visits with results within 6 Month(s) from this visit.  Latest known visit with results is:  Admission on 09/08/2014, Discharged on 09/08/2014  Component Date Value Ref Range Status  . Sodium 09/08/2014 135  135 - 145 mmol/L Final  . Potassium 09/08/2014 3.7  3.5 - 5.1 mmol/L Final  . Chloride 09/08/2014 102  96 - 112 mmol/L Final  . CO2 09/08/2014 26  19 - 32 mmol/L Final  . Glucose, Bld 09/08/2014 107* 70 - 99 mg/dL Final  . BUN 09/08/2014 12  6 - 23 mg/dL Final  . Creatinine, Ser 09/08/2014 0.72  0.50 - 1.10 mg/dL Final  . Calcium 09/08/2014 8.2* 8.4 - 10.5 mg/dL Final  . GFR calc non Af Amer 09/08/2014 76* >90 mL/min Final  . GFR calc Af Amer 09/08/2014 88* >90 mL/min Final   Comment: (NOTE) The eGFR has been calculated using the CKD EPI equation. This calculation has not been  validated in all clinical situations. eGFR's persistently <90 mL/min signify possible Chronic Kidney Disease.   . Anion gap 09/08/2014 7  5 - 15 Final  . WBC 09/08/2014 5.4  4.0 - 10.5 K/uL Final  . RBC 09/08/2014 4.87  3.87 - 5.11 MIL/uL Final  . Hemoglobin 09/08/2014 14.6  12.0 - 15.0 g/dL Final  . HCT 09/08/2014 43.9  36.0 - 46.0 % Final  . MCV 09/08/2014 90.1  78.0 - 100.0 fL Final  . MCH 09/08/2014 30.0  26.0 - 34.0 pg Final  . MCHC 09/08/2014 33.3  30.0 - 36.0 g/dL Final  . RDW 09/08/2014 12.8  11.5 - 15.5 % Final  . Platelets 09/08/2014 183  150 - 400 K/uL Final  . Neutrophils Relative % 09/08/2014 74  43 - 77 % Final  . Neutro Abs 09/08/2014 4.0  1.7 - 7.7 K/uL Final  . Lymphocytes Relative 09/08/2014 19  12 - 46 % Final  . Lymphs Abs 09/08/2014 1.1  0.7 - 4.0 K/uL Final  . Monocytes Relative 09/08/2014 5  3 - 12 % Final  . Monocytes Absolute 09/08/2014 0.3  0.1 - 1.0 K/uL Final  . Eosinophils Relative 09/08/2014 1  0 - 5 % Final  . Eosinophils Absolute 09/08/2014 0.0  0.0 - 0.7 K/uL Final  . Basophils Relative 09/08/2014 1  0 - 1 % Final  . Basophils Absolute 09/08/2014 0.0  0.0 - 0.1 K/uL Final  . Troponin i, poc 09/08/2014 0.00  0.00 - 0.08 ng/mL Final  . Comment 3 09/08/2014          Final   Comment: Due to the release kinetics of cTnI, a negative result within the first hours of the onset of symptoms does not rule out myocardial infarction with certainty. If myocardial infarction is still suspected, repeat the test at appropriate intervals.      Imaging: No results found.  Speciality Comments: No specialty comments available.    Procedures:  No procedures performed Allergies: Amoxicillin and Latex   Assessment / Plan:     Visit Diagnoses: High risk medications (not anticoagulants) long-term use  Rheumatoid arthritis, seronegative, multiple sites ([place])  Primary osteoarthritis of both hands  Osteopenia, unspecified location - DEXA shows T score of -1.8  in 2012. ;  Patient declines repeat DEXA   Plan: #1: Rheumatoid arthritis; seronegative;  on the last visit, patient had no joint pain swelling and stiffness.  On today's visitPatient continues to have no joint pain, stiffness, swelling. She is really doing well. Adequate response from Plaquenil 2 mg twice a day Monday through Friday  #2: High-risk prescription On Plaquenil 200 mg twice a day Monday through Friday Although she can take 7 days a week since she is 5 foot 8 inches tall, we have changed her to Monday through Friday only to minimize her risk of Plaquenil toxicity. Today we verified that the new dose is working adequately for her rheumatoid arthritis. She'll be due for repeat Plaquenil eye exam August 2018 and I have reminded her of that. I've given her a blank Plaquenil eye exam form as a reminder to have it done promptly and have it sent to our office.  #3: Osteopenia. DEXA done in 2/ 2012 and showed a T score of -1.3 per office notes from [place] dated 11/21/2011 We offered her repeat DEXA in August and the visit before that one and she declined repeat DEXA each time.   Today, she would like to go but we are not sure which was the original site that dexa was done. Pt believes it was [facility]. Therefore, pt will have pcp refer there if appropriate.  #4: OA of the hands. Ongoing discomfort on occasion  #5: Patient was supposed to have CBC with differential and CMP with GFR done February 2018. I do not see any documentation of these labs. I will order these labs today in the office. Labs will be due in 5 months at the next office visit   Orders: No orders of the defined types were placed in this encounter.  No orders of the defined types were placed in this encounter.   Face-to-face time spent with patient was 30 minutes. 50% of time was spent in counseling and coordination of care.  Follow-Up Instructions:  No Follow-up on file.   Eliezer Lofts, PA-C  Note - This record has been created using [employer].  Chart creation errors have been sought, but may not always  have been located. Such creation errors do not reflect on  the standard of medical care.

## 2016-11-15 NOTE — Telephone Encounter (Addendum)
Last Visit: 01/21/16 Next visit: 11/16/16  Labs: 01/18/16 WNL PLQ Eye Exam: 02/11/16 WNL  Left message for patient that she is due for a follow up appointment and labs. Patient returned call and has been schedule for an appointment on 11/16/16. Will update labs at that appointment.   Okay to refill PLQ?

## 2016-11-16 ENCOUNTER — Ambulatory Visit (INDEPENDENT_AMBULATORY_CARE_PROVIDER_SITE_OTHER): Payer: Medicare Other | Admitting: Rheumatology

## 2016-11-16 ENCOUNTER — Encounter: Payer: Self-pay | Admitting: Rheumatology

## 2016-11-16 VITALS — BP 134/62 | HR 68 | Resp 14 | Ht 67.0 in | Wt 134.0 lb

## 2016-11-16 DIAGNOSIS — M0609 Rheumatoid arthritis without rheumatoid factor, multiple sites: Secondary | ICD-10-CM | POA: Diagnosis not present

## 2016-11-16 DIAGNOSIS — M19041 Primary osteoarthritis, right hand: Secondary | ICD-10-CM | POA: Diagnosis not present

## 2016-11-16 DIAGNOSIS — M19042 Primary osteoarthritis, left hand: Secondary | ICD-10-CM

## 2016-11-16 DIAGNOSIS — Z79899 Other long term (current) drug therapy: Secondary | ICD-10-CM | POA: Diagnosis not present

## 2016-11-16 DIAGNOSIS — M8580 Other specified disorders of bone density and structure, unspecified site: Secondary | ICD-10-CM | POA: Diagnosis not present

## 2016-11-16 LAB — CBC WITH DIFFERENTIAL/PLATELET
BASOS ABS: 48 {cells}/uL (ref 0–200)
Basophils Relative: 1 %
EOS ABS: 96 {cells}/uL (ref 15–500)
Eosinophils Relative: 2 %
HEMATOCRIT: 42.8 % (ref 35.0–45.0)
HEMOGLOBIN: 13.9 g/dL (ref 11.7–15.5)
Lymphocytes Relative: 23 %
Lymphs Abs: 1104 cells/uL (ref 850–3900)
MCH: 29 pg (ref 27.0–33.0)
MCHC: 32.5 g/dL (ref 32.0–36.0)
MCV: 89.2 fL (ref 80.0–100.0)
MONOS PCT: 10 %
MPV: 10.1 fL (ref 7.5–12.5)
Monocytes Absolute: 480 cells/uL (ref 200–950)
NEUTROS ABS: 3072 {cells}/uL (ref 1500–7800)
Neutrophils Relative %: 64 %
PLATELETS: 238 10*3/uL (ref 140–400)
RBC: 4.8 MIL/uL (ref 3.80–5.10)
RDW: 13.1 % (ref 11.0–15.0)
WBC: 4.8 10*3/uL (ref 3.8–10.8)

## 2016-11-16 LAB — COMPLETE METABOLIC PANEL WITH GFR
ALBUMIN: 3.5 g/dL — AB (ref 3.6–5.1)
ALK PHOS: 77 U/L (ref 33–130)
ALT: 14 U/L (ref 6–29)
AST: 21 U/L (ref 10–35)
BILIRUBIN TOTAL: 0.5 mg/dL (ref 0.2–1.2)
BUN: 19 mg/dL (ref 7–25)
CALCIUM: 9 mg/dL (ref 8.6–10.4)
CO2: 28 mmol/L (ref 20–31)
Chloride: 105 mmol/L (ref 98–110)
Creat: 0.96 mg/dL — ABNORMAL HIGH (ref 0.60–0.88)
GFR, EST AFRICAN AMERICAN: 61 mL/min (ref >=60)
GFR, EST NON AFRICAN AMERICAN: 53 mL/min — AB (ref >=60)
Glucose, Bld: 84 mg/dL (ref 65–99)
Potassium: 4.5 mmol/L (ref 3.5–5.3)
Sodium: 141 mmol/L (ref 135–146)
TOTAL PROTEIN: 6.1 g/dL (ref 6.1–8.1)

## 2016-11-16 NOTE — Patient Instructions (Signed)
  New fax number is [phone]

## 2016-11-17 LAB — VITAMIN D 25 HYDROXY (VIT D DEFICIENCY, FRACTURES): VIT D 25 HYDROXY: 27 ng/mL — AB (ref 30–100)

## 2016-11-21 ENCOUNTER — Telehealth: Payer: Self-pay | Admitting: *Deleted

## 2016-11-21 DIAGNOSIS — E559 Vitamin D deficiency, unspecified: Secondary | ICD-10-CM

## 2016-11-21 MED ORDER — VITAMIN D (ERGOCALCIFEROL) 1.25 MG (50000 UNIT) PO CAPS: 50000.0000 [IU] | ORAL_CAPSULE | ORAL | 0 refills | Status: DC

## 2016-11-21 NOTE — Telephone Encounter (Signed)
Prescription sent to the pharmacy. Patient advised of lab results and verbalized understanding. Will have Vitamin D rechecked in 3 months.

## 2016-11-21 NOTE — Telephone Encounter (Signed)
-----   Message from [place], [place] sent at 11/17/2016  9:49 AM EDT ----- I have sent a copy of these labs to PCP An tell patient #1: Vitamin D is low at 27; okay to treat the patient as follows: Vitamin D3, 50,000 units once a week 12 week; dispensed 12 pills with no refills Repeat vitamin D in 3 months  #2: CBC with differential is normal  #3: CMP with GFR shows mild elevation of creatinine and mild decrease in GFR. Patient should discuss this with her PCP. We will monitor this on the next labs.

## 2016-11-25 ENCOUNTER — Encounter: Payer: Self-pay | Admitting: Podiatry

## 2016-11-25 ENCOUNTER — Ambulatory Visit (INDEPENDENT_AMBULATORY_CARE_PROVIDER_SITE_OTHER): Payer: Medicare Other | Admitting: Podiatry

## 2016-11-25 DIAGNOSIS — L84 Corns and callosities: Secondary | ICD-10-CM

## 2016-11-25 DIAGNOSIS — M2041 Other hammer toe(s) (acquired), right foot: Secondary | ICD-10-CM

## 2016-11-25 DIAGNOSIS — M779 Enthesopathy, unspecified: Secondary | ICD-10-CM | POA: Diagnosis not present

## 2016-11-25 MED ORDER — TRIAMCINOLONE ACETONIDE 10 MG/ML IJ SUSP
10.0000 mg | Freq: Once | INTRAMUSCULAR | Status: AC
  Administered 2016-11-25: 10 mg

## 2016-11-25 NOTE — Progress Notes (Signed)
Subjective:    Patient ID: Dawn Diaz, female   DOB: 83 y.o.   MRN: [id number]   HPI patient presents with severe elevation digit 2 right stating she can't wear shoes and lesion underneath the left foot that sore    ROS      Objective:  Physical Exam neurovascular status intact with rigid contracture digit 2 right with dorsal keratotic lesion formation and fluid buildup around the interphalangeal joint area lesion sub-first metatarsal left     Assessment:   Significant hammertoe deformity digits 2 right with inflammatory capsulitis and reactive corn callus formation and callus formation sub-first metatarsal left      Plan:    H&P conditions reviewed and today I did a proximal nerve block of the right I did discuss hammertoe arthroplasty digit 2 right or possible tenotomy and at this point I injected the interphalangeal joint with 1 mg Dexon milligrams Kenalog debrided lesion fully applied padding with cushioned and debridement lesion plantar left. Reappoint to recheck and we may have to consider other treatments depending on response

## 2016-11-30 DIAGNOSIS — D1801 Hemangioma of skin and subcutaneous tissue: Secondary | ICD-10-CM | POA: Diagnosis not present

## 2016-11-30 DIAGNOSIS — D692 Other nonthrombocytopenic purpura: Secondary | ICD-10-CM | POA: Diagnosis not present

## 2016-11-30 DIAGNOSIS — L814 Other melanin hyperpigmentation: Secondary | ICD-10-CM | POA: Diagnosis not present

## 2016-11-30 DIAGNOSIS — D0471 Carcinoma in situ of skin of right lower limb, including hip: Secondary | ICD-10-CM | POA: Diagnosis not present

## 2016-11-30 DIAGNOSIS — D225 Melanocytic nevi of trunk: Secondary | ICD-10-CM | POA: Diagnosis not present

## 2016-11-30 DIAGNOSIS — L720 Epidermal cyst: Secondary | ICD-10-CM | POA: Diagnosis not present

## 2016-11-30 DIAGNOSIS — L821 Other seborrheic keratosis: Secondary | ICD-10-CM | POA: Diagnosis not present

## 2016-11-30 DIAGNOSIS — L84 Corns and callosities: Secondary | ICD-10-CM | POA: Diagnosis not present

## 2016-11-30 DIAGNOSIS — C44729 Squamous cell carcinoma of skin of left lower limb, including hip: Secondary | ICD-10-CM | POA: Diagnosis not present

## 2016-11-30 DIAGNOSIS — D485 Neoplasm of uncertain behavior of skin: Secondary | ICD-10-CM | POA: Diagnosis not present

## 2016-12-05 DIAGNOSIS — Z961 Presence of intraocular lens: Secondary | ICD-10-CM | POA: Diagnosis not present

## 2016-12-05 DIAGNOSIS — H401122 Primary open-angle glaucoma, left eye, moderate stage: Secondary | ICD-10-CM | POA: Diagnosis not present

## 2016-12-05 DIAGNOSIS — H401113 Primary open-angle glaucoma, right eye, severe stage: Secondary | ICD-10-CM | POA: Diagnosis not present

## 2016-12-08 ENCOUNTER — Other Ambulatory Visit: Payer: Self-pay | Admitting: Oncology

## 2016-12-08 DIAGNOSIS — Z1231 Encounter for screening mammogram for malignant neoplasm of breast: Secondary | ICD-10-CM

## 2016-12-23 DIAGNOSIS — M859 Disorder of bone density and structure, unspecified: Secondary | ICD-10-CM | POA: Diagnosis not present

## 2016-12-23 DIAGNOSIS — I10 Essential (primary) hypertension: Secondary | ICD-10-CM | POA: Diagnosis not present

## 2016-12-23 DIAGNOSIS — E041 Nontoxic single thyroid nodule: Secondary | ICD-10-CM | POA: Diagnosis not present

## 2016-12-30 DIAGNOSIS — Z0000 Encounter for general adult medical examination without abnormal findings: Secondary | ICD-10-CM | POA: Diagnosis not present

## 2016-12-30 DIAGNOSIS — M0600 Rheumatoid arthritis without rheumatoid factor, unspecified site: Secondary | ICD-10-CM | POA: Diagnosis not present

## 2016-12-30 DIAGNOSIS — F325 Major depressive disorder, single episode, in full remission: Secondary | ICD-10-CM | POA: Diagnosis not present

## 2016-12-30 DIAGNOSIS — D899 Disorder involving the immune mechanism, unspecified: Secondary | ICD-10-CM | POA: Diagnosis not present

## 2016-12-30 DIAGNOSIS — Z1389 Encounter for screening for other disorder: Secondary | ICD-10-CM | POA: Diagnosis not present

## 2016-12-30 DIAGNOSIS — J3089 Other allergic rhinitis: Secondary | ICD-10-CM | POA: Diagnosis not present

## 2016-12-30 DIAGNOSIS — M2040 Other hammer toe(s) (acquired), unspecified foot: Secondary | ICD-10-CM | POA: Diagnosis not present

## 2016-12-30 DIAGNOSIS — H4089 Other specified glaucoma: Secondary | ICD-10-CM | POA: Diagnosis not present

## 2016-12-30 DIAGNOSIS — J479 Bronchiectasis, uncomplicated: Secondary | ICD-10-CM | POA: Diagnosis not present

## 2016-12-30 DIAGNOSIS — M722 Plantar fascial fibromatosis: Secondary | ICD-10-CM | POA: Diagnosis not present

## 2016-12-30 DIAGNOSIS — I10 Essential (primary) hypertension: Secondary | ICD-10-CM | POA: Diagnosis not present

## 2016-12-30 DIAGNOSIS — Z6820 Body mass index (BMI) 20.0-20.9, adult: Secondary | ICD-10-CM | POA: Diagnosis not present

## 2017-01-04 DIAGNOSIS — H401113 Primary open-angle glaucoma, right eye, severe stage: Secondary | ICD-10-CM | POA: Diagnosis not present

## 2017-01-05 DIAGNOSIS — H401113 Primary open-angle glaucoma, right eye, severe stage: Secondary | ICD-10-CM | POA: Diagnosis not present

## 2017-01-05 DIAGNOSIS — Z961 Presence of intraocular lens: Secondary | ICD-10-CM | POA: Diagnosis not present

## 2017-01-05 DIAGNOSIS — D485 Neoplasm of uncertain behavior of skin: Secondary | ICD-10-CM | POA: Diagnosis not present

## 2017-01-05 DIAGNOSIS — H401122 Primary open-angle glaucoma, left eye, moderate stage: Secondary | ICD-10-CM | POA: Diagnosis not present

## 2017-01-12 ENCOUNTER — Ambulatory Visit
Admission: RE | Admit: 2017-01-12 | Discharge: 2017-01-12 | Disposition: A | Discharge status: Home / Self Care | Payer: Medicare Other | Source: Ambulatory Visit | Attending: Oncology | Admitting: Oncology

## 2017-01-12 DIAGNOSIS — Z1231 Encounter for screening mammogram for malignant neoplasm of breast: Secondary | ICD-10-CM

## 2017-01-23 ENCOUNTER — Other Ambulatory Visit: Payer: Self-pay | Admitting: Ophthalmology

## 2017-01-23 DIAGNOSIS — L821 Other seborrheic keratosis: Secondary | ICD-10-CM | POA: Diagnosis not present

## 2017-01-23 DIAGNOSIS — D2311 Other benign neoplasm of skin of right eyelid, including canthus: Secondary | ICD-10-CM | POA: Diagnosis not present

## 2017-01-28 ENCOUNTER — Telehealth: Payer: Self-pay

## 2017-01-28 NOTE — Telephone Encounter (Signed)
Called and left a message with a new appt as dr Benay Spice will be out of the  office  Dawn Diaz

## 2017-01-30 DIAGNOSIS — Z96651 Presence of right artificial knee joint: Secondary | ICD-10-CM | POA: Diagnosis not present

## 2017-01-30 DIAGNOSIS — M25561 Pain in right knee: Secondary | ICD-10-CM | POA: Diagnosis not present

## 2017-01-30 DIAGNOSIS — M1711 Unilateral primary osteoarthritis, right knee: Secondary | ICD-10-CM | POA: Diagnosis not present

## 2017-01-30 DIAGNOSIS — Z471 Aftercare following joint replacement surgery: Secondary | ICD-10-CM | POA: Diagnosis not present

## 2017-01-30 DIAGNOSIS — M25562 Pain in left knee: Secondary | ICD-10-CM | POA: Diagnosis not present

## 2017-01-30 DIAGNOSIS — M1712 Unilateral primary osteoarthritis, left knee: Secondary | ICD-10-CM | POA: Diagnosis not present

## 2017-02-03 ENCOUNTER — Other Ambulatory Visit: Payer: Self-pay | Admitting: Rheumatology

## 2017-02-03 ENCOUNTER — Telehealth: Payer: Self-pay | Admitting: Radiology

## 2017-02-03 NOTE — Telephone Encounter (Signed)
Patient needs Vitamin D labs call to remind her to come in for these

## 2017-02-06 NOTE — Telephone Encounter (Signed)
Notified via mychart.

## 2017-02-07 DIAGNOSIS — H353131 Nonexudative age-related macular degeneration, bilateral, early dry stage: Secondary | ICD-10-CM | POA: Diagnosis not present

## 2017-02-07 DIAGNOSIS — H43813 Vitreous degeneration, bilateral: Secondary | ICD-10-CM | POA: Diagnosis not present

## 2017-02-07 DIAGNOSIS — Z79899 Other long term (current) drug therapy: Secondary | ICD-10-CM | POA: Diagnosis not present

## 2017-02-14 DIAGNOSIS — L680 Hirsutism: Secondary | ICD-10-CM | POA: Diagnosis not present

## 2017-02-14 DIAGNOSIS — L570 Actinic keratosis: Secondary | ICD-10-CM | POA: Diagnosis not present

## 2017-02-14 DIAGNOSIS — L821 Other seborrheic keratosis: Secondary | ICD-10-CM | POA: Diagnosis not present

## 2017-02-20 ENCOUNTER — Other Ambulatory Visit: Payer: Self-pay | Admitting: Rheumatology

## 2017-02-21 NOTE — Telephone Encounter (Signed)
Left message to advise patient she will need labs before this can be refilled.

## 2017-02-25 ENCOUNTER — Emergency Department (HOSPITAL_COMMUNITY)
Admission: EM | Admit: 2017-02-25 | Discharge: 2017-02-25 | Disposition: A | Discharge status: Home / Self Care | Payer: Medicare Other | Attending: Emergency Medicine | Admitting: Emergency Medicine

## 2017-02-25 ENCOUNTER — Encounter (HOSPITAL_COMMUNITY): Payer: Self-pay | Admitting: Emergency Medicine

## 2017-02-25 DIAGNOSIS — M5481 Occipital neuralgia: Secondary | ICD-10-CM | POA: Insufficient documentation

## 2017-02-25 DIAGNOSIS — Z79899 Other long term (current) drug therapy: Secondary | ICD-10-CM | POA: Insufficient documentation

## 2017-02-25 DIAGNOSIS — R51 Headache: Secondary | ICD-10-CM | POA: Diagnosis present

## 2017-02-25 DIAGNOSIS — Z7982 Long term (current) use of aspirin: Secondary | ICD-10-CM | POA: Insufficient documentation

## 2017-02-25 DIAGNOSIS — G4489 Other headache syndrome: Secondary | ICD-10-CM | POA: Diagnosis not present

## 2017-02-25 DIAGNOSIS — R030 Elevated blood-pressure reading, without diagnosis of hypertension: Secondary | ICD-10-CM | POA: Diagnosis not present

## 2017-02-25 MED ORDER — GABAPENTIN 100 MG PO CAPS: 100.0000 mg | ORAL_CAPSULE | Freq: Three times a day (TID) | ORAL | 0 refills | Status: DC

## 2017-02-25 NOTE — ED Triage Notes (Signed)
Per EMS: pt from home c/o right sided HA that was severe in nature; pt with some facial droop that resolved after 31min; pt with 2/10 HA at present and no obvious neuro deficits; 18g L AC

## 2017-02-25 NOTE — Discharge Instructions (Signed)
I suspect your pain may be from occipital neuralgia. This can be very painful at times, but not life threatening. I would start by taking 200-400mg  of ibuprofen (or low dose of other NSAID aleve, naprosyn, etc) every 6 hours as needed. If this is not sufficient then try the gabapentin. If you headaches persist beyond a few days to a week then I would recommend seeing a neurologist. You may feel pain behind or around your eye with this condition, but it shouldn't cause visual changes or other neurologic symptoms such as numbness, tingling, weakness, changes in speech, etc. If you experience these then you need to return to the ED immediately.

## 2017-02-25 NOTE — ED Provider Notes (Signed)
[facility] DEPT Provider Note   CSN: [id number] Arrival date & time: 02/25/17  1101     History   Chief Complaint Chief Complaint  Patient presents with  . Headache    HPI Dawn Diaz is a 83 y.o. female.  HPI  83 year old female with headache. Acute onset of severe stabbing pain in right right occipital region with radiation to right temporal region and towards her eye. She has had this pain intermittently over the past couple days. Today's symptoms were more severe than she has had previously though. Family concern for possible drooping right eye versus squinting because of pain. No change in visual acuity. No numbness, tingling focal loss of strength. Denies any significant headache history.  Past Medical History:  Diagnosis Date  . Anxiety disorder   . Bacterial overgrowth syndrome   . Breast cancer, stage 1 [facility])    '97/recurrent '05 right breast(s/p mastectomy)  . Bunion   . Depression   . Diverticulitis   . GERD (gastroesophageal reflux disease)   . Glaucoma   . Hiatal hernia   . History of TIAs    - 13 yrs ago"brief memory loss"  . Irritable bowel syndrome   . Osteoporosis   . Ovarian cyst     Patient Active Problem List   Diagnosis Date Noted  . Rheumatoid arthritis, seronegative, multiple sites ([place]) 11/15/2016  . Osteopenia 11/15/2016  . Primary osteoarthritis of both hands 11/15/2016  . High risk medications (not anticoagulants) long-term use 11/15/2016  . Family history of malignant neoplasm of gastrointestinal tract 12/25/2013  . Malignant neoplasm of female breast ([place]) 05/29/2013  . Constipation 04/24/2013  . Glaucoma 04/24/2013  . OA (osteoarthritis) of knee 04/15/2013  . HX: breast cancer 12/20/2010  . Rheumatoid arthritis(714.0) 12/20/2010  . DIVERTICULOSIS-COLON 08/06/2009  . IRRITABLE BOWEL SYNDROME 08/06/2009    Past Surgical History:  Procedure Laterality Date  . ABDOMINAL HYSTERECTOMY    . BREAST BIOPSY     right  .  BUNIONECTOMY Bilateral    both, repeat x1  . CATARACT EXTRACTION Left   . MASTECTOMY  1997   Right  . TONSILLECTOMY    . TOTAL KNEE ARTHROPLASTY Right 04/15/2013   Procedure: RIGHT TOTAL KNEE ARTHROPLASTY;  Surgeon: Gearlean Alf, MD;  Location: [facility];  Service: Orthopedics;  Laterality: Right;    OB History    No data available       Home Medications    Prior to Admission medications   Medication Sig Start Date End Date Taking? Authorizing Provider  acetaminophen (TYLENOL) 325 MG tablet Take 2 tablets (650 mg total) by mouth every 6 (six) hours as needed. 04/18/13   Perkins, Alexzandrew L, PA-C  aspirin 81 MG tablet Take 81 mg by mouth daily. 09/30/13   [provider]  bifidobacterium infantis (ALIGN) capsule Take 1 capsule by mouth daily.    [provider]  Cholecalciferol 1000 units TBDP Take 1,000 Units by mouth at bedtime.    [provider]  dorzolamide (TRUSOPT) 2 % ophthalmic solution Place 1 drop into the right eye 2 (two) times daily.    [provider]  dorzolamide-timolol (COSOPT) 22.3-6.8 MG/ML ophthalmic solution PLACE 1 DROP INTO BOTH EYES TWICE A DAY 09/06/16   [provider]  escitalopram (LEXAPRO) 20 MG tablet Take 20 mg by mouth daily. 10/25/16   [provider]  gabapentin (NEURONTIN) 100 MG capsule Take 1 capsule (100 mg total) by mouth 3 (three) times daily. 02/25/17   Araya Roel,  Annie Main, MD  hydroxychloroquine (PLAQUENIL) 200 MG tablet TAKE 1 TABLET TWICE A DAY ON MONDAY THROUGH FRIDAY 11/15/16   Panwala, Naitik, PA-C  latanoprost (XALATAN) 0.005 % ophthalmic solution Place 1 drop into both eyes at bedtime.  06/18/11   [provider]  Misc Natural Products (LUTEIN 20 PO) Take 20 mg by mouth daily.    [provider]  naproxen sodium (ANAPROX) 220 MG tablet Take 220 mg by mouth daily as needed (pain).    [provider]  tamoxifen (NOLVADEX) 10 MG tablet TAKE 2 TABLETS ONCE  DAILY Patient not taking: Reported on 11/16/2016 10/20/16   Ladell Pier, MD  Vitamin D, Ergocalciferol, (DRISDOL) 50000 units CAPS capsule Take 1 capsule (50,000 Units total) by mouth every 7 (seven) days. 11/21/16   Eliezer Lofts, PA-C    Family History Family History  Problem Relation Age of Onset  . Colon cancer Sister 18       Died at 82  . Kidney cancer Father 16  . Pancreatic cancer Mother 66  . Pancreatic cancer Maternal Grandmother 27  . Cancer Paternal Grandfather        died from "abdominal ca" at young age  . Breast cancer Neg Hx     Social History Social History  Substance Use Topics  . Smoking status: Former Smoker    Years: 3.00    Types: Cigarettes    Quit date: 04/11/1951  . Smokeless tobacco: Never Used  . Alcohol use No     Allergies   Amoxicillin and Latex   Review of Systems Review of Systems  All systems reviewed and negative, other than as noted in HPI.  Physical Exam Updated Vital Signs BP (!) 151/89 (BP Location: Right Arm)   Temp 98.2 F (36.8 C)   Resp 16   SpO2 99%   Physical Exam  Constitutional: She is oriented to person, place, and time. She appears well-developed and well-nourished. No distress.  HENT:  Head: Normocephalic and atraumatic.  Eyes: Conjunctivae are normal. Right eye exhibits no discharge. Left eye exhibits no discharge.  Neck: Neck supple.  Cardiovascular: Normal rate, regular rhythm and normal heart sounds.  Exam reveals no gallop and no friction rub.   No murmur heard. Pulmonary/Chest: Effort normal and breath sounds normal. No respiratory distress.  Abdominal: Soft. She exhibits no distension. There is no tenderness.  Musculoskeletal: She exhibits no edema or tenderness.  Neurological: She is alert and oriented to person, place, and time. No cranial nerve deficit. She exhibits normal muscle tone. Coordination normal.  Skin: Skin is warm and dry.  Psychiatric: She has a normal mood and affect. Her behavior is  normal. Thought content normal.  Nursing note and vitals reviewed.    ED Treatments / Results  Labs (all labs ordered are listed, but only abnormal results are displayed) Labs Reviewed - No data to display  EKG  EKG Interpretation None       Radiology No results found.  Procedures Procedures (including critical care time)  Medications Ordered in ED Medications - No data to display   Initial Impression / Assessment and Plan / ED Course  I have reviewed the triage vital signs and the nursing notes.  Pertinent labs & imaging results that were available during my care of the patient were reviewed by me and considered in my medical decision making (see chart for details).     89yF with HA. I suspect she may have occipital neuralgia. Pain primarily on R in  expected location of occipital nerve(s). Could also explain shooting pain towards R temporal region. She describes a couple episodes of severe pain and now a dull ache. Non focal neuro exam and nonfocal neuro exam. She/daughters did not mention facial droop nor did I appreciate one on my exam. Arguing against this would be that she does not have point tenderness or dysesthesia in area of occipital nerve as I would expect.   I have a low suspicion for alternative emergent cause of her symptoms. No temporal tenderness. No visual changes. Nonfocal neuro exam. No neurologic complaints otherwise. Advised to try NSAIDs PRN. Low dose gabapentin if persists uncontrolled. Neurology FU if persists beyond a few days to a week. Immediate ER return for neuro deficits, fever, etc.   Final Clinical Impressions(s) / ED Diagnoses   Final diagnoses:  Occipital neuralgia of right side    New Prescriptions New Prescriptions   GABAPENTIN (NEURONTIN) 100 MG CAPSULE    Take 1 capsule (100 mg total) by mouth 3 (three) times daily.     Virgel Manifold, MD 03/14/17 [phone]

## 2017-02-27 DIAGNOSIS — N760 Acute vaginitis: Secondary | ICD-10-CM | POA: Diagnosis not present

## 2017-02-27 DIAGNOSIS — N762 Acute vulvitis: Secondary | ICD-10-CM | POA: Diagnosis not present

## 2017-03-20 DIAGNOSIS — H401113 Primary open-angle glaucoma, right eye, severe stage: Secondary | ICD-10-CM | POA: Diagnosis not present

## 2017-03-20 DIAGNOSIS — H4720 Unspecified optic atrophy: Secondary | ICD-10-CM | POA: Diagnosis not present

## 2017-03-20 DIAGNOSIS — H401122 Primary open-angle glaucoma, left eye, moderate stage: Secondary | ICD-10-CM | POA: Diagnosis not present

## 2017-03-21 ENCOUNTER — Emergency Department (HOSPITAL_COMMUNITY): Payer: Medicare Other

## 2017-03-21 ENCOUNTER — Inpatient Hospital Stay (HOSPITAL_COMMUNITY)
Admission: EM | Admit: 2017-03-21 | Discharge: 2017-03-24 | DRG: 470 | Disposition: A | Discharge status: Skilled Nursing Facility | Payer: Medicare Other | Attending: Internal Medicine | Admitting: Internal Medicine

## 2017-03-21 ENCOUNTER — Encounter (HOSPITAL_COMMUNITY): Payer: Self-pay | Admitting: Emergency Medicine

## 2017-03-21 DIAGNOSIS — C50919 Malignant neoplasm of unspecified site of unspecified female breast: Secondary | ICD-10-CM | POA: Diagnosis present

## 2017-03-21 DIAGNOSIS — R9431 Abnormal electrocardiogram [ECG] [EKG]: Secondary | ICD-10-CM | POA: Diagnosis not present

## 2017-03-21 DIAGNOSIS — F329 Major depressive disorder, single episode, unspecified: Secondary | ICD-10-CM | POA: Diagnosis present

## 2017-03-21 DIAGNOSIS — I69911 Memory deficit following unspecified cerebrovascular disease: Secondary | ICD-10-CM | POA: Diagnosis present

## 2017-03-21 DIAGNOSIS — Z87891 Personal history of nicotine dependence: Secondary | ICD-10-CM

## 2017-03-21 DIAGNOSIS — W010XXA Fall on same level from slipping, tripping and stumbling without subsequent striking against object, initial encounter: Secondary | ICD-10-CM | POA: Diagnosis present

## 2017-03-21 DIAGNOSIS — Z01818 Encounter for other preprocedural examination: Secondary | ICD-10-CM

## 2017-03-21 DIAGNOSIS — M25551 Pain in right hip: Secondary | ICD-10-CM | POA: Diagnosis not present

## 2017-03-21 DIAGNOSIS — H409 Unspecified glaucoma: Secondary | ICD-10-CM | POA: Diagnosis present

## 2017-03-21 DIAGNOSIS — M0600 Rheumatoid arthritis without rheumatoid factor, unspecified site: Secondary | ICD-10-CM | POA: Diagnosis present

## 2017-03-21 DIAGNOSIS — M0609 Rheumatoid arthritis without rheumatoid factor, multiple sites: Secondary | ICD-10-CM | POA: Diagnosis not present

## 2017-03-21 DIAGNOSIS — R911 Solitary pulmonary nodule: Secondary | ICD-10-CM | POA: Diagnosis not present

## 2017-03-21 DIAGNOSIS — Z9842 Cataract extraction status, left eye: Secondary | ICD-10-CM | POA: Diagnosis not present

## 2017-03-21 DIAGNOSIS — K5730 Diverticulosis of large intestine without perforation or abscess without bleeding: Secondary | ICD-10-CM | POA: Diagnosis present

## 2017-03-21 DIAGNOSIS — M19042 Primary osteoarthritis, left hand: Secondary | ICD-10-CM | POA: Diagnosis present

## 2017-03-21 DIAGNOSIS — Z7982 Long term (current) use of aspirin: Secondary | ICD-10-CM | POA: Diagnosis not present

## 2017-03-21 DIAGNOSIS — M810 Age-related osteoporosis without current pathological fracture: Secondary | ICD-10-CM | POA: Diagnosis present

## 2017-03-21 DIAGNOSIS — S72001A Fracture of unspecified part of neck of right femur, initial encounter for closed fracture: Secondary | ICD-10-CM | POA: Diagnosis present

## 2017-03-21 DIAGNOSIS — Z800 Family history of malignant neoplasm of digestive organs: Secondary | ICD-10-CM

## 2017-03-21 DIAGNOSIS — F419 Anxiety disorder, unspecified: Secondary | ICD-10-CM | POA: Diagnosis present

## 2017-03-21 DIAGNOSIS — Z9011 Acquired absence of right breast and nipple: Secondary | ICD-10-CM

## 2017-03-21 DIAGNOSIS — K219 Gastro-esophageal reflux disease without esophagitis: Secondary | ICD-10-CM | POA: Diagnosis present

## 2017-03-21 DIAGNOSIS — Z808 Family history of malignant neoplasm of other organs or systems: Secondary | ICD-10-CM

## 2017-03-21 DIAGNOSIS — M19041 Primary osteoarthritis, right hand: Secondary | ICD-10-CM | POA: Diagnosis present

## 2017-03-21 DIAGNOSIS — Z96651 Presence of right artificial knee joint: Secondary | ICD-10-CM | POA: Diagnosis present

## 2017-03-21 DIAGNOSIS — Z170 Estrogen receptor positive status [ER+]: Secondary | ICD-10-CM | POA: Diagnosis not present

## 2017-03-21 DIAGNOSIS — Z09 Encounter for follow-up examination after completed treatment for conditions other than malignant neoplasm: Secondary | ICD-10-CM

## 2017-03-21 DIAGNOSIS — K581 Irritable bowel syndrome with constipation: Secondary | ICD-10-CM | POA: Diagnosis not present

## 2017-03-21 DIAGNOSIS — S92153A Displaced avulsion fracture (chip fracture) of unspecified talus, initial encounter for closed fracture: Secondary | ICD-10-CM | POA: Diagnosis not present

## 2017-03-21 DIAGNOSIS — G8911 Acute pain due to trauma: Secondary | ICD-10-CM | POA: Diagnosis not present

## 2017-03-21 DIAGNOSIS — Z90710 Acquired absence of both cervix and uterus: Secondary | ICD-10-CM

## 2017-03-21 DIAGNOSIS — Z96641 Presence of right artificial hip joint: Secondary | ICD-10-CM | POA: Diagnosis not present

## 2017-03-21 DIAGNOSIS — Z419 Encounter for procedure for purposes other than remedying health state, unspecified: Secondary | ICD-10-CM

## 2017-03-21 DIAGNOSIS — Z881 Allergy status to other antibiotic agents status: Secondary | ICD-10-CM | POA: Diagnosis not present

## 2017-03-21 DIAGNOSIS — Z853 Personal history of malignant neoplasm of breast: Secondary | ICD-10-CM

## 2017-03-21 DIAGNOSIS — K449 Diaphragmatic hernia without obstruction or gangrene: Secondary | ICD-10-CM | POA: Diagnosis present

## 2017-03-21 DIAGNOSIS — Z471 Aftercare following joint replacement surgery: Secondary | ICD-10-CM | POA: Diagnosis not present

## 2017-03-21 DIAGNOSIS — Z91040 Latex allergy status: Secondary | ICD-10-CM

## 2017-03-21 DIAGNOSIS — S72011A Unspecified intracapsular fracture of right femur, initial encounter for closed fracture: Principal | ICD-10-CM | POA: Diagnosis present

## 2017-03-21 DIAGNOSIS — S72041A Displaced fracture of base of neck of right femur, initial encounter for closed fracture: Secondary | ICD-10-CM | POA: Diagnosis not present

## 2017-03-21 DIAGNOSIS — C50911 Malignant neoplasm of unspecified site of right female breast: Secondary | ICD-10-CM | POA: Diagnosis not present

## 2017-03-21 DIAGNOSIS — S72019A Unspecified intracapsular fracture of unspecified femur, initial encounter for closed fracture: Secondary | ICD-10-CM | POA: Diagnosis not present

## 2017-03-21 DIAGNOSIS — S79919A Unspecified injury of unspecified hip, initial encounter: Secondary | ICD-10-CM | POA: Diagnosis not present

## 2017-03-21 DIAGNOSIS — Z8051 Family history of malignant neoplasm of kidney: Secondary | ICD-10-CM

## 2017-03-21 DIAGNOSIS — T148XXA Other injury of unspecified body region, initial encounter: Secondary | ICD-10-CM | POA: Diagnosis not present

## 2017-03-21 DIAGNOSIS — Z791 Long term (current) use of non-steroidal anti-inflammatories (NSAID): Secondary | ICD-10-CM

## 2017-03-21 LAB — BASIC METABOLIC PANEL
ANION GAP: 9 (ref 5–15)
BUN: 24 mg/dL — ABNORMAL HIGH (ref 6–20)
CHLORIDE: 104 mmol/L (ref 101–111)
CO2: 25 mmol/L (ref 22–32)
Calcium: 8.6 mg/dL — ABNORMAL LOW (ref 8.9–10.3)
Creatinine, Ser: 0.72 mg/dL (ref 0.44–1.00)
GFR calc non Af Amer: >60 mL/min (ref >60)
Glucose, Bld: 112 mg/dL — ABNORMAL HIGH (ref 65–99)
POTASSIUM: 3.9 mmol/L (ref 3.5–5.1)
Sodium: 138 mmol/L (ref 135–145)

## 2017-03-21 LAB — CBC WITH DIFFERENTIAL/PLATELET
Basophils Absolute: 0.1 10*3/uL (ref 0.0–0.1)
Basophils Relative: 1 %
EOS ABS: 0.1 10*3/uL (ref 0.0–0.7)
EOS PCT: 2 %
HCT: 39.6 % (ref 36.0–46.0)
Hemoglobin: 13 g/dL (ref 12.0–15.0)
LYMPHS ABS: 1.2 10*3/uL (ref 0.7–4.0)
LYMPHS PCT: 19 %
MCH: 28.6 pg (ref 26.0–34.0)
MCHC: 32.8 g/dL (ref 30.0–36.0)
MCV: 87 fL (ref 78.0–100.0)
MONO ABS: 0.6 10*3/uL (ref 0.1–1.0)
Monocytes Relative: 10 %
Neutro Abs: 4.3 10*3/uL (ref 1.7–7.7)
Neutrophils Relative %: 68 %
PLATELETS: 211 10*3/uL (ref 150–400)
RBC: 4.55 MIL/uL (ref 3.87–5.11)
RDW: 13.3 % (ref 11.5–15.5)
WBC: 6.2 10*3/uL (ref 4.0–10.5)

## 2017-03-21 LAB — PROTIME-INR
INR: 1.02
Prothrombin Time: 13.3 seconds (ref 11.4–15.2)

## 2017-03-21 LAB — TYPE AND SCREEN
ABO/RH(D): A POS
Antibody Screen: NEGATIVE

## 2017-03-21 MED ORDER — FENTANYL CITRATE (PF) 100 MCG/2ML IJ SOLN
50.0000 ug | INTRAMUSCULAR | Status: DC | PRN
  Administered 2017-03-21: 50 ug via INTRAVENOUS
  Filled 2017-03-21 (×2): qty 2

## 2017-03-21 MED ORDER — LATANOPROST 0.005 % OP SOLN
1.0000 [drp] | Freq: Every day | OPHTHALMIC | Status: DC
  Filled 2017-03-21: qty 2.5

## 2017-03-21 MED ORDER — DORZOLAMIDE HCL-TIMOLOL MAL 2-0.5 % OP SOLN
1.0000 [drp] | Freq: Two times a day (BID) | OPHTHALMIC | Status: DC
  Filled 2017-03-21 (×2): qty 10

## 2017-03-21 MED ORDER — DORZOLAMIDE HCL 2 % OP SOLN
1.0000 [drp] | Freq: Two times a day (BID) | OPHTHALMIC | Status: DC
  Filled 2017-03-21 (×2): qty 10

## 2017-03-21 MED ORDER — ONDANSETRON HCL 4 MG/2ML IJ SOLN
4.0000 mg | Freq: Once | INTRAMUSCULAR | Status: AC
  Administered 2017-03-21: 4 mg via INTRAVENOUS
  Filled 2017-03-21: qty 2

## 2017-03-21 MED ORDER — MORPHINE SULFATE (PF) 4 MG/ML IV SOLN
0.5000 mg | INTRAVENOUS | Status: DC | PRN
  Administered 2017-03-22 (×2): 0.52 mg via INTRAVENOUS
  Filled 2017-03-21 (×2): qty 1

## 2017-03-21 NOTE — ED Provider Notes (Addendum)
[facility] DEPT Provider Note   CSN: [id number] Arrival date & time: 03/21/17  2030     History   Chief Complaint Chief Complaint  Patient presents with  . Fall  . Hip Pain    Right    HPI Dawn Diaz is a 83 y.o. female.  Patient is an 83 year old female with a history of rheumatoid arthritis, breast cancer, osteoporosis presenting today with severe pain in her right leg. Patient was on a walk with her family. They are turning around to come back when she stumbled and fell on her right hip. She has not been able to walk or stand since that time. Numbness or tingling.   The history is provided by the patient.  Fall  This is a new problem. The current episode started 1 to 2 hours ago. The problem occurs constantly. The problem has not changed since onset.Associated symptoms comments: Pain and deformity to the right hip.  No head injury or LOC.  Does not take anticoagulation. The symptoms are aggravated by bending and twisting. The symptoms are relieved by narcotics. Treatments tried: narcotics and immobilization. The treatment provided no relief.  Hip Pain     Past Medical History:  Diagnosis Date  . Anxiety disorder   . Bacterial overgrowth syndrome   . Breast cancer, stage 1 [facility])    '97/recurrent '05 right breast(s/p mastectomy)  . Bunion   . Depression   . Diverticulitis   . GERD (gastroesophageal reflux disease)   . Glaucoma   . Hiatal hernia   . History of TIAs    - 13 yrs ago"brief memory loss"  . Irritable bowel syndrome   . Osteoporosis   . Ovarian cyst     Patient Active Problem List   Diagnosis Date Noted  . Rheumatoid arthritis, seronegative, multiple sites ([place]) 11/15/2016  . Osteopenia 11/15/2016  . Primary osteoarthritis of both hands 11/15/2016  . High risk medications (not anticoagulants) long-term use 11/15/2016  . Family history of malignant neoplasm of gastrointestinal tract 12/25/2013  . Malignant neoplasm of female breast ([place])  05/29/2013  . Constipation 04/24/2013  . Glaucoma 04/24/2013  . OA (osteoarthritis) of knee 04/15/2013  . HX: breast cancer 12/20/2010  . Rheumatoid arthritis(714.0) 12/20/2010  . DIVERTICULOSIS-COLON 08/06/2009  . IRRITABLE BOWEL SYNDROME 08/06/2009    Past Surgical History:  Procedure Laterality Date  . ABDOMINAL HYSTERECTOMY    . BREAST BIOPSY     right  . BUNIONECTOMY Bilateral    both, repeat x1  . CATARACT EXTRACTION Left   . MASTECTOMY  1997   Right  . TONSILLECTOMY    . TOTAL KNEE ARTHROPLASTY Right 04/15/2013   Procedure: RIGHT TOTAL KNEE ARTHROPLASTY;  Surgeon: Gearlean Alf, MD;  Location: [facility];  Service: Orthopedics;  Laterality: Right;    OB History    No data available       Home Medications    Prior to Admission medications   Medication Sig Start Date End Date Taking? Authorizing Provider  acetaminophen (TYLENOL) 325 MG tablet Take 2 tablets (650 mg total) by mouth every 6 (six) hours as needed. 04/18/13   Perkins, Alexzandrew L, PA-C  aspirin 81 MG tablet Take 81 mg by mouth daily. 09/30/13   [provider]  bifidobacterium infantis (ALIGN) capsule Take 1 capsule by mouth daily.    [provider]  Cholecalciferol 1000 units TBDP Take 1,000 Units by mouth at bedtime.    [provider]  dorzolamide (TRUSOPT) 2 % ophthalmic solution  Place 1 drop into the right eye 2 (two) times daily.    [provider]  dorzolamide-timolol (COSOPT) 22.3-6.8 MG/ML ophthalmic solution PLACE 1 DROP INTO BOTH EYES TWICE A DAY 09/06/16   [provider]  escitalopram (LEXAPRO) 20 MG tablet Take 20 mg by mouth daily. 10/25/16   [provider]  gabapentin (NEURONTIN) 100 MG capsule Take 1 capsule (100 mg total) by mouth 3 (three) times daily. 02/25/17   Virgel Manifold, MD  hydroxychloroquine (PLAQUENIL) 200 MG tablet TAKE 1 TABLET TWICE A DAY ON MONDAY THROUGH FRIDAY 11/15/16   Panwala, Naitik, PA-C  latanoprost (XALATAN) 0.005  % ophthalmic solution Place 1 drop into both eyes at bedtime.  06/18/11   [provider]  Misc Natural Products (LUTEIN 20 PO) Take 20 mg by mouth daily.    [provider]  naproxen sodium (ANAPROX) 220 MG tablet Take 220 mg by mouth daily as needed (pain).    [provider]  tamoxifen (NOLVADEX) 10 MG tablet TAKE 2 TABLETS ONCE DAILY Patient not taking: Reported on 11/16/2016 10/20/16   Ladell Pier, MD  Vitamin D, Ergocalciferol, (DRISDOL) 50000 units CAPS capsule Take 1 capsule (50,000 Units total) by mouth every 7 (seven) days. 11/21/16   Eliezer Lofts, PA-C    Family History Family History  Problem Relation Age of Onset  . Colon cancer Sister 60       Died at 10  . Kidney cancer Father 68  . Pancreatic cancer Mother 38  . Pancreatic cancer Maternal Grandmother 62  . Cancer Paternal Grandfather        died from "abdominal ca" at young age  . Breast cancer Neg Hx     Social History Social History  Substance Use Topics  . Smoking status: Former Smoker    Years: 3.00    Types: Cigarettes    Quit date: 04/11/1951  . Smokeless tobacco: Never Used  . Alcohol use No     Allergies   Amoxicillin and Latex   Review of Systems Review of Systems  All other systems reviewed and are negative.    Physical Exam Updated Vital Signs BP (!) 171/75 (BP Location: Left Arm)   Pulse 72   Temp (!) 97.5 F (36.4 C) (Oral)   Resp 19   SpO2 98%   Physical Exam  Constitutional: She is oriented to person, place, and time. She appears well-developed and well-nourished. No distress.  HENT:  Head: Normocephalic and atraumatic.  Mouth/Throat: Oropharynx is clear and moist.  Eyes: Pupils are equal, round, and reactive to light. Conjunctivae and EOM are normal.  Neck: Normal range of motion. Neck supple.  Cardiovascular: Normal rate, regular rhythm and intact distal pulses.   No murmur heard. Pulmonary/Chest: Effort normal and breath sounds normal. No  respiratory distress. She has no wheezes. She has no rales.  Abdominal: Soft. She exhibits no distension. There is no tenderness. There is no rebound and no guarding.  Musculoskeletal: She exhibits no edema or tenderness.       Right hip: She exhibits decreased range of motion, bony tenderness and deformity.       Legs: Pulses are 2+ bilaterally in the DP. Able to move both feet without difficulty. Sensation intact.  Neurological: She is alert and oriented to person, place, and time.  Skin: Skin is warm and dry. Capillary refill takes less than 2 seconds. No rash noted. No erythema.  Psychiatric: She has a normal mood and affect. Her behavior is normal.  Nursing note and vitals reviewed.    ED Treatments / Results  Labs (all labs ordered are listed, but only abnormal results are displayed) Labs Reviewed  BASIC METABOLIC PANEL - Abnormal; Notable for the following:       Result Value   Glucose, Bld 112 (*)    BUN 24 (*)    Calcium 8.6 (*)    All other components within normal limits  CBC WITH DIFFERENTIAL/PLATELET  PROTIME-INR  TYPE AND SCREEN    EKG  EKG Interpretation  Date/Time:  Tuesday March 21 2017 22:28:56 EDT Ventricular Rate:  81 PR Interval:    QRS Duration: 86 QT Interval:  400 QTC Calculation: 465 R Axis:   53 Text Interpretation:  Sinus rhythm Atrial premature complex Probable left atrial enlargement RSR' in V1 or V2, right VCD or RVH No significant change since last tracing Confirmed by Blanchie Dessert [phone]) on 03/21/2017 10:42:45 PM       Radiology Dg Hip Unilat  With Pelvis 2-3 Views Right  Result Date: 03/21/2017 CLINICAL DATA:  Right hip pain post fall today. EXAM: DG HIP (WITH OR WITHOUT PELVIS) 2-3V RIGHT COMPARISON:  None. FINDINGS: Diffuse osteopenia. Mild symmetric degenerative change of the hips. Displaced subcapital/transcervical fracture of the right femoral neck. Moderate degenerate change of the spine. IMPRESSION: Displaced right femoral  neck fracture. Electronically Signed   By: Marin Olp M.D.   On: 03/21/2017 21:21   Dg Femur 1v Right  Result Date: 03/21/2017 CLINICAL DATA:  Fall today with right hip pain. EXAM: RIGHT FEMUR 1 VIEW COMPARISON:  None. FINDINGS: Examination demonstrates diffuse osteopenia. There mild degenerative changes of the right hip. There is a moderately displaced subcapital/transcervical fracture of the right femoral neck. Minimal focal irregularity over the mid acetabulum along the pelvic rim as would be difficult to exclude a fracture in this location as this was not appreciated on patient's right hip films. Evidence of previous right knee replacement with hardware intact. IMPRESSION: Displaced right femoral neck fracture. Cannot completely exclude a subtle fracture of the right mid acetabulum. Electronically Signed   By: Marin Olp M.D.   On: 03/21/2017 21:23    Procedures Procedures (including critical care time)  Medications Ordered in ED Medications  fentaNYL (SUBLIMAZE) injection 50 mcg (50 mcg Intravenous Given 03/21/17 2207)  ondansetron (ZOFRAN) injection 4 mg (4 mg Intravenous Given 03/21/17 2142)     Initial Impression / Assessment and Plan / ED Course  I have reviewed the triage vital signs and the nursing notes.  Pertinent labs & imaging results that were available during my care of the patient were reviewed by me and considered in my medical decision making (see chart for details).     Patient with a mechanical fall presenting today with deformity and pain of her right hip concerning for hip fracture. She was started on heparin per protocol. Patient received pain medicine prior to arrival.  X-ray consistent with intertrochanter fracture of the right. Will discuss with orthopedics. Labs and screening x-ray and EKG pending  Final Clinical Impressions(s) / ED Diagnoses   Final diagnoses:  Closed fracture of neck of right femur, initial encounter [facility])    New Prescriptions New  Prescriptions   No medications on file     Blanchie Dessert, MD 03/21/17 8546    Blanchie Dessert, MD 03/21/17 2315

## 2017-03-21 NOTE — Progress Notes (Signed)
Consult received for displaced femoral neck fracture. Hospitalist consult pending. Plan for surgery tomorrow. NPO after MN. Hold chemical DVT ppx (ok to continue ASA). CT R hip ordered to r/o acetabulum fx. Full consult note to follow.

## 2017-03-21 NOTE — ED Triage Notes (Signed)
Pt BIB EMS. Patient was walking with family members, when she accidentally bumped into one of them. Patient fell to ground onto right hip. Pt unable to move leg without significant pain. 200 mcg fentanyl given en route.

## 2017-03-21 NOTE — ED Notes (Signed)
Patient transported to X-ray 

## 2017-03-21 NOTE — H&P (Signed)
History and Physical    Dawn Diaz BJS:[id number] DOB: 05/04/1927 DOA: 03/21/2017  PCP: Shon Baton, MD  Patient coming from: Home.  Chief Complaint: Fall.  HPI: Dawn Diaz is a 83 y.o. female with history of seronegative rheumatoid arthritis on hydroxychloroquine, history of breast cancer status post mastectomy presents to the ER after patient had a fall. Patient states she was walking around her [place] when she suddenly tripped and fell. Denies hitting her head or losing consciousness. Denies any chest pain palpitations shortness of breath.   ED Course: In the ER CT hip revealed right hip fracture and on call orthopedic surgeon Dr. Delfino Lovett and patient is being admitted for surgical management.  Review of Systems: As per HPI, rest all negative.   Past Medical History:  Diagnosis Date  . Anxiety disorder   . Bacterial overgrowth syndrome   . Breast cancer, stage 1 [facility])    '97/recurrent '05 right breast(s/p mastectomy)  . Bunion   . Depression   . Diverticulitis   . GERD (gastroesophageal reflux disease)   . Glaucoma   . Hiatal hernia   . History of TIAs    - 13 yrs ago"brief memory loss"  . Irritable bowel syndrome   . Osteoporosis   . Ovarian cyst     Past Surgical History:  Procedure Laterality Date  . ABDOMINAL HYSTERECTOMY    . BREAST BIOPSY     right  . BUNIONECTOMY Bilateral    both, repeat x1  . CATARACT EXTRACTION Left   . MASTECTOMY  1997   Right  . TONSILLECTOMY    . TOTAL KNEE ARTHROPLASTY Right 04/15/2013   Procedure: RIGHT TOTAL KNEE ARTHROPLASTY;  Surgeon: Gearlean Alf, MD;  Location: [facility] OR[facility];  Service: Orthopedics;  Laterality: Right;     reports that she quit smoking about 65 years ago. Her smoking use included Cigarettes. She quit after 3.00 years of use. She has never used smokeless tobacco. She reports that she does not drink alcohol or use drugs.  Allergies  Allergen Reactions  . Amoxicillin Hives  . Latex Itching    Family  History  Problem Relation Age of Onset  . Colon cancer Sister 82       Died at 56  . Kidney cancer Father 77  . Pancreatic cancer Mother 24  . Pancreatic cancer Maternal Grandmother 70  . Cancer Paternal Grandfather        died from "abdominal ca" at young age  . Breast cancer Neg Hx     Prior to Admission medications   Medication Sig Start Date End Date Taking? Authorizing Provider  acetaminophen (TYLENOL) 325 MG tablet Take 2 tablets (650 mg total) by mouth every 6 (six) hours as needed. 04/18/13  Yes Perkins, Alexzandrew L, PA-C  aspirin 81 MG tablet Take 81 mg by mouth daily. 09/30/13  Yes [provider]  bifidobacterium infantis (ALIGN) capsule Take 1 capsule by mouth daily.   Yes [provider]  Cholecalciferol 1000 units TBDP Take 1,000 Units by mouth at bedtime.   Yes [provider]  dorzolamide-timolol (COSOPT) 22.3-6.8 MG/ML ophthalmic solution PLACE 1 DROP INTO BOTH EYES TWICE A DAY 09/06/16  Yes [provider]  escitalopram (LEXAPRO) 20 MG tablet Take 20 mg by mouth daily. 10/25/16  Yes [provider]  hydroxychloroquine (PLAQUENIL) 200 MG tablet TAKE 1 TABLET TWICE A DAY ON MONDAY THROUGH FRIDAY 11/15/16  Yes Panwala, Naitik, PA-C  latanoprost (XALATAN) 0.005 % ophthalmic solution Place 1  drop into both eyes at bedtime.  06/18/11  Yes [provider]  naproxen sodium (ANAPROX) 220 MG tablet Take 220 mg by mouth daily as needed (pain).   Yes [provider]  dorzolamide (TRUSOPT) 2 % ophthalmic solution Place 1 drop into the right eye 2 (two) times daily.    [provider]  gabapentin (NEURONTIN) 100 MG capsule Take 1 capsule (100 mg total) by mouth 3 (three) times daily. Patient not taking: Reported on 03/21/2017 02/25/17   Virgel Manifold, MD  tamoxifen (NOLVADEX) 10 MG tablet TAKE 2 TABLETS ONCE DAILY Patient not taking: Reported on 11/16/2016 10/20/16   Ladell Pier, MD  Vitamin D, Ergocalciferol,  (DRISDOL) 50000 units CAPS capsule Take 1 capsule (50,000 Units total) by mouth every 7 (seven) days. Patient not taking: Reported on 03/21/2017 11/21/16   Eliezer Lofts, PA-C    Physical Exam: Vitals:   03/21/17 2042 03/21/17 2053 03/21/17 2230  BP:  (!) 171/75 (!) 151/85  Pulse:  72 82  Resp:  19 19  Temp:  (!) 97.5 F (36.4 C)   TempSrc:  Oral   SpO2: 97% 98% 96%      Constitutional: Moderately built and nourished. Vitals:   03/21/17 2042 03/21/17 2053 03/21/17 2230  BP:  (!) 171/75 (!) 151/85  Pulse:  72 82  Resp:  19 19  Temp:  (!) 97.5 F (36.4 C)   TempSrc:  Oral   SpO2: 97% 98% 96%   Eyes: Anicteric no pallor. ENMT: No discharge from the ears eyes nose and mouth. Neck: No JVD appreciated no mass felt. Respiratory: No rhonchi or crepitations. Cardiovascular: S1-S2 heard no murmurs appreciated. Abdomen: Soft nontender bowel sounds present. Musculoskeletal: Pain on moving right hip. Skin: No rash. Neurologic: Alert awake oriented to time place and person. Moves all extremities. Psychiatric: Appears normal. Normal affect.   Labs on Admission: I have personally reviewed following labs and imaging studies  CBC:  Recent Labs Lab 03/21/17 2113  WBC 6.2  NEUTROABS 4.3  HGB 13.0  HCT 39.6  MCV 87.0  PLT 762   Basic Metabolic Panel:  Recent Labs Lab 03/21/17 2113  NA 138  K 3.9  CL 104  CO2 25  GLUCOSE 112*  BUN 24*  CREATININE 0.72  CALCIUM 8.6*   GFR: CrCl cannot be calculated (Unknown ideal weight.). Liver Function Tests: No results for input(s): AST, ALT, ALKPHOS, BILITOT, PROT, ALBUMIN in the last 168 hours. No results for input(s): LIPASE, AMYLASE in the last 168 hours. No results for input(s): AMMONIA in the last 168 hours. Coagulation Profile:  Recent Labs Lab 03/21/17 2113  INR 1.02   Cardiac Enzymes: No results for input(s): CKTOTAL, CKMB, CKMBINDEX, TROPONINI in the last 168 hours. BNP (last 3 results) No results for  input(s): PROBNP in the last 8760 hours. HbA1C: No results for input(s): HGBA1C in the last 72 hours. CBG: No results for input(s): GLUCAP in the last 168 hours. Lipid Profile: No results for input(s): CHOL, HDL, LDLCALC, TRIG, CHOLHDL, LDLDIRECT in the last 72 hours. Thyroid Function Tests: No results for input(s): TSH, T4TOTAL, FREET4, T3FREE, THYROIDAB in the last 72 hours. Anemia Panel: No results for input(s): VITAMINB12, FOLATE, FERRITIN, TIBC, IRON, RETICCTPCT in the last 72 hours. Urine analysis:    Component Value Date/Time   COLORURINE YELLOW 04/10/2013 1050   APPEARANCEUR CLEAR 04/10/2013 1050   LABSPEC 1.015 04/10/2013 1050   PHURINE 6.0 04/10/2013 1050   GLUCOSEU NEGATIVE 04/10/2013 1050   HGBUR NEGATIVE  04/10/2013 [place] 04/10/2013 [place] 04/10/2013 1050   PROTEINUR NEGATIVE 04/10/2013 1050   UROBILINOGEN 0.2 04/10/2013 1050   NITRITE NEGATIVE 04/10/2013 [place] 04/10/2013 1050   Sepsis Labs: @LABRCNTIP (procalcitonin:4,lacticidven:4) )No results found for this or any previous visit (from the past 240 hour(s)).   Radiological Exams on Admission: Ct Hip Right Wo Contrast  Result Date: 03/21/2017 CLINICAL DATA:  Fall onto right hip while walking with displaced fracture on x-rays. Possible acetabular fracture. EXAM: CT OF THE RIGHT HIP WITHOUT CONTRAST TECHNIQUE: Multidetector CT imaging of the right hip was performed according to the standard protocol. Multiplanar CT image reconstructions were also generated. COMPARISON:  Plain films earlier today.  CT 09/05/2011 FINDINGS: Bones/Joint/Cartilage Examination demonstrates evidence of patient's displaced subcapital right femoral neck fracture. There is no evidence of a mid acetabular fracture. No other fractures identified. Mild degenerative change of the right hip. Ligaments Suboptimally assessed by CT. Muscles and Tendons Unremarkable. Soft tissues Mild calcified  plaque over they right external iliac artery. Diverticulosis of the colon. IMPRESSION: Displaced subcapital fracture of the right femoral neck. No acetabular fracture. Electronically Signed   By: Marin Olp M.D.   On: 03/21/2017 22:29   Dg Hip Unilat  With Pelvis 2-3 Views Right  Result Date: 03/21/2017 CLINICAL DATA:  Right hip pain post fall today. EXAM: DG HIP (WITH OR WITHOUT PELVIS) 2-3V RIGHT COMPARISON:  None. FINDINGS: Diffuse osteopenia. Mild symmetric degenerative change of the hips. Displaced subcapital/transcervical fracture of the right femoral neck. Moderate degenerate change of the spine. IMPRESSION: Displaced right femoral neck fracture. Electronically Signed   By: Marin Olp M.D.   On: 03/21/2017 21:21   Dg Femur 1v Right  Result Date: 03/21/2017 CLINICAL DATA:  Fall today with right hip pain. EXAM: RIGHT FEMUR 1 VIEW COMPARISON:  None. FINDINGS: Examination demonstrates diffuse osteopenia. There mild degenerative changes of the right hip. There is a moderately displaced subcapital/transcervical fracture of the right femoral neck. Minimal focal irregularity over the mid acetabulum along the pelvic rim as would be difficult to exclude a fracture in this location as this was not appreciated on patient's right hip films. Evidence of previous right knee replacement with hardware intact. IMPRESSION: Displaced right femoral neck fracture. Cannot completely exclude a subtle fracture of the right mid acetabulum. Electronically Signed   By: Marin Olp M.D.   On: 03/21/2017 21:23    EKG: Independently reviewed. Normal sinus rhythm with RSR pattern and APC.  Assessment/Plan Active Problems:   Malignant neoplasm of female breast (HCC)   Rheumatoid arthritis, seronegative, multiple sites [facility])   Closed right hip fracture (HCC)   Closed right hip fracture, initial encounter ([place])    1. Right hip fracture status post mechanical fall - Patient is at moderate risk for intermediate risk  procedure will be kept nothing by mouth past midnight in anticipation of surgery. Preop chest x-ray is pending. Continue pain relief medications.  2.  Elevated blood pressure - likely related to pain. Closely monitor blood pressure trends.  3.  History of seronegative rheumatoid arthritis on hydroxychloroquine which can be continued after surgery.   DVT prophylaxis: SCDs. Code Status: Full code.  Family Communication: Discussed with patient.  Disposition Plan: Likely rehabilitation.  Consults called: Orthopedics.  Admission status: Inpatient.    Rise Patience MD [facility] Pager [phone].  If 7PM-7AM, please contact night-coverage [email or web address] Password [email or web address]  03/21/2017, 11:36 PM

## 2017-03-22 ENCOUNTER — Inpatient Hospital Stay (HOSPITAL_COMMUNITY): Payer: Medicare Other

## 2017-03-22 ENCOUNTER — Inpatient Hospital Stay: Admit: 2017-03-22 | Payer: Medicare Other | Admitting: Orthopedic Surgery

## 2017-03-22 ENCOUNTER — Encounter (HOSPITAL_COMMUNITY): Payer: Self-pay | Admitting: Orthopedic Surgery

## 2017-03-22 ENCOUNTER — Inpatient Hospital Stay (HOSPITAL_COMMUNITY): Payer: Medicare Other | Admitting: Anesthesiology

## 2017-03-22 ENCOUNTER — Encounter (HOSPITAL_COMMUNITY)
Admission: EM | Disposition: A | Discharge status: Skilled Nursing Facility | Payer: Self-pay | Source: Home / Self Care | Attending: Internal Medicine

## 2017-03-22 DIAGNOSIS — C50911 Malignant neoplasm of unspecified site of right female breast: Secondary | ICD-10-CM

## 2017-03-22 DIAGNOSIS — C50919 Malignant neoplasm of unspecified site of unspecified female breast: Secondary | ICD-10-CM | POA: Diagnosis present

## 2017-03-22 DIAGNOSIS — S72001A Fracture of unspecified part of neck of right femur, initial encounter for closed fracture: Secondary | ICD-10-CM | POA: Diagnosis present

## 2017-03-22 DIAGNOSIS — Z170 Estrogen receptor positive status [ER+]: Secondary | ICD-10-CM

## 2017-03-22 DIAGNOSIS — R911 Solitary pulmonary nodule: Secondary | ICD-10-CM

## 2017-03-22 HISTORY — PX: ANTERIOR APPROACH HEMI HIP ARTHROPLASTY: SHX6690

## 2017-03-22 LAB — CBC
HEMATOCRIT: 39.8 % (ref 36.0–46.0)
HEMOGLOBIN: 13 g/dL (ref 12.0–15.0)
MCH: 29 pg (ref 26.0–34.0)
MCHC: 32.7 g/dL (ref 30.0–36.0)
MCV: 88.6 fL (ref 78.0–100.0)
Platelets: 194 10*3/uL (ref 150–400)
RBC: 4.49 MIL/uL (ref 3.87–5.11)
RDW: 13.4 % (ref 11.5–15.5)
WBC: 7.9 10*3/uL (ref 4.0–10.5)

## 2017-03-22 LAB — BASIC METABOLIC PANEL
Anion gap: 8 (ref 5–15)
BUN: 20 mg/dL (ref 6–20)
CHLORIDE: 104 mmol/L (ref 101–111)
CO2: 26 mmol/L (ref 22–32)
CREATININE: 0.61 mg/dL (ref 0.44–1.00)
Calcium: 8.5 mg/dL — ABNORMAL LOW (ref 8.9–10.3)
GFR calc Af Amer: >60 mL/min (ref >60)
GFR calc non Af Amer: >60 mL/min (ref >60)
Glucose, Bld: 134 mg/dL — ABNORMAL HIGH (ref 65–99)
Potassium: 3.7 mmol/L (ref 3.5–5.1)
Sodium: 138 mmol/L (ref 135–145)

## 2017-03-22 LAB — TYPE AND SCREEN
ABO/RH(D): A POS
ANTIBODY SCREEN: NEGATIVE

## 2017-03-22 LAB — SURGICAL PCR SCREEN
MRSA, PCR: NEGATIVE
Staphylococcus aureus: NEGATIVE

## 2017-03-22 SURGERY — HEMIARTHROPLASTY, HIP, DIRECT ANTERIOR APPROACH, FOR FRACTURE
Anesthesia: General | Laterality: Right

## 2017-03-22 MED ORDER — FENTANYL CITRATE (PF) 100 MCG/2ML IJ SOLN
INTRAMUSCULAR | Status: AC
  Administered 2017-03-22: 50 ug via INTRAVENOUS
  Filled 2017-03-22: qty 2

## 2017-03-22 MED ORDER — CLINDAMYCIN PHOSPHATE 900 MG/50ML IV SOLN
900.0000 mg | INTRAVENOUS | Status: AC
  Administered 2017-03-22: 900 mg via INTRAVENOUS
  Filled 2017-03-22 (×2): qty 50

## 2017-03-22 MED ORDER — LACTATED RINGERS IV SOLN: INTRAVENOUS | Status: DC | PRN

## 2017-03-22 MED ORDER — FENTANYL CITRATE (PF) 100 MCG/2ML IJ SOLN
50.0000 ug | Freq: Once | INTRAMUSCULAR | Status: AC
  Administered 2017-03-22: 50 ug via INTRAVENOUS

## 2017-03-22 MED ORDER — CLINDAMYCIN PHOSPHATE 600 MG/50ML IV SOLN
600.0000 mg | Freq: Four times a day (QID) | INTRAVENOUS | Status: AC
  Administered 2017-03-22 – 2017-03-23 (×2): 600 mg via INTRAVENOUS
  Filled 2017-03-22 (×2): qty 50

## 2017-03-22 MED ORDER — DOCUSATE SODIUM 100 MG PO CAPS
100.0000 mg | ORAL_CAPSULE | Freq: Two times a day (BID) | ORAL | Status: DC
  Administered 2017-03-22 – 2017-03-24 (×4): 100 mg via ORAL
  Filled 2017-03-22 (×4): qty 1

## 2017-03-22 MED ORDER — DEXAMETHASONE SODIUM PHOSPHATE 10 MG/ML IJ SOLN
INTRAMUSCULAR | Status: DC | PRN
  Administered 2017-03-22: 10 mg via INTRAVENOUS

## 2017-03-22 MED ORDER — FENTANYL CITRATE (PF) 100 MCG/2ML IJ SOLN
INTRAMUSCULAR | Status: DC | PRN
Start: 2017-03-22 — End: 2017-03-22
  Administered 2017-03-22: 50 ug via INTRAVENOUS
  Administered 2017-03-22: 75 ug via INTRAVENOUS
  Administered 2017-03-22: 50 ug via INTRAVENOUS
  Administered 2017-03-22: 25 ug via INTRAVENOUS

## 2017-03-22 MED ORDER — HYDROCODONE-ACETAMINOPHEN 5-325 MG PO TABS
1.0000 | ORAL_TABLET | Freq: Four times a day (QID) | ORAL | Status: DC | PRN
  Administered 2017-03-22 – 2017-03-23 (×3): 1 via ORAL
  Administered 2017-03-24: 2 via ORAL
  Administered 2017-03-24: 1 via ORAL
  Filled 2017-03-22: qty 2
  Filled 2017-03-22 (×5): qty 1

## 2017-03-22 MED ORDER — FENTANYL CITRATE (PF) 250 MCG/5ML IJ SOLN
INTRAMUSCULAR | Status: AC
  Filled 2017-03-22: qty 5

## 2017-03-22 MED ORDER — PHENYLEPHRINE HCL 10 MG/ML IJ SOLN
INTRAVENOUS | Status: DC | PRN
  Administered 2017-03-22: 25 ug/min via INTRAVENOUS

## 2017-03-22 MED ORDER — LIDOCAINE HCL (CARDIAC) 20 MG/ML IV SOLN
INTRAVENOUS | Status: DC | PRN
  Administered 2017-03-22: 60 mg via INTRAVENOUS

## 2017-03-22 MED ORDER — CHLORHEXIDINE GLUCONATE 4 % EX LIQD: 60.0000 mL | Freq: Once | CUTANEOUS | Status: DC

## 2017-03-22 MED ORDER — TRANEXAMIC ACID 1000 MG/10ML IV SOLN
1000.0000 mg | INTRAVENOUS | Status: AC
  Administered 2017-03-22: 1000 mg via INTRAVENOUS
  Filled 2017-03-22: qty 1100

## 2017-03-22 MED ORDER — SENNA 8.6 MG PO TABS
1.0000 | ORAL_TABLET | Freq: Two times a day (BID) | ORAL | Status: DC
  Administered 2017-03-22 – 2017-03-24 (×4): 8.6 mg via ORAL
  Filled 2017-03-22 (×4): qty 1

## 2017-03-22 MED ORDER — ACETAMINOPHEN 650 MG RE SUPP: 650.0000 mg | Freq: Four times a day (QID) | RECTAL | Status: DC | PRN

## 2017-03-22 MED ORDER — ONDANSETRON HCL 4 MG PO TABS: 4.0000 mg | ORAL_TABLET | Freq: Four times a day (QID) | ORAL | Status: DC | PRN

## 2017-03-22 MED ORDER — LIDOCAINE 2% (20 MG/ML) 5 ML SYRINGE
INTRAMUSCULAR | Status: AC
  Filled 2017-03-22: qty 5

## 2017-03-22 MED ORDER — PHENOL 1.4 % MT LIQD: 1.0000 | OROMUCOSAL | Status: DC | PRN

## 2017-03-22 MED ORDER — 0.9 % SODIUM CHLORIDE (POUR BTL) OPTIME
TOPICAL | Status: DC | PRN
  Administered 2017-03-22: 1000 mL

## 2017-03-22 MED ORDER — KETOROLAC TROMETHAMINE 30 MG/ML IJ SOLN
INTRAMUSCULAR | Status: DC | PRN
  Administered 2017-03-22: 30 mg

## 2017-03-22 MED ORDER — BUPIVACAINE-EPINEPHRINE (PF) 0.5% -1:200000 IJ SOLN
INTRAMUSCULAR | Status: DC | PRN
  Administered 2017-03-22: 30 mL

## 2017-03-22 MED ORDER — ONDANSETRON HCL 4 MG/2ML IJ SOLN
4.0000 mg | Freq: Four times a day (QID) | INTRAMUSCULAR | Status: AC | PRN
  Administered 2017-03-22: 4 mg via INTRAVENOUS

## 2017-03-22 MED ORDER — SUGAMMADEX SODIUM 200 MG/2ML IV SOLN
INTRAVENOUS | Status: AC
  Filled 2017-03-22: qty 2

## 2017-03-22 MED ORDER — OXYCODONE HCL 5 MG/5ML PO SOLN: 5.0000 mg | Freq: Once | ORAL | Status: DC | PRN

## 2017-03-22 MED ORDER — ROCURONIUM BROMIDE 10 MG/ML (PF) SYRINGE
PREFILLED_SYRINGE | INTRAVENOUS | Status: AC
  Filled 2017-03-22: qty 5

## 2017-03-22 MED ORDER — ROCURONIUM BROMIDE 100 MG/10ML IV SOLN
INTRAVENOUS | Status: DC | PRN
  Administered 2017-03-22: 40 mg via INTRAVENOUS

## 2017-03-22 MED ORDER — METOCLOPRAMIDE HCL 5 MG/ML IJ SOLN: 5.0000 mg | Freq: Three times a day (TID) | INTRAMUSCULAR | Status: DC | PRN

## 2017-03-22 MED ORDER — POLYETHYLENE GLYCOL 3350 17 G PO PACK: 17.0000 g | PACK | Freq: Every day | ORAL | Status: DC | PRN

## 2017-03-22 MED ORDER — ONDANSETRON HCL 4 MG/2ML IJ SOLN: 4.0000 mg | Freq: Four times a day (QID) | INTRAMUSCULAR | Status: DC | PRN

## 2017-03-22 MED ORDER — BUPIVACAINE-EPINEPHRINE (PF) 0.5% -1:200000 IJ SOLN
INTRAMUSCULAR | Status: AC
  Filled 2017-03-22: qty 30

## 2017-03-22 MED ORDER — PROPOFOL 10 MG/ML IV BOLUS
INTRAVENOUS | Status: AC
  Filled 2017-03-22: qty 20

## 2017-03-22 MED ORDER — ACETAMINOPHEN 325 MG PO TABS: 650.0000 mg | ORAL_TABLET | Freq: Four times a day (QID) | ORAL | Status: DC | PRN

## 2017-03-22 MED ORDER — ONDANSETRON HCL 4 MG/2ML IJ SOLN
INTRAMUSCULAR | Status: AC
  Administered 2017-03-22: 4 mg via INTRAVENOUS
  Filled 2017-03-22: qty 2

## 2017-03-22 MED ORDER — METHOCARBAMOL 1000 MG/10ML IJ SOLN
500.0000 mg | Freq: Four times a day (QID) | INTRAVENOUS | Status: DC | PRN
  Filled 2017-03-22: qty 5

## 2017-03-22 MED ORDER — SODIUM CHLORIDE 0.9 % IR SOLN
Status: DC | PRN
  Administered 2017-03-22: 3000 mL

## 2017-03-22 MED ORDER — ONDANSETRON HCL 4 MG/2ML IJ SOLN
INTRAMUSCULAR | Status: AC
  Filled 2017-03-22: qty 2

## 2017-03-22 MED ORDER — SODIUM CHLORIDE 0.9 % IJ SOLN
INTRAMUSCULAR | Status: DC | PRN
  Administered 2017-03-22: 30 mL

## 2017-03-22 MED ORDER — SUGAMMADEX SODIUM 200 MG/2ML IV SOLN
INTRAVENOUS | Status: DC | PRN
  Administered 2017-03-22: 200 mg via INTRAVENOUS

## 2017-03-22 MED ORDER — METHOCARBAMOL 500 MG PO TABS
500.0000 mg | ORAL_TABLET | Freq: Four times a day (QID) | ORAL | Status: DC | PRN
  Administered 2017-03-22: 500 mg via ORAL
  Filled 2017-03-22: qty 1

## 2017-03-22 MED ORDER — KETOROLAC TROMETHAMINE 30 MG/ML IJ SOLN
INTRAMUSCULAR | Status: AC
Start: 2017-03-22 — End: ?
  Filled 2017-03-22: qty 1

## 2017-03-22 MED ORDER — POVIDONE-IODINE 10 % EX SWAB: 2.0000 "application " | Freq: Once | CUTANEOUS | Status: DC

## 2017-03-22 MED ORDER — DEXAMETHASONE SODIUM PHOSPHATE 10 MG/ML IJ SOLN
INTRAMUSCULAR | Status: AC
  Filled 2017-03-22: qty 1

## 2017-03-22 MED ORDER — MENTHOL 3 MG MT LOZG: 1.0000 | LOZENGE | OROMUCOSAL | Status: DC | PRN

## 2017-03-22 MED ORDER — ONDANSETRON HCL 4 MG/2ML IJ SOLN
INTRAMUSCULAR | Status: DC | PRN
Start: 2017-03-22 — End: 2017-03-22
  Administered 2017-03-22: 4 mg via INTRAVENOUS

## 2017-03-22 MED ORDER — METOCLOPRAMIDE HCL 5 MG PO TABS: 5.0000 mg | ORAL_TABLET | Freq: Three times a day (TID) | ORAL | Status: DC | PRN

## 2017-03-22 MED ORDER — ACETAMINOPHEN 10 MG/ML IV SOLN
1000.0000 mg | INTRAVENOUS | Status: AC
  Administered 2017-03-22: 1000 mg via INTRAVENOUS
  Filled 2017-03-22: qty 100

## 2017-03-22 MED ORDER — FENTANYL CITRATE (PF) 100 MCG/2ML IJ SOLN: 25.0000 ug | INTRAMUSCULAR | Status: DC | PRN

## 2017-03-22 MED ORDER — PROPOFOL 10 MG/ML IV BOLUS
INTRAVENOUS | Status: DC | PRN
  Administered 2017-03-22: 90 mg via INTRAVENOUS

## 2017-03-22 MED ORDER — LACTATED RINGERS IV SOLN
INTRAVENOUS | Status: DC
  Administered 2017-03-22: 10:00:00 via INTRAVENOUS

## 2017-03-22 MED ORDER — OXYCODONE HCL 5 MG PO TABS: 5.0000 mg | ORAL_TABLET | Freq: Once | ORAL | Status: DC | PRN

## 2017-03-22 MED ORDER — ASPIRIN 81 MG PO CHEW
81.0000 mg | CHEWABLE_TABLET | Freq: Two times a day (BID) | ORAL | Status: DC
  Administered 2017-03-22 – 2017-03-24 (×4): 81 mg via ORAL
  Filled 2017-03-22 (×4): qty 1

## 2017-03-22 SURGICAL SUPPLY — 41 items
BAG DECANTER FOR FLEXI CONT (MISCELLANEOUS) IMPLANT
BAG ZIPLOCK 12X15 (MISCELLANEOUS) IMPLANT
CHLORAPREP W/TINT 26ML (MISCELLANEOUS) ×3 IMPLANT
CLOTH BEACON ORANGE TIMEOUT ST (SAFETY) ×3 IMPLANT
COVER PERINEAL POST (MISCELLANEOUS) ×3 IMPLANT
COVER SURGICAL LIGHT HANDLE (MISCELLANEOUS) ×3 IMPLANT
DECANTER SPIKE VIAL GLASS SM (MISCELLANEOUS) ×3 IMPLANT
DERMABOND ADVANCED (GAUZE/BANDAGES/DRESSINGS) ×4
DERMABOND ADVANCED .7 DNX12 (GAUZE/BANDAGES/DRESSINGS) ×2 IMPLANT
DRAPE SHEET LG 3/4 BI-LAMINATE (DRAPES) ×9 IMPLANT
DRAPE STERI IOBAN 125X83 (DRAPES) ×3 IMPLANT
DRAPE U-SHAPE 47X51 STRL (DRAPES) ×6 IMPLANT
DRSG AQUACEL AG ADV 3.5X10 (GAUZE/BANDAGES/DRESSINGS) ×3 IMPLANT
ELECT PENCIL ROCKER SW 15FT (MISCELLANEOUS) ×3 IMPLANT
ELECT REM PT RETURN 15FT ADLT (MISCELLANEOUS) ×3 IMPLANT
GAUZE SPONGE 4X4 12PLY STRL (GAUZE/BANDAGES/DRESSINGS) ×3 IMPLANT
GLOVE BIO SURGEON STRL SZ8.5 (GLOVE) ×6 IMPLANT
GLOVE BIOGEL PI IND STRL 8.5 (GLOVE) ×1 IMPLANT
GLOVE BIOGEL PI INDICATOR 8.5 (GLOVE) ×2
GOWN SPEC L3 XXLG W/TWL (GOWN DISPOSABLE) ×3 IMPLANT
HANDPIECE INTERPULSE COAX TIP (DISPOSABLE) ×2
HOLDER FOLEY CATH W/STRAP (MISCELLANEOUS) ×3 IMPLANT
HOOD PEEL AWAY FLYTE STAYCOOL (MISCELLANEOUS) ×6 IMPLANT
MARKER SKIN DUAL TIP RULER LAB (MISCELLANEOUS) ×3 IMPLANT
NEEDLE SPNL 18GX3.5 QUINCKE PK (NEEDLE) ×3 IMPLANT
PACK ANTERIOR HIP CUSTOM (KITS) ×3 IMPLANT
SAW OSC TIP CART 19.5X105X1.3 (SAW) ×3 IMPLANT
SEALER BIPOLAR AQUA 6.0 (INSTRUMENTS) ×3 IMPLANT
SET HNDPC FAN SPRY TIP SCT (DISPOSABLE) ×1 IMPLANT
SUT ETHIBOND NAB CT1 #1 30IN (SUTURE) ×6 IMPLANT
SUT MNCRL AB 3-0 PS2 18 (SUTURE) ×3 IMPLANT
SUT MON AB 2-0 CT1 36 (SUTURE) ×6 IMPLANT
SUT STRATAFIX PDO 1 14 VIOLET (SUTURE) ×2
SUT STRATFX PDO 1 14 VIOLET (SUTURE) ×1
SUT VIC AB 2-0 CT1 27 (SUTURE) ×2
SUT VIC AB 2-0 CT1 TAPERPNT 27 (SUTURE) ×1 IMPLANT
SUTURE STRATFX PDO 1 14 VIOLET (SUTURE) ×1 IMPLANT
SYR 50ML LL SCALE MARK (SYRINGE) ×3 IMPLANT
TRAY FOLEY W/METER SILVER 16FR (SET/KITS/TRAYS/PACK) IMPLANT
WATER STERILE IRR 1500ML POUR (IV SOLUTION) ×3 IMPLANT
YANKAUER SUCT BULB TIP 10FT TU (MISCELLANEOUS) ×3 IMPLANT

## 2017-03-22 SURGICAL SUPPLY — 60 items
BLADE SAW SGTL 18X1.27X75 (BLADE) ×2 IMPLANT
BLADE SAW SGTL 18X1.27X75MM (BLADE) ×1
CAPT HIP HEMI 2 ×3 IMPLANT
CHLORAPREP W/TINT 26ML (MISCELLANEOUS) ×3 IMPLANT
COVER SURGICAL LIGHT HANDLE (MISCELLANEOUS) ×3 IMPLANT
DERMABOND ADVANCED (GAUZE/BANDAGES/DRESSINGS) ×2
DERMABOND ADVANCED .7 DNX12 (GAUZE/BANDAGES/DRESSINGS) ×1 IMPLANT
DRAPE C-ARM 42X72 X-RAY (DRAPES) ×3 IMPLANT
DRAPE IMP U-DRAPE 54X76 (DRAPES) ×6 IMPLANT
DRAPE STERI IOBAN 125X83 (DRAPES) ×3 IMPLANT
DRAPE U-SHAPE 47X51 STRL (DRAPES) ×9 IMPLANT
DRSG AQUACEL AG ADV 3.5X10 (GAUZE/BANDAGES/DRESSINGS) ×3 IMPLANT
ELECT BLADE 4.0 EZ CLEAN MEGAD (MISCELLANEOUS) ×3
ELECT REM PT RETURN 9FT ADLT (ELECTROSURGICAL) ×3
ELECTRODE BLDE 4.0 EZ CLN MEGD (MISCELLANEOUS) ×1 IMPLANT
ELECTRODE REM PT RTRN 9FT ADLT (ELECTROSURGICAL) ×1 IMPLANT
GLOVE BIOGEL PI IND STRL 6 (GLOVE) ×1 IMPLANT
GLOVE BIOGEL PI IND STRL 7.0 (GLOVE) ×1 IMPLANT
GLOVE BIOGEL PI IND STRL 8 (GLOVE) ×1 IMPLANT
GLOVE BIOGEL PI IND STRL 8.5 (GLOVE) ×1 IMPLANT
GLOVE BIOGEL PI INDICATOR 6 (GLOVE) ×2
GLOVE BIOGEL PI INDICATOR 7.0 (GLOVE) ×2
GLOVE BIOGEL PI INDICATOR 8 (GLOVE) ×2
GLOVE BIOGEL PI INDICATOR 8.5 (GLOVE) ×2
GLOVE SURG SS PI 6.0 STRL IVOR (GLOVE) ×3 IMPLANT
GLOVE SURG SS PI 7.0 STRL IVOR (GLOVE) ×3 IMPLANT
GLOVE SURG SS PI 8.0 STRL IVOR (GLOVE) ×3 IMPLANT
GLOVE SURG SS PI 8.5 STRL IVOR (GLOVE) ×4
GLOVE SURG SS PI 8.5 STRL STRW (GLOVE) ×2 IMPLANT
GOWN STRL REUS W/ TWL LRG LVL3 (GOWN DISPOSABLE) ×2 IMPLANT
GOWN STRL REUS W/TWL 2XL LVL3 (GOWN DISPOSABLE) ×9 IMPLANT
GOWN STRL REUS W/TWL LRG LVL3 (GOWN DISPOSABLE) ×4
HANDPIECE INTERPULSE COAX TIP (DISPOSABLE) ×2
HOOD PEEL AWAY FACE SHEILD DIS (HOOD) ×6 IMPLANT
KIT BASIN OR (CUSTOM PROCEDURE TRAY) ×3 IMPLANT
KIT ROOM TURNOVER OR (KITS) ×3 IMPLANT
MANIFOLD NEPTUNE II (INSTRUMENTS) ×3 IMPLANT
MARKER SKIN DUAL TIP RULER LAB (MISCELLANEOUS) ×3 IMPLANT
NEEDLE SPNL 18GX3.5 QUINCKE PK (NEEDLE) ×3 IMPLANT
NS IRRIG 1000ML POUR BTL (IV SOLUTION) ×3 IMPLANT
PACK TOTAL JOINT (CUSTOM PROCEDURE TRAY) ×3 IMPLANT
PACK UNIVERSAL I (CUSTOM PROCEDURE TRAY) ×3 IMPLANT
PAD ARMBOARD 7.5X6 YLW CONV (MISCELLANEOUS) ×6 IMPLANT
SEALER BIPOLAR AQUA 6.0 (INSTRUMENTS) ×3 IMPLANT
SET HNDPC FAN SPRY TIP SCT (DISPOSABLE) ×1 IMPLANT
SUCTION FRAZIER HANDLE 10FR (MISCELLANEOUS) ×2
SUCTION TUBE FRAZIER 10FR DISP (MISCELLANEOUS) ×1 IMPLANT
SUT ETHIBOND NAB CT1 #1 30IN (SUTURE) ×6 IMPLANT
SUT MNCRL AB 3-0 PS2 18 (SUTURE) ×3 IMPLANT
SUT MON AB 2-0 CT1 36 (SUTURE) ×3 IMPLANT
SUT VIC AB 1 CT1 27 (SUTURE) ×2
SUT VIC AB 1 CT1 27XBRD ANBCTR (SUTURE) ×1 IMPLANT
SUT VIC AB 2-0 CT1 27 (SUTURE) ×2
SUT VIC AB 2-0 CT1 TAPERPNT 27 (SUTURE) ×1 IMPLANT
SUT VLOC 180 0 24IN GS25 (SUTURE) ×3 IMPLANT
SYR 50ML LL SCALE MARK (SYRINGE) ×3 IMPLANT
TOWEL OR 17X24 6PK STRL BLUE (TOWEL DISPOSABLE) ×3 IMPLANT
TOWEL OR 17X26 10 PK STRL BLUE (TOWEL DISPOSABLE) ×3 IMPLANT
WATER STERILE IRR 1000ML POUR (IV SOLUTION) ×9 IMPLANT
YANKAUER SUCT BULB TIP NO VENT (SUCTIONS) ×3 IMPLANT

## 2017-03-22 NOTE — H&P (View-Only) (Signed)
Reason for Consult:Rigth hip fx Referring Physician: Dirk Dress ED  Dawn Diaz is an 83 y.o. female.  HPI: Patient was brought to [facility] for right hip pain. She lives independently at [place]. Yesterday she was walking with her daughter at her daughters farm, when she slipped and fell. Reported immediate right hip pain. No other area of pain. Inability to bear weight on that right leg. She was evaluated at [facility] and X-rays reveled a subcapital right hip fracture. Ortho was consulted and she was admitted by medicine service. Right TKA by Dr.Aliusio in 2014. Denies any CP, SOB, or calf pain.   Past Medical History:  Diagnosis Date  . Anxiety disorder   . Bacterial overgrowth syndrome   . Breast cancer, stage 1 [facility])    '97/recurrent '05 right breast(s/p mastectomy)  . Bunion   . Depression   . Diverticulitis   . GERD (gastroesophageal reflux disease)   . Glaucoma   . Hiatal hernia   . History of TIAs    - 13 yrs ago"brief memory loss"  . Irritable bowel syndrome   . Osteoporosis   . Ovarian cyst     Past Surgical History:  Procedure Laterality Date  . ABDOMINAL HYSTERECTOMY    . BREAST BIOPSY     right  . BUNIONECTOMY Bilateral    both, repeat x1  . CATARACT EXTRACTION Left   . MASTECTOMY  1997   Right  . TONSILLECTOMY    . TOTAL KNEE ARTHROPLASTY Right 04/15/2013   Procedure: RIGHT TOTAL KNEE ARTHROPLASTY;  Surgeon: Gearlean Alf, MD;  Location: [facility] ORS;  Service: Orthopedics;  Laterality: Right;    Family History  Problem Relation Age of Onset  . Colon cancer Sister 64       Died at 33  . Kidney cancer Father 74  . Pancreatic cancer Mother 19  . Pancreatic cancer Maternal Grandmother 59  . Cancer Paternal Grandfather        died from "abdominal ca" at young age  . Breast cancer Neg Hx     Social History:  reports that she quit smoking about 65 years ago. Her smoking use included Cigarettes. She quit after 3.00 years of use. She has never used smokeless tobacco. She  reports that she does not drink alcohol or use drugs.  Allergies:  Allergies  Allergen Reactions  . Amoxicillin Hives  . Latex Itching    Medications: I have reviewed the patient's current medications.  Results for orders placed or performed during the hospital encounter of 03/21/17 (from the past 48 hour(s))  CBC WITH DIFFERENTIAL     Status: None   Collection Time: 03/21/17  9:13 PM  Result Value Ref Range   WBC 6.2 4.0 - 10.5 K/uL   RBC 4.55 3.87 - 5.11 MIL/uL   Hemoglobin 13.0 12.0 - 15.0 g/dL   HCT 39.6 36.0 - 46.0 %   MCV 87.0 78.0 - 100.0 fL   MCH 28.6 26.0 - 34.0 pg   MCHC 32.8 30.0 - 36.0 g/dL   RDW 13.3 11.5 - 15.5 %   Platelets 211 150 - 400 K/uL   Neutrophils Relative % 68 %   Neutro Abs 4.3 1.7 - 7.7 K/uL   Lymphocytes Relative 19 %   Lymphs Abs 1.2 0.7 - 4.0 K/uL   Monocytes Relative 10 %   Monocytes Absolute 0.6 0.1 - 1.0 K/uL   Eosinophils Relative 2 %   Eosinophils Absolute 0.1 0.0 - 0.7 K/uL   Basophils Relative  1 %   Basophils Absolute 0.1 0.0 - 0.1 K/uL  Protime-INR     Status: None   Collection Time: 03/21/17  9:13 PM  Result Value Ref Range   Prothrombin Time 13.3 11.4 - 15.2 seconds   INR 1.02   Type and screen [place]     Status: None   Collection Time: 03/21/17  9:13 PM  Result Value Ref Range   ABO/RH(D) A POS    Antibody Screen NEG    Sample Expiration 32/67/1245   Basic metabolic panel     Status: Abnormal   Collection Time: 03/21/17  9:13 PM  Result Value Ref Range   Sodium 138 135 - 145 mmol/L   Potassium 3.9 3.5 - 5.1 mmol/L   Chloride 104 101 - 111 mmol/L   CO2 25 22 - 32 mmol/L   Glucose, Bld 112 (H) 65 - 99 mg/dL   BUN 24 (H) 6 - 20 mg/dL   Creatinine, Ser 0.72 0.44 - 1.00 mg/dL   Calcium 8.6 (L) 8.9 - 10.3 mg/dL   GFR calc non Af Amer >60 >60 mL/min   GFR calc Af Amer >60 >60 mL/min    Comment: (NOTE) The eGFR has been calculated using the CKD EPI equation. This calculation has not been validated in  all clinical situations. eGFR's persistently <60 mL/min signify possible Chronic Kidney Disease.    Anion gap 9 5 - 15  Surgical pcr screen     Status: None   Collection Time: 03/22/17 12:45 AM  Result Value Ref Range   MRSA, PCR NEGATIVE NEGATIVE   Staphylococcus aureus NEGATIVE NEGATIVE    Comment: (NOTE) The Xpert SA Assay (FDA approved for NASAL specimens in patients 58 years of age and older), is one component of a comprehensive surveillance program. It is not intended to diagnose infection nor to guide or monitor treatment.   CBC     Status: None   Collection Time: 03/22/17  5:32 AM  Result Value Ref Range   WBC 7.9 4.0 - 10.5 K/uL   RBC 4.49 3.87 - 5.11 MIL/uL   Hemoglobin 13.0 12.0 - 15.0 g/dL   HCT 39.8 36.0 - 46.0 %   MCV 88.6 78.0 - 100.0 fL   MCH 29.0 26.0 - 34.0 pg   MCHC 32.7 30.0 - 36.0 g/dL   RDW 13.4 11.5 - 15.5 %   Platelets 194 150 - 400 K/uL  Basic metabolic panel     Status: Abnormal   Collection Time: 03/22/17  5:32 AM  Result Value Ref Range   Sodium 138 135 - 145 mmol/L   Potassium 3.7 3.5 - 5.1 mmol/L   Chloride 104 101 - 111 mmol/L   CO2 26 22 - 32 mmol/L   Glucose, Bld 134 (H) 65 - 99 mg/dL   BUN 20 6 - 20 mg/dL   Creatinine, Ser 0.61 0.44 - 1.00 mg/dL   Calcium 8.5 (L) 8.9 - 10.3 mg/dL   GFR calc non Af Amer >60 >60 mL/min   GFR calc Af Amer >60 >60 mL/min    Comment: (NOTE) The eGFR has been calculated using the CKD EPI equation. This calculation has not been validated in all clinical situations. eGFR's persistently <60 mL/min signify possible Chronic Kidney Disease.    Anion gap 8 5 - 15    Ct Hip Right Wo Contrast  Result Date: 03/21/2017 CLINICAL DATA:  Fall onto right hip while walking with displaced fracture on x-rays. Possible acetabular fracture. EXAM:  CT OF THE RIGHT HIP WITHOUT CONTRAST TECHNIQUE: Multidetector CT imaging of the right hip was performed according to the standard protocol. Multiplanar CT image reconstructions  were also generated. COMPARISON:  Plain films earlier today.  CT 09/05/2011 FINDINGS: Bones/Joint/Cartilage Examination demonstrates evidence of patient's displaced subcapital right femoral neck fracture. There is no evidence of a mid acetabular fracture. No other fractures identified. Mild degenerative change of the right hip. Ligaments Suboptimally assessed by CT. Muscles and Tendons Unremarkable. Soft tissues Mild calcified plaque over they right external iliac artery. Diverticulosis of the colon. IMPRESSION: Displaced subcapital fracture of the right femoral neck. No acetabular fracture. Electronically Signed   By: Marin Olp M.D.   On: 03/21/2017 22:29   Dg Hip Unilat  With Pelvis 2-3 Views Right  Result Date: 03/21/2017 CLINICAL DATA:  Right hip pain post fall today. EXAM: DG HIP (WITH OR WITHOUT PELVIS) 2-3V RIGHT COMPARISON:  None. FINDINGS: Diffuse osteopenia. Mild symmetric degenerative change of the hips. Displaced subcapital/transcervical fracture of the right femoral neck. Moderate degenerate change of the spine. IMPRESSION: Displaced right femoral neck fracture. Electronically Signed   By: Marin Olp M.D.   On: 03/21/2017 21:21   Dg Femur 1v Right  Result Date: 03/21/2017 CLINICAL DATA:  Fall today with right hip pain. EXAM: RIGHT FEMUR 1 VIEW COMPARISON:  None. FINDINGS: Examination demonstrates diffuse osteopenia. There mild degenerative changes of the right hip. There is a moderately displaced subcapital/transcervical fracture of the right femoral neck. Minimal focal irregularity over the mid acetabulum along the pelvic rim as would be difficult to exclude a fracture in this location as this was not appreciated on patient's right hip films. Evidence of previous right knee replacement with hardware intact. IMPRESSION: Displaced right femoral neck fracture. Cannot completely exclude a subtle fracture of the right mid acetabulum. Electronically Signed   By: Marin Olp M.D.   On:  03/21/2017 21:23    Review of Systems  Constitutional: Negative.   HENT: Negative.   Eyes: Negative.   Respiratory: Negative.   Cardiovascular: Negative.   Gastrointestinal: Negative.   Genitourinary: Negative.   Musculoskeletal: Positive for joint pain.  Skin: Negative.   Neurological: Negative.   Endo/Heme/Allergies: Negative.   Psychiatric/Behavioral: Negative.    Blood pressure (!) 133/54, pulse 80, temperature 99 F (37.2 C), temperature source Oral, resp. rate 16, height 5' 7" (1.702 m), weight 61 kg (134 lb 7.7 oz), SpO2 92 %. Physical Exam  Constitutional: She is oriented to person, place, and time. She appears well-developed.  HENT:  Head: Normocephalic.  Eyes: EOM are normal.  Neck: Normal range of motion.  Cardiovascular: Normal rate and intact distal pulses.   Respiratory: Effort normal.  GI: Soft.  Genitourinary:  Genitourinary Comments: Deferred  Musculoskeletal:  Right hip pain. Limited rom of hip. TKA knee incision. Right LE grossly n/v intact. Plantar and dorsi flexion intact.   Neurological: She is alert and oriented to person, place, and time.  Skin: Skin is warm and dry.  Psychiatric: Her behavior is normal.    Assessment/Plan: Right hip fracture: Plan to possible transfer to [facility] for surgery vs [facility] Consent for Right hip hemiarthroplasty NPO Admitted to medicine Hold any anti coagulates, ASA is ok Ct negative for acetabulum fracture   ,  L 03/22/2017, 7:48 AM

## 2017-03-22 NOTE — Anesthesia Preprocedure Evaluation (Addendum)
Anesthesia Evaluation  Patient identified by MRN, date of birth, ID band Patient awake    Reviewed: Allergy & Precautions, H&P , NPO status , Patient's Chart, lab work & pertinent test results  Airway Mallampati: II   Neck ROM: full    Dental  (+) Teeth Intact   Pulmonary former smoker,    breath sounds clear to auscultation       Cardiovascular negative cardio ROS   Rhythm:regular Rate:Normal     Neuro/Psych PSYCHIATRIC DISORDERS Anxiety Depression  Neuromuscular disease    GI/Hepatic hiatal hernia, GERD  ,  Endo/Other    Renal/GU      Musculoskeletal  (+) Arthritis ,   Abdominal   Peds  Hematology   Anesthesia Other Findings   Reproductive/Obstetrics                            Anesthesia Physical Anesthesia Plan  ASA: II  Anesthesia Plan: General   Post-op Pain Management:    Induction: Intravenous  PONV Risk Score and Plan: 3 and Ondansetron and Dexamethasone  Airway Management Planned: Oral ETT  Additional Equipment:   Intra-op Plan:   Post-operative Plan: Extubation in OR  Informed Consent: I have reviewed the patients History and Physical, chart, labs and discussed the procedure including the risks, benefits and alternatives for the proposed anesthesia with the patient or authorized representative who has indicated his/her understanding and acceptance.     Plan Discussed with: CRNA, Anesthesiologist and Surgeon  Anesthesia Plan Comments:         Anesthesia Quick Evaluation

## 2017-03-22 NOTE — Progress Notes (Signed)
LCSW called to assist with transport to [facility] was unable to provide transport for unknown reasons.  LCSW completed medical necessity and packet for RN.  RN has already spoken to [facility] who will be picking up patient this morning.  Lane Hacker, MSW Clinical Social Work: [employer]maker Coverage for :  [phone]

## 2017-03-22 NOTE — Discharge Instructions (Signed)
°Dr. Jalaya Sarver °Joint Replacement Specialist °[facility] °[address] °[place], [place] [address] °[phone] ° ° °TOTAL HIP REPLACEMENT POSTOPERATIVE DIRECTIONS ° ° ° °Hip Rehabilitation, Guidelines Following Surgery  ° °WEIGHT BEARING °Weight bearing as tolerated with assist device (walker, cane, etc) as directed, use it as long as suggested by your surgeon or therapist, typically at least 4-6 weeks. ° °The results of a hip operation are greatly improved after range of motion and muscle strengthening exercises. Follow all safety measures which are given to protect your hip. If any of these exercises cause increased pain or swelling in your joint, decrease the amount until you are comfortable again. Then slowly increase the exercises. Call your caregiver if you have problems or questions.  ° °HOME CARE INSTRUCTIONS  °Most of the following instructions are designed to prevent the dislocation of your new hip.  °Remove items at home which could result in a fall. This includes throw rugs or furniture in walking pathways.  °Continue medications as instructed at time of discharge. °· You may have some home medications which will be placed on hold until you complete the course of blood thinner medication. °· You may start showering once you are discharged home. Do not remove your dressing. °Do not put on socks or shoes without following the instructions of your caregivers.   °Sit on chairs with arms. Use the chair arms to help push yourself up when arising.  °Arrange for the use of a toilet seat elevator so you are not sitting low.  °· Walk with walker as instructed.  °You may resume a sexual relationship in one month or when given the OK by your caregiver.  °Use walker as long as suggested by your caregivers.  °You may put full weight on your legs and walk as much as is comfortable. °Avoid periods of inactivity such as sitting longer than an hour when not asleep. This helps prevent  blood clots.  °You may return to work once you are cleared by your surgeon.  °Do not drive a car for 6 weeks or until released by your surgeon.  °Do not drive while taking narcotics.  °Wear elastic stockings for two weeks following surgery during the day but you may remove then at night.  °Make sure you keep all of your appointments after your operation with all of your doctors and caregivers. You should call the office at the above phone number and make an appointment for approximately two weeks after the date of your surgery. °Please pick up a stool softener and laxative for home use as long as you are requiring pain medications. °· ICE to the affected hip every three hours for 30 minutes at a time and then as needed for pain and swelling. Continue to use ice on the hip for pain and swelling from surgery. You may notice swelling that will progress down to the foot and ankle.  This is normal after surgery.  Elevate the leg when you are not up walking on it.   °It is important for you to complete the blood thinner medication as prescribed by your doctor. °· Continue to use the breathing machine which will help keep your temperature down.  It is common for your temperature to cycle up and down following surgery, especially at night when you are not up moving around and exerting yourself.  The breathing machine keeps your lungs expanded and your temperature down. ° °RANGE OF MOTION AND STRENGTHENING EXERCISES  °These exercises are   designed to help you keep full movement of your hip joint. Follow your caregiver's or physical therapist's instructions. Perform all exercises about fifteen times, three times per day or as directed. Exercise both hips, even if you have had only one joint replacement. These exercises can be done on a training (exercise) mat, on the floor, on a table or on a bed. Use whatever works the best and is most comfortable for you. Use music or television while you are exercising so that the exercises  are a pleasant break in your day. This will make your life better with the exercises acting as a break in routine you can look forward to.  °Lying on your back, slowly slide your foot toward your buttocks, raising your knee up off the floor. Then slowly slide your foot back down until your leg is straight again.  °Lying on your back spread your legs as far apart as you can without causing discomfort.  °Lying on your side, raise your upper leg and foot straight up from the floor as far as is comfortable. Slowly lower the leg and repeat.  °Lying on your back, tighten up the muscle in the front of your thigh (quadriceps muscles). You can do this by keeping your leg straight and trying to raise your heel off the floor. This helps strengthen the largest muscle supporting your knee.  °Lying on your back, tighten up the muscles of your buttocks both with the legs straight and with the knee bent at a comfortable angle while keeping your heel on the floor.  ° °SKILLED REHAB INSTRUCTIONS: °If the patient is transferred to a skilled rehab facility following release from the hospital, a list of the current medications will be sent to the facility for the patient to continue.  When discharged from the skilled rehab facility, please have the facility set up the patient's Home Health Physical Therapy prior to being released. Also, the skilled facility will be responsible for providing the patient with their medications at time of release from the facility to include their pain medication and their blood thinner medication. If the patient is still at the rehab facility at time of the two week follow up appointment, the skilled rehab facility will also need to assist the patient in arranging follow up appointment in our office and any transportation needs. ° °MAKE SURE YOU:  °Understand these instructions.  °Will watch your condition.  °Will get help right away if you are not doing well or get worse. ° °Pick up stool softner and  laxative for home use following surgery while on pain medications. °Do not remove your dressing. °The dressing is waterproof--it is OK to take showers. °Continue to use ice for pain and swelling after surgery. °Do not use any lotions or creams on the incision until instructed by your surgeon. °Total Hip Protocol. ° ° °

## 2017-03-22 NOTE — Progress Notes (Signed)
Initial Nutrition Assessment  DOCUMENTATION CODES:   Not applicable  INTERVENTION:   - Monitor pt for diet advancement  - Recommend Ensure Enlive BID when applicable to assist with healing post surgery. Each supplement provides 350 kcals and 20 grams protein.  NUTRITION DIAGNOSIS:   Increased nutrient needs related to acute illness (hip fracture surgery) as evidenced by estimated needs.  GOAL:   Patient will meet greater than or equal to 90% of their needs  MONITOR:   Diet advancement, PO intake, Supplement acceptance, Weight trends, Labs  REASON FOR ASSESSMENT:   Consult Hip fracture protocol  ASSESSMENT:   83 year old female admitted for right hip fracture. PMH of seronegative rheumatoid arthritis, breast cancer s/p mastectomy, and osteoporosis.  Spoke with pts daughter on the phone. She reported pt having a good appetite, good PO intake, and no n/v. She said that her mother is conscious of her weight and watches what she eats. Pt normally has two meals per day. She reports that her mother takes a Vitamin D supplement most days, takes a probiotic, and does not take a MVI. She states that her mother has appeared thinner to her lately. Per weight records in chart pts weight has been stable for past 5 years (ranging from 133-136 lbs).   Medications: Reviewed  Labs: Glucose 134 (H), Ca 8.5 (L)  Unable to perform physical exam as pt transferred to [facility] for surgery.  Diet Order:  Diet NPO time specified Except for: Sips with Meds Diet NPO time specified  Skin:  Reviewed, no issues (right hip incision)  Last BM:  03/20/17  Height:   Ht Readings from Last 1 Encounters:  03/22/17 5\' 7"  (1.702 m)    Weight:   Wt Readings from Last 1 Encounters:  03/22/17 134 lb 7.7 oz (61 kg)    Ideal Body Weight:  61.4 kg  BMI:  Body mass index is 21.06 kg/m.  Estimated Nutritional Needs:   Kcal:  1200-1400 kcals  Protein:  65-75 grams protein  Fluid:  >/= 2  L  EDUCATION NEEDS:   No education needs identified at this time  [place] Intern Pager: [phone] 03/22/2017 1:27 PM

## 2017-03-22 NOTE — Interval H&P Note (Signed)
History and Physical Interval Note:  03/22/2017 11:28 AM  Dawn Diaz  has presented today for surgery, with the diagnosis of Right Femoral Neck Fracture   The various methods of treatment have been discussed with the patient and family. After consideration of risks, benefits and other options for treatment, the patient has consented to  Procedure(s): ANTERIOR APPROACH HEMI HIP ARTHROPLASTY (Right) as a surgical intervention .  The patient's history has been reviewed, patient examined, no change in status, stable for surgery.  I have reviewed the patient's chart and labs.  Questions were answered to the patient's satisfaction.     Katniss Weedman, Horald Pollen

## 2017-03-22 NOTE — Consult Note (Signed)
Reason for Consult:Rigth hip fx Referring Physician: Dirk Dress [facility]  Dawn Diaz is an 83 y.o. female.  HPI: Patient was brought to [facility] for right hip pain. She lives independently at [place]. Yesterday she was walking with her daughter at her daughters farm, when she slipped and fell. Reported immediate right hip pain. No other area of pain. Inability to bear weight on that right leg. She was evaluated at [facility] and X-rays reveled a subcapital right hip fracture. Ortho was consulted and she was admitted by medicine service. Right TKA by Dr.Aliusio in 2014. Denies any CP, SOB, or calf pain.   Past Medical History:  Diagnosis Date  . Anxiety disorder   . Bacterial overgrowth syndrome   . Breast cancer, stage 1 [facility])    '97/recurrent '05 right breast(s/p mastectomy)  . Bunion   . Depression   . Diverticulitis   . GERD (gastroesophageal reflux disease)   . Glaucoma   . Hiatal hernia   . History of TIAs    - 13 yrs ago"brief memory loss"  . Irritable bowel syndrome   . Osteoporosis   . Ovarian cyst     Past Surgical History:  Procedure Laterality Date  . ABDOMINAL HYSTERECTOMY    . BREAST BIOPSY     right  . BUNIONECTOMY Bilateral    both, repeat x1  . CATARACT EXTRACTION Left   . MASTECTOMY  1997   Right  . TONSILLECTOMY    . TOTAL KNEE ARTHROPLASTY Right 04/15/2013   Procedure: RIGHT TOTAL KNEE ARTHROPLASTY;  Surgeon: Gearlean Alf, MD;  Location: [facility]S;  Service: Orthopedics;  Laterality: Right;    Family History  Problem Relation Age of Onset  . Colon cancer Sister 56       Died at 13  . Kidney cancer Father 43  . Pancreatic cancer Mother 70  . Pancreatic cancer Maternal Grandmother 14  . Cancer Paternal Grandfather        died from "abdominal ca" at young age  . Breast cancer Neg Hx     Social History:  reports that she quit smoking about 65 years ago. Her smoking use included Cigarettes. She quit after 3.00 years of use. She has never used smokeless tobacco. She  reports that she does not drink alcohol or use drugs.  Allergies:  Allergies  Allergen Reactions  . Amoxicillin Hives  . Latex Itching    Medications: I have reviewed the patient's current medications.  Results for orders placed or performed during the hospital encounter of 03/21/17 (from the past 48 hour(s))  CBC WITH DIFFERENTIAL     Status: None   Collection Time: 03/21/17  9:13 PM  Result Value Ref Range   WBC 6.2 4.0 - 10.5 K/uL   RBC 4.55 3.87 - 5.11 MIL/uL   Hemoglobin 13.0 12.0 - 15.0 g/dL   HCT 39.6 36.0 - 46.0 %   MCV 87.0 78.0 - 100.0 fL   MCH 28.6 26.0 - 34.0 pg   MCHC 32.8 30.0 - 36.0 g/dL   RDW 13.3 11.5 - 15.5 %   Platelets 211 150 - 400 K/uL   Neutrophils Relative % 68 %   Neutro Abs 4.3 1.7 - 7.7 K/uL   Lymphocytes Relative 19 %   Lymphs Abs 1.2 0.7 - 4.0 K/uL   Monocytes Relative 10 %   Monocytes Absolute 0.6 0.1 - 1.0 K/uL   Eosinophils Relative 2 %   Eosinophils Absolute 0.1 0.0 - 0.7 K/uL   Basophils Relative  1 %   Basophils Absolute 0.1 0.0 - 0.1 K/uL  Protime-INR     Status: None   Collection Time: 03/21/17  9:13 PM  Result Value Ref Range   Prothrombin Time 13.3 11.4 - 15.2 seconds   INR 1.02   Type and screen [facility]     Status: None   Collection Time: 03/21/17  9:13 PM  Result Value Ref Range   ABO/RH(D) A POS    Antibody Screen NEG    Sample Expiration 03/24/2017   Basic metabolic panel     Status: Abnormal   Collection Time: 03/21/17  9:13 PM  Result Value Ref Range   Sodium 138 135 - 145 mmol/L   Potassium 3.9 3.5 - 5.1 mmol/L   Chloride 104 101 - 111 mmol/L   CO2 25 22 - 32 mmol/L   Glucose, Bld 112 (H) 65 - 99 mg/dL   BUN 24 (H) 6 - 20 mg/dL   Creatinine, Ser 0.72 0.44 - 1.00 mg/dL   Calcium 8.6 (L) 8.9 - 10.3 mg/dL   GFR calc non Af Amer >60 >60 mL/min   GFR calc Af Amer >60 >60 mL/min    Comment: (NOTE) The eGFR has been calculated using the CKD EPI equation. This calculation has not been validated in  all clinical situations. eGFR's persistently <60 mL/min signify possible Chronic Kidney Disease.    Anion gap 9 5 - 15  Surgical pcr screen     Status: None   Collection Time: 03/22/17 12:45 AM  Result Value Ref Range   MRSA, PCR NEGATIVE NEGATIVE   Staphylococcus aureus NEGATIVE NEGATIVE    Comment: (NOTE) The Xpert SA Assay (FDA approved for NASAL specimens in patients 22 years of age and older), is one component of a comprehensive surveillance program. It is not intended to diagnose infection nor to guide or monitor treatment.   CBC     Status: None   Collection Time: 03/22/17  5:32 AM  Result Value Ref Range   WBC 7.9 4.0 - 10.5 K/uL   RBC 4.49 3.87 - 5.11 MIL/uL   Hemoglobin 13.0 12.0 - 15.0 g/dL   HCT 39.8 36.0 - 46.0 %   MCV 88.6 78.0 - 100.0 fL   MCH 29.0 26.0 - 34.0 pg   MCHC 32.7 30.0 - 36.0 g/dL   RDW 13.4 11.5 - 15.5 %   Platelets 194 150 - 400 K/uL  Basic metabolic panel     Status: Abnormal   Collection Time: 03/22/17  5:32 AM  Result Value Ref Range   Sodium 138 135 - 145 mmol/L   Potassium 3.7 3.5 - 5.1 mmol/L   Chloride 104 101 - 111 mmol/L   CO2 26 22 - 32 mmol/L   Glucose, Bld 134 (H) 65 - 99 mg/dL   BUN 20 6 - 20 mg/dL   Creatinine, Ser 0.61 0.44 - 1.00 mg/dL   Calcium 8.5 (L) 8.9 - 10.3 mg/dL   GFR calc non Af Amer >60 >60 mL/min   GFR calc Af Amer >60 >60 mL/min    Comment: (NOTE) The eGFR has been calculated using the CKD EPI equation. This calculation has not been validated in all clinical situations. eGFR's persistently <60 mL/min signify possible Chronic Kidney Disease.    Anion gap 8 5 - 15    Ct Hip Right Wo Contrast  Result Date: 03/21/2017 CLINICAL DATA:  Fall onto right hip while walking with displaced fracture on x-rays. Possible acetabular fracture. EXAM:   CT OF THE RIGHT HIP WITHOUT CONTRAST TECHNIQUE: Multidetector CT imaging of the right hip was performed according to the standard protocol. Multiplanar CT image reconstructions  were also generated. COMPARISON:  Plain films earlier today.  CT 09/05/2011 FINDINGS: Bones/Joint/Cartilage Examination demonstrates evidence of patient's displaced subcapital right femoral neck fracture. There is no evidence of a mid acetabular fracture. No other fractures identified. Mild degenerative change of the right hip. Ligaments Suboptimally assessed by CT. Muscles and Tendons Unremarkable. Soft tissues Mild calcified plaque over they right external iliac artery. Diverticulosis of the colon. IMPRESSION: Displaced subcapital fracture of the right femoral neck. No acetabular fracture. Electronically Signed   By: Daniel  Boyle M.D.   On: 03/21/2017 22:29   Dg Hip Unilat  With Pelvis 2-3 Views Right  Result Date: 03/21/2017 CLINICAL DATA:  Right hip pain post fall today. EXAM: DG HIP (WITH OR WITHOUT PELVIS) 2-3V RIGHT COMPARISON:  None. FINDINGS: Diffuse osteopenia. Mild symmetric degenerative change of the hips. Displaced subcapital/transcervical fracture of the right femoral neck. Moderate degenerate change of the spine. IMPRESSION: Displaced right femoral neck fracture. Electronically Signed   By: Daniel  Boyle M.D.   On: 03/21/2017 21:21   Dg Femur 1v Right  Result Date: 03/21/2017 CLINICAL DATA:  Fall today with right hip pain. EXAM: RIGHT FEMUR 1 VIEW COMPARISON:  None. FINDINGS: Examination demonstrates diffuse osteopenia. There mild degenerative changes of the right hip. There is a moderately displaced subcapital/transcervical fracture of the right femoral neck. Minimal focal irregularity over the mid acetabulum along the pelvic rim as would be difficult to exclude a fracture in this location as this was not appreciated on patient's right hip films. Evidence of previous right knee replacement with hardware intact. IMPRESSION: Displaced right femoral neck fracture. Cannot completely exclude a subtle fracture of the right mid acetabulum. Electronically Signed   By: Daniel  Boyle M.D.   On:  03/21/2017 21:23    Review of Systems  Constitutional: Negative.   HENT: Negative.   Eyes: Negative.   Respiratory: Negative.   Cardiovascular: Negative.   Gastrointestinal: Negative.   Genitourinary: Negative.   Musculoskeletal: Positive for joint pain.  Skin: Negative.   Neurological: Negative.   Endo/Heme/Allergies: Negative.   Psychiatric/Behavioral: Negative.    Blood pressure (!) 133/54, pulse 80, temperature 99 F (37.2 C), temperature source Oral, resp. rate 16, height 5' 7" (1.702 m), weight 61 kg (134 lb 7.7 oz), SpO2 92 %. Physical Exam  Constitutional: She is oriented to person, place, and time. She appears well-developed.  HENT:  Head: Normocephalic.  Eyes: EOM are normal.  Neck: Normal range of motion.  Cardiovascular: Normal rate and intact distal pulses.   Respiratory: Effort normal.  GI: Soft.  Genitourinary:  Genitourinary Comments: Deferred  Musculoskeletal:  Right hip pain. Limited rom of hip. TKA knee incision. Right LE grossly n/v intact. Plantar and dorsi flexion intact.   Neurological: She is alert and oriented to person, place, and time.  Skin: Skin is warm and dry.  Psychiatric: Her behavior is normal.    Assessment/Plan: Right hip fracture: Plan to possible transfer to [facility] for surgery vs [facility] Consent for Right hip hemiarthroplasty NPO Admitted to medicine Hold any anti coagulates, ASA is ok Ct negative for acetabulum fracture   Kayode Petion L 03/22/2017, 7:48 AM     

## 2017-03-22 NOTE — Op Note (Signed)
OPERATIVE REPORT  SURGEON: Rod Can, MD   ASSISTANT: Ky Barban, RNFA.  PREOPERATIVE DIAGNOSIS: Displaced Right femoral neck fracture.   POSTOPERATIVE DIAGNOSIS: Displaced Right femoral neck fracture.   PROCEDURE: Right hip hemiarthroplasty, anterior approach.   IMPLANTS: DePuy Tri Lock stem, size 7, std offset, with a -3 mm spacer and a 49 mm monopolar head ball.  ANESTHESIA:  General  ANTIBIOTICS: 900 mg clindamycin (PCN allergy).  ESTIMATED BLOOD LOSS: 250 mL.    DRAINS: None.  COMPLICATIONS: None   CONDITION: PACU - hemodynamically stable.   BRIEF CLINICAL NOTE: Dawn Diaz is a 83 y.o. female with a displaced Right femoral neck fracture. The patient was admitted to the hospitalist service and underwent perioperative risk stratification and medical optimization. The risks, benefits, and alternatives to hemiarthroplasty were explained, and the patient elected to proceed.  PROCEDURE IN DETAIL: The patient was taken to the operating room and general anesthesia was induced on the hospital bed. The patient was then positioned on the Hana table. All bony prominences were well padded. The hip was prepped and draped in the normal sterile surgical fashion. A time-out was called verifying side and site of surgery. Antibiotics were given within 60 minutes of beginning the procedure.  The direct anterior approach to the hip was performed through the Hueter interval. Lateral femoral circumflex vessels were treated with the Auqumantys. The anterior capsule was exposed and an inverted T capsulotomy was made. Fracture hematoma was encountered and evacuated. The patient was found to have a comminuted Right subcapital femoral neck fracture. I freshened the femoral neck cut with a saw. I removed the femoral neck fragment. A corkscrew was placed into the head and the head was removed. This was passed to the back table and was measured.  Acetabular exposure was  achieved. I examined the articular cartilage which was intact. The labrum was intact. A 49 mm trial head was placed and found to have excellent fit.  I then gained femoral exposure taking care to protect the abductors and greater trochanter. This was performed using standard external rotation, extension, and adduction. The capsule was peeled off the inner aspect of the greater trochanter, taking care to preserve the short external rotators. A cookie cutter was used to enter the femoral canal, and then the femoral canal finder was used to confirm location. I then sequentially broached up to a size 7. Calcar planer was used on the femoral neck remnant. I paced a std neck and a 36+ 1.5 head ball.The hip was reduced. Leg lengths were checked fluoroscopically. The hip was dislocated and trial components were removed. I placed the real stem followed by the real spacer and head ball. A single reduction maneuver was performed and the hip was reduced. Fluoroscopy was used to confirm component position and leg lengths. At 90 degrees of external rotation and extension, the hip was stable to an anterior directed force.  The wound was copiously irrigated with normal saline solution. Marcaine solution was injected into the periarticular soft tissue. The wound was closed in layers using #1 Vicryl and V-Loc for the fascia, 2-0 Vicryl for the subcutaneous fat, 2-0 Monocryl for the deep dermal layer, 3-0 running Monocryl subcuticular stitch and glue for the skin. Once the glue was fully dried, an Aquacell Ag dressing was applied. The patient was then awakened from anesthesia and transported to the recovery room in stable condition. Sponge, needle, and instrument counts were correct at the end of the case x2. The patient tolerated the procedure  well and there were no known complications.  Postoperatively, we will readmit the patient to the hospitalist service. She may weight-bear as tolerated with a walker.  We'll start her on aspirin 81 mg by mouth twice a day for DVT prophylaxis for 6 weeks. She'll work with physical and occupational therapy. She'll undergo disposition planning.

## 2017-03-22 NOTE — Transfer of Care (Signed)
Immediate Anesthesia Transfer of Care Note  Patient: Dawn Diaz  Procedure(s) Performed: ANTERIOR APPROACH HEMI HIP ARTHROPLASTY (Right ) ANTERIOR APPROACH HEMI HIP ARTHROPLASTY (Right )  Patient Location: PACU  Anesthesia Type:General  Level of Consciousness: awake, alert  and oriented  Airway & Oxygen Therapy: Patient Spontanous Breathing and Patient connected to nasal cannula oxygen  Post-op Assessment: Report given to RN, Post -op Vital signs reviewed and stable and Patient moving all extremities  Post vital signs: Reviewed and stable  Last Vitals:  Vitals:   03/22/17 0040 03/22/17 0553  BP: (!) 177/65 (!) 133/54  Pulse: 78 80  Resp: 18 16  Temp: 36.8 C 37.2 C  SpO2: 93% 92%    Last Pain:  Vitals:   03/22/17 0649  TempSrc:   PainSc: 8       Patients Stated Pain Goal: 2 (15/94/58 5929)  Complications: No apparent anesthesia complications

## 2017-03-22 NOTE — Anesthesia Procedure Notes (Signed)
Procedure Name: Intubation Date/Time: 03/22/2017 11:49 AM Performed by: Kyung Rudd Pre-anesthesia Checklist: Patient identified, Emergency Drugs available, Suction available and Patient being monitored Patient Re-evaluated:Patient Re-evaluated prior to induction Oxygen Delivery Method: Circle system utilized Preoxygenation: Pre-oxygenation with 100% oxygen Induction Type: IV induction Ventilation: Mask ventilation without difficulty Laryngoscope Size: Mac and 3 Grade View: Grade I Tube type: Oral Tube size: 7.0 mm Number of attempts: 1 Airway Equipment and Method: Stylet Placement Confirmation: ETT inserted through vocal cords under direct vision,  positive ETCO2 and breath sounds checked- equal and bilateral Secured at: 21 cm Tube secured with: Tape Dental Injury: Teeth and Oropharynx as per pre-operative assessment

## 2017-03-22 NOTE — Progress Notes (Signed)
[facility] PROGRESS NOTE  Dawn Diaz XKG:[id number] DOB: [birth year] DOA: 03/21/2017  PCP: Shon Baton, MD  Brief History/Interval Summary: 83 year old Caucasian female with a past medical history of seronegative rheumatoid arthritis on hydroxychloroquine, history of breast cancer status post mastectomy, presented after sustaining a mechanical fall. She was found to have right hip fracture. She was admitted for further management.  Reason for Visit: Right hip fracture  Consultants: Orthopedics  Procedures: None yet  Antibiotics: None  Subjective/Interval History: Patient feels well. Denies any significant pain in her right hip area. Denies any chest pain, shortness of breath, nausea or vomiting.  ROS: No headaches  Objective:  Vital Signs  Vitals:   03/21/17 2053 03/21/17 2230 03/22/17 0040 03/22/17 0553  BP: (!) 171/75 (!) 151/85 (!) 177/65 (!) 133/54  Pulse: 72 82 78 80  Resp: 19 19 18 16   Temp: (!) 97.5 F (36.4 C)  98.3 F (36.8 C) 99 F (37.2 C)  TempSrc: Oral  Oral Oral  SpO2: 98% 96% 93% 92%  Weight:   61 kg (134 lb 7.7 oz)   Height:   5\' 7"  (1.702 m)     Intake/Output Summary (Last 24 hours) at 03/22/17 1143 Last data filed at 03/22/17 0133  Gross per 24 hour  Intake                0 ml  Output              150 ml  Net             -150 ml   Filed Weights   03/22/17 0040  Weight: 61 kg (134 lb 7.7 oz)    General appearance: alert, cooperative, appears stated age and no distress Head: Normocephalic, without obvious abnormality, atraumatic Resp: clear to auscultation bilaterally Cardio: regular rate and rhythm, S1, S2 normal, no murmur, click, rub or gallop GI: soft, non-tender; bowel sounds normal; no masses,  no organomegaly Extremities: Right lower extremity is externally rotated. Neurologic: She is awake, alert. Oriented 3. No focal neurological deficits.  Lab Results:  Data Reviewed: I have personally reviewed following labs and  imaging studies  CBC:  Recent Labs Lab 03/21/17 2113 03/22/17 0532  WBC 6.2 7.9  NEUTROABS 4.3  --   HGB 13.0 13.0  HCT 39.6 39.8  MCV 87.0 88.6  PLT 211 702    Basic Metabolic Panel:  Recent Labs Lab 03/21/17 2113 03/22/17 0532  NA 138 138  K 3.9 3.7  CL 104 104  CO2 25 26  GLUCOSE 112* 134*  BUN 24* 20  CREATININE 0.72 0.61  CALCIUM 8.6* 8.5*    GFR: Estimated Creatinine Clearance: 47.7 mL/min (by C-G formula based on SCr of 0.61 mg/dL).  Coagulation Profile:  Recent Labs Lab 03/21/17 2113  INR 1.02     Recent Results (from the past 240 hour(s))  Surgical pcr screen     Status: None   Collection Time: 03/22/17 12:45 AM  Result Value Ref Range Status   MRSA, PCR NEGATIVE NEGATIVE Final   Staphylococcus aureus NEGATIVE NEGATIVE Final    Comment: (NOTE) The Xpert SA Assay (FDA approved for NASAL specimens in patients 17 years of age and older), is one component of a comprehensive surveillance program. It is not intended to diagnose infection nor to guide or monitor treatment.       Radiology Studies: Ct Hip Right Wo Contrast  Result Date: 03/21/2017 CLINICAL DATA:  Fall onto right hip while walking with  displaced fracture on x-rays. Possible acetabular fracture. EXAM: CT OF THE RIGHT HIP WITHOUT CONTRAST TECHNIQUE: Multidetector CT imaging of the right hip was performed according to the standard protocol. Multiplanar CT image reconstructions were also generated. COMPARISON:  Plain films earlier today.  CT 09/05/2011 FINDINGS: Bones/Joint/Cartilage Examination demonstrates evidence of patient's displaced subcapital right femoral neck fracture. There is no evidence of a mid acetabular fracture. No other fractures identified. Mild degenerative change of the right hip. Ligaments Suboptimally assessed by CT. Muscles and Tendons Unremarkable. Soft tissues Mild calcified plaque over they right external iliac artery. Diverticulosis of the colon. IMPRESSION:  Displaced subcapital fracture of the right femoral neck. No acetabular fracture. Electronically Signed   By: Marin Olp M.D.   On: 03/21/2017 22:29   Dg Chest Port 1 View  Result Date: 03/22/2017 CLINICAL DATA:  Hip fracture, former smoker. EXAM: PORTABLE CHEST 1 VIEW COMPARISON:  07/10/2007. FINDINGS: Increased cardiomediastinal silhouette. Calcified tortuous aorta. No consolidation or edema. In the RIGHT upper lobe, overlying the ribs, and scapula, there is a possible 16 mm pulmonary nodule. Compared with previous study in 2009, a similar density is not seen although there is more confluence of bony shadows on today's study. CT chest with contrast recommended for further evaluation. Compared with previous exam, RIGHT breast implant has been removed. There is a RIGHT mastectomy. Pectus deformity is noted. Degenerative changes spine. Degenerative change both shoulders. Calcified LEFT axillary lymph nodes. Biapical pleural thickening. IMPRESSION: Cardiomegaly without active infiltrates or failure. Cannot exclude 16 mm RIGHT upper lobe nodule. Recommend CT chest with contrast for further evaluation. These results will be called to the ordering clinician or representative by the Radiologist Assistant, and communication documented in the PACS or zVision Dashboard. Electronically Signed   By: Staci Righter M.D.   On: 03/22/2017 08:32   Dg Hip Unilat  With Pelvis 2-3 Views Right  Result Date: 03/21/2017 CLINICAL DATA:  Right hip pain post fall today. EXAM: DG HIP (WITH OR WITHOUT PELVIS) 2-3V RIGHT COMPARISON:  None. FINDINGS: Diffuse osteopenia. Mild symmetric degenerative change of the hips. Displaced subcapital/transcervical fracture of the right femoral neck. Moderate degenerate change of the spine. IMPRESSION: Displaced right femoral neck fracture. Electronically Signed   By: Marin Olp M.D.   On: 03/21/2017 21:21   Dg Femur 1v Right  Result Date: 03/21/2017 CLINICAL DATA:  Fall today with right hip  pain. EXAM: RIGHT FEMUR 1 VIEW COMPARISON:  None. FINDINGS: Examination demonstrates diffuse osteopenia. There mild degenerative changes of the right hip. There is a moderately displaced subcapital/transcervical fracture of the right femoral neck. Minimal focal irregularity over the mid acetabulum along the pelvic rim as would be difficult to exclude a fracture in this location as this was not appreciated on patient's right hip films. Evidence of previous right knee replacement with hardware intact. IMPRESSION: Displaced right femoral neck fracture. Cannot completely exclude a subtle fracture of the right mid acetabulum. Electronically Signed   By: Marin Olp M.D.   On: 03/21/2017 21:23     Medications:  Scheduled: . chlorhexidine  60 mL Topical Once  . [MAR Hold] dorzolamide  1 drop Right Eye BID  . [MAR Hold] dorzolamide-timolol  1 drop Both Eyes BID  . [MAR Hold] latanoprost  1 drop Both Eyes QHS  . povidone-iodine  2 application Topical Once   Continuous: . clindamycin (CLEOCIN) IV    . lactated ringers 10 mL/hr at 03/22/17 1003  . tranexamic acid     PRN:[MAR Hold]  morphine injection  Assessment/Plan:  Active Problems:   Malignant neoplasm of female breast (HCC)   Rheumatoid arthritis, seronegative, multiple sites ([place])   Closed right hip fracture (HCC)   Closed right hip fracture, initial encounter ([place])    Right hip fracture secondary to mechanical fall. Patient to undergo surgical intervention today. She is thought to have moderate risk for this intermediate risk procedure. Pain control.  Elevated blood pressure without known history of hypertension. Elevated blood pressure, most likely secondary to pain. Continue to monitor for now.  Possible 16 mm right-sided pulmonary nodule This was seen on chest x-ray. This will need further evaluation with a CT scan of the chest. This will need to be done once she is stable from her fracture standpoint. Patient does have a  history of breast cancer.  History of breast cancer. She is status post right mastectomy.  History of glaucoma. Continue with eyedrops  History of seronegative rheumatoid arthritis. Patient is on hydroxychloroquine at home. This can be resumed after surgery.  DVT Prophylaxis: SCDs for now. Definitive prophylaxis to be determined by orthopedics.    Code Status: Full code  Family Communication: No family at bedside. Discussed with the patient  Disposition Plan: Management as outlined above    LOS: 1 day   [facility] Pager [phone] 03/22/2017, 11:43 AM  If 7PM-7AM, please contact night-coverage at [email or web address], password [facility]

## 2017-03-23 ENCOUNTER — Encounter (HOSPITAL_COMMUNITY): Payer: Self-pay | Admitting: Orthopedic Surgery

## 2017-03-23 DIAGNOSIS — M0609 Rheumatoid arthritis without rheumatoid factor, multiple sites: Secondary | ICD-10-CM

## 2017-03-23 LAB — CBC
HEMATOCRIT: 34.1 % — AB (ref 36.0–46.0)
HEMOGLOBIN: 11.2 g/dL — AB (ref 12.0–15.0)
MCH: 29.2 pg (ref 26.0–34.0)
MCHC: 32.8 g/dL (ref 30.0–36.0)
MCV: 89 fL (ref 78.0–100.0)
Platelets: 171 10*3/uL (ref 150–400)
RBC: 3.83 MIL/uL — ABNORMAL LOW (ref 3.87–5.11)
RDW: 13.6 % (ref 11.5–15.5)
WBC: 9.3 10*3/uL (ref 4.0–10.5)

## 2017-03-23 LAB — BASIC METABOLIC PANEL
ANION GAP: 8 (ref 5–15)
BUN: 15 mg/dL (ref 6–20)
CHLORIDE: 101 mmol/L (ref 101–111)
CO2: 27 mmol/L (ref 22–32)
Calcium: 8.2 mg/dL — ABNORMAL LOW (ref 8.9–10.3)
Creatinine, Ser: 0.71 mg/dL (ref 0.44–1.00)
GFR calc non Af Amer: >60 mL/min (ref >60)
Glucose, Bld: 130 mg/dL — ABNORMAL HIGH (ref 65–99)
POTASSIUM: 3.9 mmol/L (ref 3.5–5.1)
Sodium: 136 mmol/L (ref 135–145)

## 2017-03-23 MED ORDER — ASPIRIN 81 MG PO CHEW: 81.0000 mg | CHEWABLE_TABLET | Freq: Two times a day (BID) | ORAL | 1 refills | Status: DC

## 2017-03-23 MED ORDER — HYDROCODONE-ACETAMINOPHEN 5-325 MG PO TABS: 1.0000 | ORAL_TABLET | Freq: Four times a day (QID) | ORAL | 0 refills | Status: DC | PRN

## 2017-03-23 NOTE — Plan of Care (Signed)
Problem: Pain Managment: Goal: General experience of comfort will improve Patient voices understanding of pain scale and calls for pain medication when needed   

## 2017-03-23 NOTE — Progress Notes (Signed)
[facility] PROGRESS NOTE  CASSIDIE VEIGA PPI:[id number] DOB: 1926/09/14 DOA: 03/21/2017  PCP: Shon Baton, MD  Brief History/Interval Summary: 83 year old Caucasian female with a past medical history of seronegative rheumatoid arthritis on hydroxychloroquine, history of breast cancer status post mastectomy, presented after sustaining a mechanical fall. She was found to have right hip fracture. She was admitted for further management.  Subjective/Interval History: -feels ok, no complaints, waiting for PT  Assessment/Plan: Right hip fracture secondary to mechanical fall. -Appreciate Ortho consult -s/p R hip hemiarthroplasty 10/3 -stable post op -PT eval today -ASA 81mg  BID started per Ortho for DVT prophylaxis -will need Rehab  Elevated blood pressure without known history of hypertension. -stable now, monitor  Possible 16 mm right-sided pulmonary nodule -noted on x-ray. -needs further evaluation with a CT scan of the chest as outpatient, known h/o Breast Ca  History of breast cancer. She is status post right mastectomy.  History of glaucoma. Continue with eyedrops  History of seronegative rheumatoid arthritis. Patient is on hydroxychloroquine at home. This can be resumed after surgery.  DVT Prophylaxis:ASA BID started per Ortho Code Status: Full code  Family Communication:dtr at bedside.  Disposition Plan: SNF tomorrow if stable  Consultants: Orthopedics  Procedures: None yet  Antibiotics: None  Objective:  Vital Signs  Vitals:   03/22/17 2126 03/23/17 0125 03/23/17 0523 03/23/17 0821  BP: (!) 139/54 (!) 122/52 140/66 (!) 114/55  Pulse: 83 76 81 86  Resp: 18 16 16 16   Temp: 99.1 F (37.3 C) 98.6 F (37 C) 98.6 F (37 C) 98.9 F (37.2 C)  TempSrc: Oral Oral Oral Oral  SpO2: 94% 97% 98% 95%  Weight:      Height:        Intake/Output Summary (Last 24 hours) at 03/23/17 1257 Last data filed at 03/23/17 0830  Gross per 24 hour  Intake              1175 ml  Output              500 ml  Net              675 ml   Filed Weights   03/22/17 0040  Weight: 61 kg (134 lb 7.7 oz)    General appearance: alert, cooperative, appears stated age and no distress Head: Normocephalic, without obvious abnormality, atraumatic Resp: clear to auscultation bilaterally Cardio: regular rate and rhythm, S1, S2 normal, no murmur, click, rub or gallop GI: soft, non-tender; bowel sounds normal; no masses,  no organomegaly Extremities: Right lower extremity is externally rotated. Neurologic: She is awake, alert. Oriented 3. No focal neurological deficits.  Lab Results:  Data Reviewed: I have personally reviewed following labs and imaging studies  CBC:  Recent Labs Lab 03/21/17 2113 03/22/17 0532 03/23/17 0434  WBC 6.2 7.9 9.3  NEUTROABS 4.3  --   --   HGB 13.0 13.0 11.2*  HCT 39.6 39.8 34.1*  MCV 87.0 88.6 89.0  PLT 211 194 063    Basic Metabolic Panel:  Recent Labs Lab 03/21/17 2113 03/22/17 0532 03/23/17 0434  NA 138 138 136  K 3.9 3.7 3.9  CL 104 104 101  CO2 25 26 27   GLUCOSE 112* 134* 130*  BUN 24* 20 15  CREATININE 0.72 0.61 0.71  CALCIUM 8.6* 8.5* 8.2*    GFR: Estimated Creatinine Clearance: 47.7 mL/min (by C-G formula based on SCr of 0.71 mg/dL).  Coagulation Profile:  Recent Labs Lab 03/21/17 2113  INR 1.02  Recent Results (from the past 240 hour(s))  Surgical pcr screen     Status: None   Collection Time: 03/22/17 12:45 AM  Result Value Ref Range Status   MRSA, PCR NEGATIVE NEGATIVE Final   Staphylococcus aureus NEGATIVE NEGATIVE Final    Comment: (NOTE) The Xpert SA Assay (FDA approved for NASAL specimens in patients 19 years of age and older), is one component of a comprehensive surveillance program. It is not intended to diagnose infection nor to guide or monitor treatment.       Radiology Studies: Pelvis Portable  Result Date: 03/22/2017 CLINICAL DATA:  Right femoral neck fracture.  Status post right hip hemiarthroplasty. EXAM: PORTABLE PELVIS 1-2 VIEWS COMPARISON:  CT scan dated 03/21/2017 FINDINGS: Knee hemiarthroplasty prosthesis appears in excellent position in the AP projection. No fractures. Slight osteoarthritis of the left hip. IMPRESSION: Satisfactory appearance of the right hip in the AP projection after hemiarthroplasty. Electronically Signed   By: Lorriane Shire M.D.   On: 03/22/2017 14:39   Ct Hip Right Wo Contrast  Result Date: 03/21/2017 CLINICAL DATA:  Fall onto right hip while walking with displaced fracture on x-rays. Possible acetabular fracture. EXAM: CT OF THE RIGHT HIP WITHOUT CONTRAST TECHNIQUE: Multidetector CT imaging of the right hip was performed according to the standard protocol. Multiplanar CT image reconstructions were also generated. COMPARISON:  Plain films earlier today.  CT 09/05/2011 FINDINGS: Bones/Joint/Cartilage Examination demonstrates evidence of patient's displaced subcapital right femoral neck fracture. There is no evidence of a mid acetabular fracture. No other fractures identified. Mild degenerative change of the right hip. Ligaments Suboptimally assessed by CT. Muscles and Tendons Unremarkable. Soft tissues Mild calcified plaque over they right external iliac artery. Diverticulosis of the colon. IMPRESSION: Displaced subcapital fracture of the right femoral neck. No acetabular fracture. Electronically Signed   By: Marin Olp M.D.   On: 03/21/2017 22:29   Dg Chest Port 1 View  Result Date: 03/22/2017 CLINICAL DATA:  Hip fracture, former smoker. EXAM: PORTABLE CHEST 1 VIEW COMPARISON:  07/10/2007. FINDINGS: Increased cardiomediastinal silhouette. Calcified tortuous aorta. No consolidation or edema. In the RIGHT upper lobe, overlying the ribs, and scapula, there is a possible 16 mm pulmonary nodule. Compared with previous study in 2009, a similar density is not seen although there is more confluence of bony shadows on today's study. CT chest  with contrast recommended for further evaluation. Compared with previous exam, RIGHT breast implant has been removed. There is a RIGHT mastectomy. Pectus deformity is noted. Degenerative changes spine. Degenerative change both shoulders. Calcified LEFT axillary lymph nodes. Biapical pleural thickening. IMPRESSION: Cardiomegaly without active infiltrates or failure. Cannot exclude 16 mm RIGHT upper lobe nodule. Recommend CT chest with contrast for further evaluation. These results will be called to the ordering clinician or representative by the Radiologist Assistant, and communication documented in the PACS or zVision Dashboard. Electronically Signed   By: Staci Righter M.D.   On: 03/22/2017 08:32   Dg C-arm 1-60 Min  Result Date: 03/22/2017 CLINICAL DATA:  Status post anterior approach right hip joint replacement. EXAM: DG C-ARM 61-120 MIN; OPERATIVE RIGHT HIP WITH PELVIS COMPARISON:  Preoperative examination of March 21, 2017 FINDINGS: Two fluoro spot images are reviewed. The reported fluoro time is 7 seconds. The patient has undergone right total hip joint prosthesis placement. Radiographic positioning of the prosthetic components is good. The interface with the native bone is normal. IMPRESSION: There is no acute postprocedure complication following right total hip joint prosthesis placement. Electronically Signed  By: David  Martinique M.D.   On: 03/22/2017 14:03   Dg Hip Operative Unilat W Or W/o Pelvis Right  Result Date: 03/22/2017 CLINICAL DATA:  Status post anterior approach right hip joint replacement. EXAM: DG C-ARM 61-120 MIN; OPERATIVE RIGHT HIP WITH PELVIS COMPARISON:  Preoperative examination of March 21, 2017 FINDINGS: Two fluoro spot images are reviewed. The reported fluoro time is 7 seconds. The patient has undergone right total hip joint prosthesis placement. Radiographic positioning of the prosthetic components is good. The interface with the native bone is normal. IMPRESSION: There is no  acute postprocedure complication following right total hip joint prosthesis placement. Electronically Signed   By: David  Martinique M.D.   On: 03/22/2017 14:03   Dg Hip Unilat  With Pelvis 2-3 Views Right  Result Date: 03/21/2017 CLINICAL DATA:  Right hip pain post fall today. EXAM: DG HIP (WITH OR WITHOUT PELVIS) 2-3V RIGHT COMPARISON:  None. FINDINGS: Diffuse osteopenia. Mild symmetric degenerative change of the hips. Displaced subcapital/transcervical fracture of the right femoral neck. Moderate degenerate change of the spine. IMPRESSION: Displaced right femoral neck fracture. Electronically Signed   By: Marin Olp M.D.   On: 03/21/2017 21:21   Dg Femur 1v Right  Result Date: 03/21/2017 CLINICAL DATA:  Fall today with right hip pain. EXAM: RIGHT FEMUR 1 VIEW COMPARISON:  None. FINDINGS: Examination demonstrates diffuse osteopenia. There mild degenerative changes of the right hip. There is a moderately displaced subcapital/transcervical fracture of the right femoral neck. Minimal focal irregularity over the mid acetabulum along the pelvic rim as would be difficult to exclude a fracture in this location as this was not appreciated on patient's right hip films. Evidence of previous right knee replacement with hardware intact. IMPRESSION: Displaced right femoral neck fracture. Cannot completely exclude a subtle fracture of the right mid acetabulum. Electronically Signed   By: Marin Olp M.D.   On: 03/21/2017 21:23     Medications:  Scheduled: . aspirin  81 mg Oral BID WC  . docusate sodium  100 mg Oral BID  . dorzolamide  1 drop Right Eye BID  . dorzolamide-timolol  1 drop Both Eyes BID  . latanoprost  1 drop Both Eyes QHS  . senna  1 tablet Oral BID   Continuous: . lactated ringers 10 mL/hr at 03/22/17 1003  . methocarbamol (ROBAXIN)  IV     JJO:[id number] **OR** acetaminophen, HYDROcodone-acetaminophen, menthol-cetylpyridinium **OR** phenol, methocarbamol **OR** methocarbamol  (ROBAXIN)  IV, morphine injection, ondansetron **OR** ondansetron (ZOFRAN) IV, polyethylene glycol   LOS: 2 days   Judye Lorino  [facility] Page via [email or web address], password [facility] 03/23/2017, 12:57 PM  If 7PM-7AM, please contact night-coverage at [email or web address], password [facility] [address]

## 2017-03-23 NOTE — Progress Notes (Signed)
Orthopedic Tech Progress Note Patient Details:  Dawn Diaz [birth year] [id number]  Patient ID: Dawn Diaz, female   DOB: 1926/09/19, 83 y.o.   MRN: [id number] Pt cant have OHF due to age restrictions.   Karolee Stamps 03/23/2017, 4:01 AM

## 2017-03-23 NOTE — Progress Notes (Signed)
Nutrition Follow-up  DOCUMENTATION CODES:   Not applicable  INTERVENTION:  Continue regular diet.  Encourage adequate PO intake.  NUTRITION DIAGNOSIS:   Increased nutrient needs related to acute illness (hip fracture surgery) as evidenced by estimated needs; ongoing  GOAL:   Patient will meet greater than or equal to 90% of their needs; progressing  MONITOR:   PO intake, Labs, Weight trends, Skin, I & O's  REASON FOR ASSESSMENT:   Consult Hip fracture protocol  ASSESSMENT:   83 year old female admitted for right hip fracture. PMH of seronegative rheumatoid arthritis, breast cancer s/p mastectomy, and osteoporosis.  PROCEDURE (10/4): Right hip hemiarthroplasty, anterior approach.   Meal completion 75% this AM. Pt reports having a good appetite with no other difficulties and has been enjoying her food at meals. RD offered nutritional supplements and nourishment snacks, however pt politely declined reporting she is eating well at meal times. Pt encouraged to eat her food at meals.   Nutrition-Focused physical exam completed. Findings are no fat depletion, mild to moderate muscle depletion, and mild edema. Depletion likely related to the natural aging process.  Labs and medications reviewed.   Diet Order:  Diet regular Room service appropriate? Yes; Fluid consistency: Thin  Skin:   (Incision on R hip)  Last BM:  10/3  Height:   Ht Readings from Last 1 Encounters:  03/22/17 5\' 7"  (1.702 m)    Weight:   Wt Readings from Last 1 Encounters:  03/22/17 134 lb 7.7 oz (61 kg)    Ideal Body Weight:  61.4 kg  BMI:  Body mass index is 21.06 kg/m.  Estimated Nutritional Needs:   Kcal:  1500-1700  Protein:  65-75 grams protein  Fluid:  >/= 1.5 L/day  EDUCATION NEEDS:   No education needs identified at this time  Corrin Parker, MS, RD, LDN Pager # [phone] After hours/ weekend pager # [phone]

## 2017-03-23 NOTE — Clinical Social Work Note (Signed)
Clinical Social Work Assessment  Patient Details  Name: Dawn Diaz MRN: [id number] Diaz of Birth: [birth year]  Diaz of referral:  03/23/17               Reason for consult:  Facility Placement                Permission sought to share information with:  Facility Art therapist granted to share information::  Yes, Verbal Permission Granted  Name::     Veronda Prude  Agency::  [facility]  Relationship::     Contact Information:     Housing/Transportation Living arrangements for the past 2 months:  [place] of Information:  Patient, Facility Patient Interpreter Needed:  None Criminal Activity/Legal Involvement Pertinent to Current Situation/Hospitalization:  No - Comment as needed Significant Relationships:  Adult Children, Other Family Members Lives with:  Self Do you feel safe going back to the place where you live?  Yes Need for family participation in patient care:  No (Coment)  Care giving concerns:  Pt from [facility] independent living.  She reports being independent with ADL's prior to hospitalization. She is a widow and has support of her daughter Belenda Cruise.  She is planning to return to ALF where she will get Skilled Nursing needs met at ALF.  Social Worker assessment / plan:  CSW met with patient at bedside to discuss the SNF process and her returning to ALF to received skilled nursing. Pt confirms that she pays for therapy provided by her ALF. She gave CSW permission to discuss with ALF. CSW will complete FL2, obtain passr and send to [facility]. CSW discussed her role and the process. CSW advised patient that transport will be set up on the day of transition to SNF.  Employment status:  Retired Forensic scientist:  Medicare PT Recommendations:  [place] / Referral to community resources:  [place]  Patient/Family's Response to care:  Patient appreciative of [place]  meeting with her to discuss SNF options and returning to [facility]. No other issues or concerns identified at that time.  Patient/Family's Understanding of and Emotional Response to Diagnosis, Current Treatment, and Prognosis:  Patient has good understanding of diagnosis, current treatment and prognosis. Pt hopeful that she will get the therapy needed to return to independent living. No issues or concerns identified.  Emotional Assessment Appearance:  Appears stated age Attitude/Demeanor/Rapport:   (Cooperative) Affect (typically observed):  Accepting, Appropriate Orientation:  Oriented to Self, Oriented to Place, Oriented to  Time, Oriented to Situation Alcohol / Substance use:  Not Applicable Psych involvement (Current and /or in the community):  No (Comment)  Discharge Needs  Concerns to be addressed:  Care Coordination Readmission within the last 30 days:  No Current discharge risk:  Dependent with Mobility, Physical Impairment Barriers to Discharge:  No Barriers Identified   Normajean Baxter, LCSW 03/23/2017, 11:40 AM

## 2017-03-23 NOTE — Anesthesia Postprocedure Evaluation (Signed)
Anesthesia Post Note  Patient: Dawn Diaz  Procedure(s) Performed: ANTERIOR APPROACH HEMI HIP ARTHROPLASTY (Right ) ANTERIOR APPROACH HEMI HIP ARTHROPLASTY (Right )     Patient location during evaluation: PACU Anesthesia Type: General Level of consciousness: awake and alert Pain management: pain level controlled Vital Signs Assessment: post-procedure vital signs reviewed and stable Respiratory status: spontaneous breathing, nonlabored ventilation, respiratory function stable and patient connected to nasal cannula oxygen Cardiovascular status: blood pressure returned to baseline and stable Postop Assessment: no apparent nausea or vomiting Anesthetic complications: no    Last Vitals:  Vitals:   03/23/17 0821 03/23/17 1417  BP: (!) 114/55 (!) 103/42  Pulse: 86 83  Resp: 16 16  Temp: 37.2 C 37.2 C  SpO2: 95% 94%    Last Pain:  Vitals:   03/23/17 1627  TempSrc:   PainSc: 2    Pain Goal: Patients Stated Pain Goal: 3 (03/22/17 2252)               Dustine Bertini S

## 2017-03-23 NOTE — Evaluation (Signed)
Physical Therapy Evaluation Patient Details Name: Dawn Diaz MRN: [id number] DOB: Feb 27, 1927 Today's Date: 03/23/2017   History of Present Illness  Pt is an 83 y.o. female admitted post-fall; now s/p R THA (direct anterior approach) on 03/21/17. [facility] from [facility]. Pertinent PMH includes osteporosis, IBS, TIAs, GERD, glaucoma, depression, breast CA, anxiety.     Clinical Impression  Pt presents with pain and an overall decrease in functional mobility secondary to above. PTA, pt living at [facility] independent living and is indep with mobility. Educ on positioning, therex, and fall risk reduction. Today, pt able to transfer and amb with RW and min guard for balance. Pt would benefit from continued acute PT services to maximize functional mobility and independence. Pt plans to d/c to [facility] prior to returning to her indep living home.     Follow Up Recommendations SNF;Supervision for mobility/OOB    Equipment Recommendations  Rolling walker with 5" wheels    Recommendations for Other Services       Precautions / Restrictions Precautions Precautions: Fall Restrictions Weight Bearing Restrictions: Yes RLE Weight Bearing: Weight bearing as tolerated      Mobility  Bed Mobility               General bed mobility comments: Received sitting in chair  Transfers Overall transfer level: Needs assistance Equipment used: Rolling walker (2 wheeled) Transfers: Sit to/from Stand           General transfer comment: Stood with RW and min guard for balance; cues for hand placement and safe technique  Ambulation/Gait Ambulation/Gait assistance: Min guard Ambulation Distance (Feet): 80 Feet Assistive device: Rolling walker (2 wheeled) Gait Pattern/deviations: Step-through pattern;Decreased stride length;Decreased weight shift to right;Antalgic Gait velocity: Decreased Gait velocity interpretation: <1.8 ft/sec,  indicative of risk for recurrent falls General Gait Details: Amb with RW and min guard for balance; cues for technique with RW, especially when turning. Denies dizziness with mobility, but reports "grogginess" (potentially secondary to meds).   Stairs            Wheelchair Mobility    Modified Rankin (Stroke Patients Only)       Balance Overall balance assessment: Needs assistance Sitting-balance support: No upper extremity supported;Feet supported Sitting balance-Leahy Scale: Good     Standing balance support: Bilateral upper extremity supported;Single extremity supported;During functional activity Standing balance-Leahy Scale: Fair Standing balance comment: Able to static stand with no UE support                             Pertinent Vitals/Pain Pain Assessment: Faces Faces Pain Scale: Hurts a little bit Pain Location: R hip Pain Descriptors / Indicators: Sore;Aching Pain Intervention(s): Monitored during session;Repositioned    Home Living Family/patient expects to be discharged to:: Assisted living               Home Equipment: None Additional Comments: [facility] from [facility] Hopes to d/c to rehab center short-term at [facility], then back to her independent living [place]    Prior Function Level of Independence: Independent               Hand Dominance        Extremity/Trunk Assessment   Upper Extremity Assessment Upper Extremity Assessment: Overall WFL for tasks assessed    Lower Extremity Assessment Lower Extremity Assessment: RLE deficits/detail RLE Deficits / Details: s/p R THA;  R hip flexion grossly 2/5, knee ext/flex 3/5 RLE Sensation: decreased light touch       Communication   Communication: No difficulties  Cognition Arousal/Alertness: Awake/alert Behavior During Therapy: WFL for tasks assessed/performed Overall Cognitive Status: Within Functional Limits for tasks assessed                                         General Comments      Exercises General Exercises - Lower Extremity Long Arc Quad: AROM;Both;10 reps;Seated Hip Flexion/Marching: AROM;Both;10 reps;Seated Toe Raises: AROM;Both;10 reps;Seated Heel Raises: AROM;10 reps;Seated   Assessment/Plan    PT Assessment Patient needs continued PT services  PT Problem List Decreased strength;Decreased range of motion;Decreased activity tolerance;Decreased balance;Decreased mobility;Decreased knowledge of use of DME;Pain       PT Treatment Interventions DME instruction;Gait training;Functional mobility training;Therapeutic activities;Therapeutic exercise;Balance training;Patient/family education;Stair training    PT Goals (Current goals can be found in the Care Plan section)  Acute Rehab PT Goals Patient Stated Goal: Get [facility] prior to d/c back to [facility] PT Goal Formulation: With patient Time For Goal Achievement: 04/06/17 Potential to Achieve Goals: Good    Frequency 7X/week   Barriers to discharge        Co-evaluation               AM-PAC PT "6 Clicks" Daily Activity  Outcome Measure Difficulty turning over in bed (including adjusting bedclothes, sheets and blankets)?: A Little Difficulty moving from lying on back to sitting on the side of the bed? : A Little Difficulty sitting down on and standing up from a chair with arms (e.g., wheelchair, bedside commode, etc,.)?: A Little Help needed moving to and from a bed to chair (including a wheelchair)?: A Little Help needed walking in hospital room?: A Little Help needed climbing 3-5 steps with a railing? : A Lot 6 Click Score: 17    End of Session Equipment Utilized During Treatment: Gait belt Activity Tolerance: Patient tolerated treatment well Patient left: in chair;with call bell/phone within reach Nurse Communication: Mobility status PT Visit Diagnosis: Other  abnormalities of gait and mobility (R26.89);Pain Pain - Right/Left: Right Pain - part of body: Hip    Time: 4665-9935 PT Time Calculation (min) (ACUTE ONLY): 28 min   Charges:   PT Evaluation $PT Eval Moderate Complexity: 1 Mod PT Treatments $Therapeutic Exercise: 8-22 mins   PT G Codes:       Mabeline Caras, PT, DPT Acute Rehab Services  Pager: [place] 03/23/2017, 10:52 AM

## 2017-03-23 NOTE — Evaluation (Signed)
Occupational Therapy Evaluation Patient Details Name: Dawn Diaz MRN: [id number] DOB: [birth year] Today's Date: 03/23/2017    History of Present Illness Pt is an 83 y.o. female admitted post-fall; now s/p R THA (direct anterior approach) on 03/21/17. Pt from [facility]. Pertinent PMH includes osteporosis, IBS, TIAs, GERD, glaucoma, depression, breast CA, anxiety.    Clinical Impression   Pt admitted as stated above currently demonstrating deficits in her ability to perform ADL's and functional transfers (see OT problem list below). Overall Min A at this time & should benefit from acute OT to assist in maximizing independence with ADL/transfers, then pt plans to go to [facility] (she lives alone in independent villa there).     Follow Up Recommendations  SNF;Supervision/Assistance - 24 hour    Equipment Recommendations  Other (comment) (Defer to next venue)    Recommendations for Other Services       Precautions / Restrictions Precautions Precautions: Fall Restrictions Weight Bearing Restrictions: Yes RLE Weight Bearing: Weight bearing as tolerated      Mobility Bed Mobility Overal bed mobility:  (Pt up in chair upon OT arrival)             General bed mobility comments: Received sitting in chair  Transfers Overall transfer level: Needs assistance Equipment used: Rolling walker (2 wheeled) Transfers: Sit to/from Stand Sit to Stand: Min assist         General transfer comment: Stood with RW and min guard for balance; cues for hand placement and safe technique    Balance Overall balance assessment: Needs assistance Sitting-balance support: No upper extremity supported;Feet supported Sitting balance-Leahy Scale: Good     Standing balance support: Bilateral upper extremity supported;Single extremity supported;During functional activity Standing balance-Leahy Scale: Fair Standing balance comment: Able to static stand  with no UE support                           ADL either performed or assessed with clinical judgement   ADL Overall ADL's : Needs assistance/impaired Eating/Feeding: Sitting;Modified independent   Grooming: Wash/dry hands;Standing;Minimal assistance   Upper Body Bathing: Set up;Sitting   Lower Body Bathing: Minimal assistance;Sit to/from stand;Cueing for safety;Cueing for sequencing   Upper Body Dressing : Set up;Sitting   Lower Body Dressing: Minimal assistance;Sit to/from stand;Cueing for safety   Toilet Transfer: Minimal assistance;BSC;RW;Ambulation Toilet Transfer Details (indicate cue type and reason): 3:1 over toilet vc's for safety and sequencing with RW/all aspects of transfer Toileting- Clothing Manipulation and Hygiene: Sit to/from stand;Minimal assistance       Functional mobility during ADLs: Minimal assistance;Rolling walker;Cueing for sequencing General ADL Comments: Pt was educated in role of OT and assessment was performed. Pt agreed to ADL retraining session for functional mobility in room, transfer to toilet and grooming standing at sink. Overall cognition appears intact/WFL for tasks performed but pt also noted to repeat questions at times - continue to assess in functional context as this may be related to medications. Ice applied to R hip after session today for assist in pain control.     Vision Baseline Vision/History: Wears glasses Wears Glasses: At all times Patient Visual Report: No change from baseline       Perception     Praxis      Pertinent Vitals/Pain Pain Assessment: No/denies pain Pain Score: 0-No pain Faces Pain Scale: Hurts a little bit Pain Location: R hip Pain Descriptors / Indicators: Sore;Aching Pain Intervention(s):  Monitored during session;Repositioned     Hand Dominance Right   Extremity/Trunk Assessment Upper Extremity Assessment Upper Extremity Assessment: Overall WFL for tasks assessed   Lower Extremity  Assessment Lower Extremity Assessment: Defer to PT evaluation RLE Deficits / Details: s/p R THA; R hip flexion grossly 2/5, knee ext/flex 3/5 RLE Sensation: decreased light touch       Communication Communication Communication: No difficulties   Cognition Arousal/Alertness: Awake/alert Behavior During Therapy: WFL for tasks assessed/performed Overall Cognitive Status: Within Functional Limits for tasks assessed                                     General Comments       Exercises General Exercises - Lower Extremity Long Arc Quad: AROM;Both;10 reps;Seated Hip Flexion/Marching: AROM;Both;10 reps;Seated Toe Raises: AROM;Both;10 reps;Seated Heel Raises: AROM;10 reps;Seated   Shoulder Instructions      Home Living Family/patient expects to be discharged to:: Assisted living Living Arrangements: [place]: None   Additional Comments: [facility] from [facility]. Hopes to d/c to rehab center short-term at [facility], then back to her independent living villa      Prior Functioning/Environment Level of Independence: Independent                 OT Problem List: Impaired balance (sitting and/or standing);Pain;Decreased activity tolerance;Decreased knowledge of use of DME or AE      OT Treatment/Interventions: Self-care/ADL training;DME and/or AE instruction;Therapeutic activities;Patient/family education    OT Goals(Current goals can be found in the care plan section) Acute Rehab OT Goals Patient Stated Goal: [facility] to d/c back to [facility] Time For Goal Achievement: 04/06/17 Potential to Achieve Goals: Good  OT Frequency: Min 2X/week   Barriers to D/C:    Lives alone, plans to d/c to [facility] as she has a villa/lives there.       Co-evaluation              AM-PAC PT "6 Clicks" Daily Activity      Outcome Measure Help from another person eating meals?: None Help from another person taking care of personal grooming?: A Little Help from another person toileting, which includes using toliet, bedpan, or urinal?: A Little Help from another person bathing (including washing, rinsing, drying)?: A Little Help from another person to put on and taking off regular upper body clothing?: A Little Help from another person to put on and taking off regular lower body clothing?: A Little 6 Click Score: 19   End of Session Equipment Utilized During Treatment: Gait belt;Rolling walker  Activity Tolerance: Patient tolerated treatment well Patient left: in chair;with call bell/phone within reach;Other (comment) (Ice applied to R hip)  OT Visit Diagnosis: Unsteadiness on feet (R26.81);Other abnormalities of gait and mobility (R26.89);Pain Pain - Right/Left: Right Pain - part of body: Hip                Time: 1052-1110 OT Time Calculation (min): 18 min Charges:  OT General Charges $OT Visit: 1 Visit OT Evaluation $OT Eval Low Complexity: 1 Low OT Treatments $Self Care/Home Management : 8-22 mins G-Codes:      Jeanelle Dake Beth Dixon, OTR/L 03/23/2017, 11:40  AM

## 2017-03-23 NOTE — Progress Notes (Signed)
   Subjective:  Patient reports pain as mild to moderate.  No c/o.  Objective:   VITALS:   Vitals:   03/22/17 2126 03/23/17 0125 03/23/17 0523 03/23/17 0821  BP: (!) 139/54 (!) 122/52 140/66 (!) 114/55  Pulse: 83 76 81 86  Resp: 18 16 16 16   Temp: 99.1 F (37.3 C) 98.6 F (37 C) 98.6 F (37 C) 98.9 F (37.2 C)  TempSrc: Oral Oral Oral Oral  SpO2: 94% 97% 98% 95%  Weight:      Height:        NAD ABD soft Intact pulses distally Dorsiflexion/Plantar flexion intact Incision: dressing C/D/I Compartment soft   Lab Results  Component Value Date   WBC 9.3 03/23/2017   HGB 11.2 (L) 03/23/2017   HCT 34.1 (L) 03/23/2017   MCV 89.0 03/23/2017   PLT 171 03/23/2017   BMET    Component Value Date/Time   NA 136 03/23/2017 0434   K 3.9 03/23/2017 0434   CL 101 03/23/2017 0434   CO2 27 03/23/2017 0434   GLUCOSE 130 (H) 03/23/2017 0434   BUN 15 03/23/2017 0434   CREATININE 0.71 03/23/2017 0434   CREATININE 0.96 (H) 11/16/2016 1019   CALCIUM 8.2 (L) 03/23/2017 0434   GFRNONAA >60 03/23/2017 0434   GFRNONAA 53 (L) 11/16/2016 1019   GFRAA >60 03/23/2017 0434   GFRAA 61 11/16/2016 1019     Assessment/Plan: 1 Day Post-Op   Active Problems:   Malignant neoplasm of female breast (HCC)   Rheumatoid arthritis, seronegative, multiple sites ([place])   Closed right hip fracture (HCC)   Closed right hip fracture, initial encounter (HCC)   Breast cancer, stage 1 (HCC)   Closed displaced fracture of right femoral neck (HCC)   WBAT with walker DVT ppx: ASA, SCDs, TEDs PO pain control PT/OT D/C to [facility] tomorrow   Angelita Harnack, Horald Pollen 03/23/2017, 1:32 PM   Rod Can, MD Cell [phone]

## 2017-03-23 NOTE — NC FL2 (Signed)
[place] LEVEL OF CARE SCREENING TOOL     IDENTIFICATION  Patient Name: Dawn Diaz Birthdate: [birth year] Sex: female Admission Date (Current Location): 03/21/2017  [facility] and [place] Number:  Herbalist and Address:  The . [facility] [place] [address], [place], [place] [address]      Provider Number: [id number]  Attending Physician Name and Address:  Domenic Polite, MD  Relative Name and Phone Number:  daughter, Weyman Rodney [phone]    Current Level of Care: Hospital Recommended Level of Care: [place] Prior Approval Number:    Date Approved/Denied: 03/23/17 PASRR Number: [id number]  Discharge Plan: SNF    Current Diagnoses: Patient Active Problem List   Diagnosis Date Noted  . Closed displaced fracture of right femoral neck ([place]) 03/22/2017  . Breast cancer, stage 1 ([place])   . Closed right hip fracture ([place]) 03/21/2017  . Closed right hip fracture, initial encounter ([place]) 03/21/2017  . Closed fracture of neck of right femur ([place])   . Rheumatoid arthritis, seronegative, multiple sites ([place]) 11/15/2016  . Osteopenia 11/15/2016  . Primary osteoarthritis of both hands 11/15/2016  . High risk medications (not anticoagulants) long-term use 11/15/2016  . Family history of malignant neoplasm of gastrointestinal tract 12/25/2013  . Malignant neoplasm of female breast ([place]) 05/29/2013  . Constipation 04/24/2013  . Glaucoma 04/24/2013  . OA (osteoarthritis) of knee 04/15/2013  . HX: breast cancer 12/20/2010  . Rheumatoid arthritis(714.0) 12/20/2010  . DIVERTICULOSIS-COLON 08/06/2009  . IRRITABLE BOWEL SYNDROME 08/06/2009    Orientation RESPIRATION BLADDER Height & Weight     Self, Time, Situation, Place  Normal Continent Weight: 134 lb 7.7 oz (61 kg) Height:  5\' 7"  (170.2 cm)  BEHAVIORAL SYMPTOMS/MOOD NEUROLOGICAL BOWEL NUTRITION STATUS      Continent Diet (See DC Summary)  AMBULATORY STATUS  COMMUNICATION OF NEEDS Skin   Limited Assist Verbally Surgical wounds (Right Hip, Silver Hydrofiber)                       Personal Care Assistance Level of Assistance  Bathing, Feeding, Dressing Bathing Assistance: Limited assistance Feeding assistance: Independent Dressing Assistance: Limited assistance     Functional Limitations Info             SPECIAL CARE FACTORS FREQUENCY  PT (By licensed PT)     PT Frequency: 7x week              Contractures Contractures Info: Not present    Additional Factors Info  Code Status, Allergies Code Status Info: Full Code Allergies Info: AMOXICILLIN, LATEX            Current Medications (03/23/2017):  This is the current hospital active medication list Current Facility-Administered Medications  Medication Dose Route Frequency Provider Last Rate Last Dose  . acetaminophen (TYLENOL) tablet 650 mg  650 mg Oral Q6H PRN Swinteck, Aaron Edelman, MD       Or  . acetaminophen (TYLENOL) suppository 650 mg  650 mg Rectal Q6H PRN Swinteck, Aaron Edelman, MD      . aspirin chewable tablet 81 mg  81 mg Oral BID WC Rod Can, MD   81 mg at 03/23/17 0846  . docusate sodium (COLACE) capsule 100 mg  100 mg Oral BID Rod Can, MD   100 mg at 03/22/17 2252  . dorzolamide (TRUSOPT) 2 % ophthalmic solution 1 drop  1 drop Right Eye BID Rise Patience, MD      .  dorzolamide-timolol (COSOPT) 22.3-6.8 MG/ML ophthalmic solution 1 drop  1 drop Both Eyes BID Rise Patience, MD      . HYDROcodone-acetaminophen (NORCO/VICODIN) 5-325 MG per tablet 1-2 tablet  1-2 tablet Oral Q6H PRN Rod Can, MD   1 tablet at 03/22/17 2252  . lactated ringers infusion   Intravenous Continuous Albertha Ghee, MD 10 mL/hr at 03/22/17 1003    . latanoprost (XALATAN) 0.005 % ophthalmic solution 1 drop  1 drop Both Eyes QHS Rise Patience, MD      . menthol-cetylpyridinium (CEPACOL) lozenge 3 mg  1 lozenge Oral PRN Swinteck, Aaron Edelman, MD       Or  . phenol  (CHLORASEPTIC) mouth spray 1 spray  1 spray Mouth/Throat PRN Swinteck, Aaron Edelman, MD      . methocarbamol (ROBAXIN) tablet 500 mg  500 mg Oral Q6H PRN Rod Can, MD   500 mg at 03/22/17 2252   Or  . methocarbamol (ROBAXIN) 500 mg in dextrose 5 % 50 mL IVPB  500 mg Intravenous Q6H PRN Swinteck, Aaron Edelman, MD      . morphine 4 MG/ML injection 0.52 mg  0.52 mg Intravenous Q2H PRN Rise Patience, MD   0.52 mg at 03/22/17 0647  . ondansetron (ZOFRAN) tablet 4 mg  4 mg Oral Q6H PRN Swinteck, Aaron Edelman, MD       Or  . ondansetron (ZOFRAN) injection 4 mg  4 mg Intravenous Q6H PRN Swinteck, Aaron Edelman, MD      . polyethylene glycol (MIRALAX / GLYCOLAX) packet 17 g  17 g Oral Daily PRN Swinteck, Aaron Edelman, MD      . senna (SENOKOT) tablet 8.6 mg  1 tablet Oral BID Rod Can, MD   8.6 mg at 03/22/17 2252     Discharge Medications: Please see discharge summary for a list of discharge medications.  Relevant Imaging Results:  Relevant Lab Results:   Additional Information SS#: [id number]  [place], LCSW

## 2017-03-24 LAB — CBC
HEMATOCRIT: 34.8 % — AB (ref 36.0–46.0)
HEMOGLOBIN: 11 g/dL — AB (ref 12.0–15.0)
MCH: 28.2 pg (ref 26.0–34.0)
MCHC: 31.6 g/dL (ref 30.0–36.0)
MCV: 89.2 fL (ref 78.0–100.0)
Platelets: 172 10*3/uL (ref 150–400)
RBC: 3.9 MIL/uL (ref 3.87–5.11)
RDW: 13.5 % (ref 11.5–15.5)
WBC: 7.4 10*3/uL (ref 4.0–10.5)

## 2017-03-24 LAB — BASIC METABOLIC PANEL
ANION GAP: 4 — AB (ref 5–15)
BUN: 13 mg/dL (ref 6–20)
CHLORIDE: 103 mmol/L (ref 101–111)
CO2: 28 mmol/L (ref 22–32)
CREATININE: 0.68 mg/dL (ref 0.44–1.00)
Calcium: 7.9 mg/dL — ABNORMAL LOW (ref 8.9–10.3)
GFR calc non Af Amer: >60 mL/min (ref >60)
Glucose, Bld: 102 mg/dL — ABNORMAL HIGH (ref 65–99)
POTASSIUM: 3.3 mmol/L — AB (ref 3.5–5.1)
SODIUM: 135 mmol/L (ref 135–145)

## 2017-03-24 MED ORDER — SENNA 8.6 MG PO TABS: 1.0000 | ORAL_TABLET | Freq: Every day | ORAL | 0 refills | Status: DC | PRN

## 2017-03-24 NOTE — Progress Notes (Signed)
Removed IV, all questions and concerns addressed, Pt not in distress, called facility and gave report to Quita Skye, RN. Discharged to facility via [facility] along with Pt's belongings.

## 2017-03-24 NOTE — Care Management Important Message (Signed)
Important Message  Patient Details  Name: Dawn Diaz MRN: [id number] Date of Birth: [birth year]   Medicare Important Message Given:  Yes    [name] 03/24/2017, 12:19 PM

## 2017-03-24 NOTE — Social Work (Signed)
Clinical Social Worker facilitated patient discharge including contacting patient family and facility to confirm patient discharge plans.  Clinical information faxed to facility and family agreeable with plan.    CSW arranged ambulance transport via [facility] to [facility].    RN to call [phone] to give report prior to discharge.  Clinical Social Worker will sign off for now as social work intervention is no longer needed. Please consult [place] again if new need arises.  Elissa Hefty, LCSW Clinical Social Worker [phone]

## 2017-03-24 NOTE — Discharge Summary (Signed)
Physician Discharge Summary  Dawn Diaz PXT:[id number] DOB: [birth year] DOA: 03/21/2017  PCP: Shon Baton, MD  Admit date: 03/21/2017 Discharge date: 03/24/2017  Time spent: 45 minutes  Recommendations for Outpatient Follow-up:  1. Ortho Dr.Swinteck in 2 weeks 2. Needs FU CT chest in 4month, possible 60mm Pulm nodule noted on CXR 3. STOP ASA Bid after 4weeks   Discharge Diagnoses:    Closed right hip fracture ([place])   Closed right hip fracture, initial encounter ([place])   Pulmonary nodule   Malignant neoplasm of female breast ([place])   Rheumatoid arthritis, seronegative, multiple sites ([place])   Breast cancer, stage 1 ([place])   Discharge Condition: stable  Diet recommendation: heart healthy  Filed Weights   03/22/17 0040  Weight: 61 kg (134 lb 7.7 oz)    History of present illness:  83 year old Caucasian female with a past medical history of seronegative rheumatoid arthritis on hydroxychloroquine, history of breast cancer status post mastectomy, presented after sustaining a mechanical fall. She was found to have right hip fracture.  Hospital Course:  Right hip fracture secondary to mechanical fall. -Orthopedics  Consulted, underwent R hip hemiarthroplasty on  10/3 -remained stable post op -PT eval completed SNF recommended -ASA 81mg  BID started per Ortho for DVT prophylaxis -will be discharged to Rehab today  Elevated blood pressure without known history of hypertension. -stable now  Possible 16 mm right-sided pulmonary nodule -noted on x-ray. -needs further evaluation with a CT scan of the chest as outpatient, known h/o Breast Ca  History of breast cancer. She is status post right mastectomy. -was on tamoxifen, not taking this currently, FU with Oncology  History of glaucoma. Continue with eyedrops  History of seronegative rheumatoid arthritis. -on hydroxychloroquine at home, will resume  Procedures: PROCEDURE: Right hip hemiarthroplasty, anterior approach.  -10/3 Dr.Swinteck IMPLANTS: DePuy Tri Lock stem, size 7, std offset, with a -3 mm spacer and a 49 mm monopolar head ball.  Consultations:  Ortho Dr.Swinteck  Discharge Exam: Vitals:   03/24/17 0856 03/24/17 1133  BP: (!) 108/51 (!) 117/48  Pulse: 72 85  Resp: 16 15  Temp: 97.9 F (36.6 C) 99.4 F (37.4 C)  SpO2: 95% 98%    General: AAOx3 Cardiovascular: S1S2/RRR Respiratory: CTAB  Discharge Instructions   Discharge Instructions    Diet - low sodium heart healthy    Complete by:  As directed    Increase activity slowly    Complete by:  As directed      Current Discharge Medication List    START taking these medications   Details  aspirin 81 MG chewable tablet Chew 1 tablet (81 mg total) by mouth 2 (two) times daily with a meal. Qty: 60 tablet, Refills: 1    HYDROcodone-acetaminophen (NORCO/VICODIN) 5-325 MG tablet Take 1-2 tablets by mouth every 6 (six) hours as needed for moderate pain. Qty: 30 tablet, Refills: 0    senna (SENOKOT) 8.6 MG TABS tablet Take 1 tablet (8.6 mg total) by mouth daily as needed for mild constipation. Qty: 10 each, Refills: 0      CONTINUE these medications which have NOT CHANGED   Details  acetaminophen (TYLENOL) 325 MG tablet Take 2 tablets (650 mg total) by mouth every 6 (six) hours as needed. Qty: 60 tablet, Refills: 0    bifidobacterium infantis (ALIGN) capsule Take 1 capsule by mouth daily.   Associated Diagnoses: Malignant neoplasm of right breast in female, estrogen receptor positive, unspecified site of breast ([place]); Screening mammogram, encounter for  Cholecalciferol 1000 units TBDP Take 1,000 Units by mouth at bedtime.   Associated Diagnoses: Malignant neoplasm of right breast in female, estrogen receptor positive, unspecified site of breast ([place]); Screening mammogram, encounter for    dorzolamide-timolol (COSOPT) 22.3-6.8 MG/ML ophthalmic solution PLACE 1 DROP INTO BOTH EYES TWICE A DAY Refills: 12    escitalopram  (LEXAPRO) 20 MG tablet Take 20 mg by mouth daily. Refills: 12    hydroxychloroquine (PLAQUENIL) 200 MG tablet TAKE 1 TABLET TWICE A DAY ON MONDAY THROUGH FRIDAY Qty: 120 tablet, Refills: 1    latanoprost (XALATAN) 0.005 % ophthalmic solution Place 1 drop into both eyes at bedtime.     dorzolamide (TRUSOPT) 2 % ophthalmic solution Place 1 drop into the right eye 2 (two) times daily.      STOP taking these medications     aspirin 81 MG tablet      naproxen sodium (ANAPROX) 220 MG tablet      gabapentin (NEURONTIN) 100 MG capsule      tamoxifen (NOLVADEX) 10 MG tablet      Vitamin D, Ergocalciferol, (DRISDOL) 50000 units CAPS capsule        Allergies  Allergen Reactions  . Amoxicillin Hives  . Latex Itching    Contact information for follow-up providers    Swinteck, Aaron Edelman, MD. Schedule an appointment as soon as possible for a visit in 2 week(s).   Specialty:  Orthopedic Surgery Why:  For wound re-check Contact information: [place]. [address] [address] [phone]            Contact information for after-discharge care    Destination    [facility] Follow up.   Specialty:  [place] information: [address] [place] [phone]                   The results of significant diagnostics from this hospitalization (including imaging, microbiology, ancillary and laboratory) are listed below for reference.    Significant Diagnostic Studies: Pelvis Portable  Result Date: 03/22/2017 CLINICAL DATA:  Right femoral neck fracture. Status post right hip hemiarthroplasty. EXAM: PORTABLE PELVIS 1-2 VIEWS COMPARISON:  CT scan dated 03/21/2017 FINDINGS: Knee hemiarthroplasty prosthesis appears in excellent position in the AP projection. No fractures. Slight osteoarthritis of the left hip. IMPRESSION: Satisfactory appearance of the right hip in the AP  projection after hemiarthroplasty. Electronically Signed   By: Lorriane Shire M.D.   On: 03/22/2017 14:39   Ct Hip Right Wo Contrast  Result Date: 03/21/2017 CLINICAL DATA:  Fall onto right hip while walking with displaced fracture on x-rays. Possible acetabular fracture. EXAM: CT OF THE RIGHT HIP WITHOUT CONTRAST TECHNIQUE: Multidetector CT imaging of the right hip was performed according to the standard protocol. Multiplanar CT image reconstructions were also generated. COMPARISON:  Plain films earlier today.  CT 09/05/2011 FINDINGS: Bones/Joint/Cartilage Examination demonstrates evidence of patient's displaced subcapital right femoral neck fracture. There is no evidence of a mid acetabular fracture. No other fractures identified. Mild degenerative change of the right hip. Ligaments Suboptimally assessed by CT. Muscles and Tendons Unremarkable. Soft tissues Mild calcified plaque over they right external iliac artery. Diverticulosis of the colon. IMPRESSION: Displaced subcapital fracture of the right femoral neck. No acetabular fracture. Electronically Signed   By: Marin Olp M.D.   On: 03/21/2017 22:29   Dg Chest Port 1 View  Result Date: 03/22/2017 CLINICAL DATA:  Hip fracture, former smoker. EXAM: PORTABLE CHEST 1  VIEW COMPARISON:  07/10/2007. FINDINGS: Increased cardiomediastinal silhouette. Calcified tortuous aorta. No consolidation or edema. In the RIGHT upper lobe, overlying the ribs, and scapula, there is a possible 16 mm pulmonary nodule. Compared with previous study in 2009, a similar density is not seen although there is more confluence of bony shadows on today's study. CT chest with contrast recommended for further evaluation. Compared with previous exam, RIGHT breast implant has been removed. There is a RIGHT mastectomy. Pectus deformity is noted. Degenerative changes spine. Degenerative change both shoulders. Calcified LEFT axillary lymph nodes. Biapical pleural thickening. IMPRESSION:  Cardiomegaly without active infiltrates or failure. Cannot exclude 16 mm RIGHT upper lobe nodule. Recommend CT chest with contrast for further evaluation. These results will be called to the ordering clinician or representative by the Radiologist Assistant, and communication documented in the PACS or zVision Dashboard. Electronically Signed   By: Staci Righter M.D.   On: 03/22/2017 08:32   Dg C-arm 1-60 Min  Result Date: 03/22/2017 CLINICAL DATA:  Status post anterior approach right hip joint replacement. EXAM: DG C-ARM 61-120 MIN; OPERATIVE RIGHT HIP WITH PELVIS COMPARISON:  Preoperative examination of March 21, 2017 FINDINGS: Two fluoro spot images are reviewed. The reported fluoro time is 7 seconds. The patient has undergone right total hip joint prosthesis placement. Radiographic positioning of the prosthetic components is good. The interface with the native bone is normal. IMPRESSION: There is no acute postprocedure complication following right total hip joint prosthesis placement. Electronically Signed   By: David  Martinique M.D.   On: 03/22/2017 14:03   Dg Hip Operative Unilat W Or W/o Pelvis Right  Result Date: 03/22/2017 CLINICAL DATA:  Status post anterior approach right hip joint replacement. EXAM: DG C-ARM 61-120 MIN; OPERATIVE RIGHT HIP WITH PELVIS COMPARISON:  Preoperative examination of March 21, 2017 FINDINGS: Two fluoro spot images are reviewed. The reported fluoro time is 7 seconds. The patient has undergone right total hip joint prosthesis placement. Radiographic positioning of the prosthetic components is good. The interface with the native bone is normal. IMPRESSION: There is no acute postprocedure complication following right total hip joint prosthesis placement. Electronically Signed   By: David  Martinique M.D.   On: 03/22/2017 14:03   Dg Hip Unilat  With Pelvis 2-3 Views Right  Result Date: 03/21/2017 CLINICAL DATA:  Right hip pain post fall today. EXAM: DG HIP (WITH OR WITHOUT  PELVIS) 2-3V RIGHT COMPARISON:  None. FINDINGS: Diffuse osteopenia. Mild symmetric degenerative change of the hips. Displaced subcapital/transcervical fracture of the right femoral neck. Moderate degenerate change of the spine. IMPRESSION: Displaced right femoral neck fracture. Electronically Signed   By: Marin Olp M.D.   On: 03/21/2017 21:21   Dg Femur 1v Right  Result Date: 03/21/2017 CLINICAL DATA:  Fall today with right hip pain. EXAM: RIGHT FEMUR 1 VIEW COMPARISON:  None. FINDINGS: Examination demonstrates diffuse osteopenia. There mild degenerative changes of the right hip. There is a moderately displaced subcapital/transcervical fracture of the right femoral neck. Minimal focal irregularity over the mid acetabulum along the pelvic rim as would be difficult to exclude a fracture in this location as this was not appreciated on patient's right hip films. Evidence of previous right knee replacement with hardware intact. IMPRESSION: Displaced right femoral neck fracture. Cannot completely exclude a subtle fracture of the right mid acetabulum. Electronically Signed   By: Marin Olp M.D.   On: 03/21/2017 21:23    Microbiology: Recent Results (from the past 240 hour(s))  Surgical pcr screen  Status: None   Collection Time: 03/22/17 12:45 AM  Result Value Ref Range Status   MRSA, PCR NEGATIVE NEGATIVE Final   Staphylococcus aureus NEGATIVE NEGATIVE Final    Comment: (NOTE) The Xpert SA Assay (FDA approved for NASAL specimens in patients 41 years of age and older), is one component of a comprehensive surveillance program. It is not intended to diagnose infection nor to guide or monitor treatment.      Labs: Basic Metabolic Panel:  Recent Labs Lab 03/21/17 2113 03/22/17 0532 03/23/17 0434 03/24/17 0544  NA 138 138 136 135  K 3.9 3.7 3.9 3.3*  CL 104 104 101 103  CO2 25 26 27 28   GLUCOSE 112* 134* 130* 102*  BUN 24* 20 15 13   CREATININE 0.72 0.61 0.71 0.68  CALCIUM 8.6*  8.5* 8.2* 7.9*   Liver Function Tests: No results for input(s): AST, ALT, ALKPHOS, BILITOT, PROT, ALBUMIN in the last 168 hours. No results for input(s): LIPASE, AMYLASE in the last 168 hours. No results for input(s): AMMONIA in the last 168 hours. CBC:  Recent Labs Lab 03/21/17 2113 03/22/17 0532 03/23/17 0434 03/24/17 0544  WBC 6.2 7.9 9.3 7.4  NEUTROABS 4.3  --   --   --   HGB 13.0 13.0 11.2* 11.0*  HCT 39.6 39.8 34.1* 34.8*  MCV 87.0 88.6 89.0 89.2  PLT 211 194 171 172   Cardiac Enzymes: No results for input(s): CKTOTAL, CKMB, CKMBINDEX, TROPONINI in the last 168 hours. BNP: BNP (last 3 results) No results for input(s): BNP in the last 8760 hours.  ProBNP (last 3 results) No results for input(s): PROBNP in the last 8760 hours.  CBG: No results for input(s): GLUCAP in the last 168 hours.     SignedDomenic Polite MD.  [facility] 03/24/2017, 11:36 AM

## 2017-03-24 NOTE — Progress Notes (Signed)
Physical Therapy Treatment Patient Details Name: Dawn Diaz MRN: [id number] DOB: January 10, 1927 Today's Date: 03/24/2017    History of Present Illness Pt is an 83 y.o. female admitted post-fall; now s/p R THA (direct anterior approach) on 03/21/17. [facility]. Pertinent PMH includes osteporosis, IBS, TIAs, GERD, glaucoma, depression, breast CA, anxiety.     PT Comments    Pt performed gait but limited due to dizziness BP in L arm sitting post gait 109/54.  O2 sats decreased to 85% on RA and patient required 2L post session.  Plan next session to continue gait training and strengthening exercises.  Pt continues to benefit from skilled placement at SNF short term to improve strength and functional mobility before returning home.    Follow Up Recommendations  SNF;Supervision for mobility/OOB     Equipment Recommendations  Rolling walker with 5" wheels    Recommendations for Other Services       Precautions / Restrictions Precautions Precautions: Fall Restrictions Weight Bearing Restrictions: Yes RLE Weight Bearing: Weight bearing as tolerated    Mobility  Bed Mobility               General bed mobility comments: Received sitting in chair  Transfers Overall transfer level: Needs assistance Equipment used: Rolling walker (2 wheeled) Transfers: Sit to/from Stand Sit to Stand: Supervision;Min guard         General transfer comment: Stood with RW and min guard for balance; cues for hand placement and safe technique  Ambulation/Gait Ambulation/Gait assistance: Min guard Ambulation Distance (Feet): 80 Feet (reports dizziness after 40 ft of ambulation) Assistive device: Rolling walker (2 wheeled) Gait Pattern/deviations: Step-through pattern;Decreased stride length;Decreased weight shift to right;Antalgic Gait velocity: Decreased Gait velocity interpretation: Below normal speed for age/gender General Gait Details: Cues for  increasing stride length, maintaining RW position, improving gait symmetry. Pt 85% on RA post gait training required 2L of O2 to improve sats.     Stairs            Wheelchair Mobility    Modified Rankin (Stroke Patients Only)       Balance     Sitting balance-Leahy Scale: Good       Standing balance-Leahy Scale: Fair                              Cognition Arousal/Alertness: Awake/alert Behavior During Therapy: WFL for tasks assessed/performed Overall Cognitive Status: Within Functional Limits for tasks assessed                                        Exercises Total Joint Exercises Ankle Circles/Pumps: AROM;Both;10 reps;Supine Quad Sets: Right;10 reps;Supine Short Arc Quad: AROM;Right;10 reps;Supine Heel Slides: AAROM;Right;10 reps;Supine Hip ABduction/ADduction: AAROM;Right;10 reps;Supine Long Arc Quad: AROM;Right;10 reps;Supine    General Comments        Pertinent Vitals/Pain Pain Assessment: 0-10 Pain Score: 3  Pain Location: R hip Pain Descriptors / Indicators: Sore;Aching Pain Intervention(s): Monitored during session;Repositioned;Ice applied    Home Living                      Prior Function            PT Goals (current goals can now be found in the care plan section) Acute Rehab PT Goals Patient Stated Goal: [facility]  at [facility] prior to d/c back to [facility] Potential to Achieve Goals: Good Progress towards PT goals: Progressing toward goals    Frequency    7X/week      PT Plan Current plan remains appropriate    Co-evaluation              AM-PAC PT "6 Clicks" Daily Activity  Outcome Measure  Difficulty turning over in bed (including adjusting bedclothes, sheets and blankets)?: A Little Difficulty moving from lying on back to sitting on the side of the bed? : A Little Difficulty sitting down on and standing up from a chair with arms (e.g., wheelchair,  bedside commode, etc,.)?: A Little Help needed moving to and from a bed to chair (including a wheelchair)?: A Little Help needed walking in hospital room?: A Little Help needed climbing 3-5 steps with a railing? : A Lot 6 Click Score: 17    End of Session Equipment Utilized During Treatment: Gait belt Activity Tolerance: Patient tolerated treatment well Patient left: in chair;with call bell/phone within reach Nurse Communication: Mobility status PT Visit Diagnosis: Other abnormalities of gait and mobility (R26.89);Pain Pain - Right/Left: Right Pain - part of body: Hip     Time: 2409-7353 PT Time Calculation (min) (ACUTE ONLY): 18 min  Charges:  $Gait Training: 8-22 mins                    G Codes:       Governor Rooks, PTA pager [phone]    Cristela Blue 03/24/2017, 9:09 AM

## 2017-03-24 NOTE — Clinical Social Work Placement (Signed)
   CLINICAL SOCIAL WORK PLACEMENT  NOTE  Date:  03/24/2017  Patient Details  Name: Dawn Diaz MRN: [id number] Date of Birth: May 11, 1927  Clinical Social Work is seeking post-discharge placement for this patient at the [place] level of care (*CSW will initial, date and re-position this form in  chart as items are completed):  Yes   Patient/family provided with [place] Work Department's list of facilities offering this level of care within the geographic area requested by the patient (or if unable, by the patient's family).  Yes   Patient/family informed of their freedom to choose among providers that offer the needed level of care, that participate in Medicare, Medicaid or managed care program needed by the patient, have an available bed and are willing to accept the patient.  Yes   Patient/family informed of [facility]'s ownership interest in [facility] and [facility], as well as of the fact that they are under no obligation to receive care at these facilities.  PASRR submitted to EDS on       PASRR number received on       Existing PASRR number confirmed on 03/23/17     FL2 transmitted to all facilities in geographic area requested by pt/family on       FL2 transmitted to all facilities within larger geographic area on 03/23/17     Patient informed that his/her managed care company has contracts with or will negotiate with certain facilities, including the following:        Yes   Patient/family informed of bed offers received.  Patient chooses bed at [facility]     Physician recommends and patient chooses bed at      Patient to be transferred to [facility] on 03/24/17.  Patient to be transferred to facility by [facility]     Patient family notified on 03/24/17 of transfer.  Name of family member notified:  Pt resonsible for self     PHYSICIAN Please prepare priority discharge summary, including medications, Please prepare  prescriptions     Additional Comment:    _______________________________________________ Normajean Baxter, LCSW 03/24/2017, 10:50 AM

## 2017-03-27 DIAGNOSIS — M25551 Pain in right hip: Secondary | ICD-10-CM | POA: Diagnosis not present

## 2017-03-27 DIAGNOSIS — M62561 Muscle wasting and atrophy, not elsewhere classified, right lower leg: Secondary | ICD-10-CM | POA: Diagnosis not present

## 2017-03-27 DIAGNOSIS — M25571 Pain in right ankle and joints of right foot: Secondary | ICD-10-CM | POA: Diagnosis not present

## 2017-03-27 DIAGNOSIS — W101XXS Fall (on)(from) sidewalk curb, sequela: Secondary | ICD-10-CM | POA: Diagnosis not present

## 2017-03-27 DIAGNOSIS — S72091S Other fracture of head and neck of right femur, sequela: Secondary | ICD-10-CM | POA: Diagnosis not present

## 2017-03-27 DIAGNOSIS — Z4732 Aftercare following explantation of hip joint prosthesis: Secondary | ICD-10-CM | POA: Diagnosis not present

## 2017-03-27 DIAGNOSIS — M2041 Other hammer toe(s) (acquired), right foot: Secondary | ICD-10-CM | POA: Diagnosis not present

## 2017-03-27 DIAGNOSIS — R2689 Other abnormalities of gait and mobility: Secondary | ICD-10-CM | POA: Diagnosis not present

## 2017-03-27 DIAGNOSIS — R278 Other lack of coordination: Secondary | ICD-10-CM | POA: Diagnosis not present

## 2017-03-27 DIAGNOSIS — Z4789 Encounter for other orthopedic aftercare: Secondary | ICD-10-CM | POA: Diagnosis not present

## 2017-03-27 DIAGNOSIS — Z9181 History of falling: Secondary | ICD-10-CM | POA: Diagnosis not present

## 2017-03-27 DIAGNOSIS — R296 Repeated falls: Secondary | ICD-10-CM | POA: Diagnosis not present

## 2017-03-28 ENCOUNTER — Encounter: Payer: Self-pay | Admitting: Internal Medicine

## 2017-03-28 ENCOUNTER — Non-Acute Institutional Stay (SKILLED_NURSING_FACILITY): Payer: Medicare Other | Admitting: Internal Medicine

## 2017-03-28 DIAGNOSIS — M0609 Rheumatoid arthritis without rheumatoid factor, multiple sites: Secondary | ICD-10-CM

## 2017-03-28 DIAGNOSIS — K5900 Constipation, unspecified: Secondary | ICD-10-CM | POA: Diagnosis not present

## 2017-03-28 DIAGNOSIS — R911 Solitary pulmonary nodule: Secondary | ICD-10-CM | POA: Diagnosis not present

## 2017-03-28 DIAGNOSIS — M2170 Unequal limb length (acquired), unspecified site: Secondary | ICD-10-CM | POA: Insufficient documentation

## 2017-03-28 DIAGNOSIS — R278 Other lack of coordination: Secondary | ICD-10-CM | POA: Diagnosis not present

## 2017-03-28 DIAGNOSIS — R2689 Other abnormalities of gait and mobility: Secondary | ICD-10-CM | POA: Diagnosis not present

## 2017-03-28 DIAGNOSIS — M2041 Other hammer toe(s) (acquired), right foot: Secondary | ICD-10-CM | POA: Diagnosis not present

## 2017-03-28 DIAGNOSIS — S72001A Fracture of unspecified part of neck of right femur, initial encounter for closed fracture: Secondary | ICD-10-CM

## 2017-03-28 DIAGNOSIS — F321 Major depressive disorder, single episode, moderate: Secondary | ICD-10-CM | POA: Diagnosis not present

## 2017-03-28 DIAGNOSIS — Z853 Personal history of malignant neoplasm of breast: Secondary | ICD-10-CM

## 2017-03-28 DIAGNOSIS — R296 Repeated falls: Secondary | ICD-10-CM | POA: Diagnosis not present

## 2017-03-28 DIAGNOSIS — M62561 Muscle wasting and atrophy, not elsewhere classified, right lower leg: Secondary | ICD-10-CM | POA: Diagnosis not present

## 2017-03-28 DIAGNOSIS — M25551 Pain in right hip: Secondary | ICD-10-CM | POA: Diagnosis not present

## 2017-03-28 NOTE — Progress Notes (Signed)
Patient ID: Dawn Diaz, female   DOB: March 15, 1927, 83 y.o.   MRN: [id number]  Provider:  Rexene Edison. Mariea Clonts, D.O., C.M.D. Location:  [facility] Room Number: [place] of Service:  SNF (31)  PCP: Shon Baton, MD Patient Care Team: Shon Baton, MD as PCP - General (Internal Medicine)  Extended Emergency Contact Information Primary Emergency Contact: Otilio Carpen [place] Phone: [phone] Relation: Daughter  Code Status: Full Goals of Care: Advanced Directive information Advanced Directives 03/28/2017  Does Patient Have a Medical Advance Directive? Yes  Type of Advance Directive [place]  Does patient want to make changes to medical advance directive? -  Copy of [place] in Chart? Yes  Would patient like information on creating a medical advance directive? -  Pre-existing out of facility DNR order (yellow form or pink MOST form) -   Chief Complaint  Patient presents with  . New Admit To SNF    Rehab admission    HPI: Patient is a 83 y.o. female with h/o right breast cancer s/p mastectomy and had been on tamoxifen pre-hospitalization, seronegative RA seen today for admission to SNF following a fall resulting in a right femoral neck fracture sustained 03/21/17. She was hospitalized 10/2-10/5/18 and Dr. Lyla Glassing performed anterior right hemiarthroplasty 03/22/17.  She is to f/u in 2 weeks with him. She is on asa DVT prophylaxis for 4 wks.   She is being seen by PT for mobility. She is using a wheeled walker for mobility and is doing well. She states that her right leg seems a little longer than her left after the surgery but its not really bothering her. She says that she has not had to ask for pain medicine. She has tylenol and hydrocodone/apap available.  She says she feels like the hip and thigh are a little swollen but not bothering her. She does not recall the incident where her  oxygen level dropped last week while working with PT. Denies SOB and CP. CXR during hospitalization showed a lung nodule so she is to have a f/u CT chest in 1 month as an outpatient.    S/P right breast cancer with Mastectomy. No BP or labs in right arm. Was on Tamoxifen but this was stopped in hospital after the hip fx.  Will need outpatient reassessment.  Depression- Was on lexapro and doing well which needs to restart. Her las MD visit with Dr Virgina Jock indicated that her PHQ-9 was 1 that she feels hopeless at times. Does not indicate that this is the way she feels at this time.   Gabapentin (?RA-related neuropathy--pt does not recall ever taking) and vitamin D were also stopped during the hospitalization but she is now on daily vitamin D 1000 units.   She recalls possibly taking fosamax in the past, but has not recently been on osteoporosis medication.  She admits to having a "poor memory" and refused MMSE/clock drawing at admission.    Past Medical History:  Diagnosis Date  . Anxiety disorder   . Bacterial overgrowth syndrome   . Breast cancer, stage 1 [facility])    '97/recurrent '05 right breast(s/p mastectomy)  . Bunion   . Depression   . Diverticulitis   . GERD (gastroesophageal reflux disease)   . Glaucoma   . Hiatal hernia   . History of TIAs    - 13 yrs ago"brief memory loss"  . Irritable bowel syndrome   . Osteoporosis   .  Ovarian cyst    Past Surgical History:  Procedure Laterality Date  . ABDOMINAL HYSTERECTOMY    . ANTERIOR APPROACH HEMI HIP ARTHROPLASTY Right 03/22/2017   Procedure: ANTERIOR APPROACH HEMI HIP ARTHROPLASTY;  Surgeon: Rod Can, MD;  Location: [facility];  Service: Orthopedics;  Laterality: Right;  . BREAST BIOPSY     right  . BUNIONECTOMY Bilateral    both, repeat x1  . CATARACT EXTRACTION Left   . MASTECTOMY  1997   Right  . TONSILLECTOMY    . TOTAL KNEE ARTHROPLASTY Right 04/15/2013   Procedure: RIGHT TOTAL KNEE ARTHROPLASTY;  Surgeon: Gearlean Alf, MD;  Location: [facility];  Service: Orthopedics;  Laterality: Right;    reports that she quit smoking about 66 years ago. Her smoking use included Cigarettes. She quit after 3.00 years of use. She has never used smokeless tobacco. She reports that she does not drink alcohol or use drugs. Social History   Social History  . Marital status: Widowed    Spouse name: N/A  . Number of children: 3  . Years of education: N/A   Occupational History  . Retired Retired   Social History Main Topics  . Smoking status: Former Smoker    Years: 3.00    Types: Cigarettes    Quit date: 04/11/1951  . Smokeless tobacco: Never Used  . Alcohol use No  . Drug use: No  . Sexual activity: Not Currently   Other Topics Concern  . Not on file   Social History Narrative  . No narrative on file    Functional Status Survey: Assist with ambulation utilize wheeled walker. Able to feed and dress herself.    Family History  Problem Relation Age of Onset  . Colon cancer Sister 79       Died at 63  . Kidney cancer Father 57  . Pancreatic cancer Mother 33  . Pancreatic cancer Maternal Grandmother 62  . Cancer Paternal Grandfather        died from "abdominal ca" at young age  . Breast cancer Neg Hx     Health Maintenance  Topic Date Due  . TETANUS/TDAP  06/16/1948  . DEXA SCAN  06/16/1994  . PNA vac Low Risk Adult (1 of 2 - PCV13) 06/16/1994  . INFLUENZA VACCINE  01/18/2017    Allergies  Allergen Reactions  . Amoxicillin Hives  . Latex Itching    Outpatient Encounter Prescriptions as of 03/28/2017  Medication Sig  . acetaminophen (TYLENOL) 325 MG tablet Take 2 tablets (650 mg total) by mouth every 6 (six) hours as needed.  Marland Kitchen aspirin 81 MG chewable tablet Chew 1 tablet (81 mg total) by mouth 2 (two) times daily with a meal.  . bifidobacterium infantis (ALIGN) capsule Take 1 capsule by mouth daily.  . Cholecalciferol 1000 units TBDP Take 1,000 Units by mouth at bedtime.  . dorzolamide  (TRUSOPT) 2 % ophthalmic solution Place 1 drop into the right eye 2 (two) times daily.  . dorzolamide-timolol (COSOPT) 22.3-6.8 MG/ML ophthalmic solution PLACE 1 DROP INTO BOTH EYES TWICE A DAY  . escitalopram (LEXAPRO) 20 MG tablet Take 20 mg by mouth daily.  Marland Kitchen HYDROcodone-acetaminophen (NORCO/VICODIN) 5-325 MG tablet Take 1-2 tablets by mouth every 6 (six) hours as needed for moderate pain.  . hydroxychloroquine (PLAQUENIL) 200 MG tablet TAKE 1 TABLET TWICE A DAY ON MONDAY THROUGH FRIDAY  . latanoprost (XALATAN) 0.005 % ophthalmic solution Place 1 drop into both eyes at bedtime.   . senna (SENOKOT)  8.6 MG TABS tablet Take 1 tablet (8.6 mg total) by mouth daily as needed for mild constipation.   No facility-administered encounter medications on file as of 03/28/2017.     Review of Systems  Constitutional: Negative for appetite change, fatigue and unexpected weight change.  HENT: Negative for hearing loss and trouble swallowing.   Eyes:       Glaucoma  Respiratory: Negative for cough, chest tightness and shortness of breath.   Cardiovascular: Positive for leg swelling. Negative for chest pain.       Says her ankles have been prone to swelling over the last year but they are usually better by morning  Gastrointestinal: Negative for constipation and diarrhea.       Uses align to keep bowels in order  Genitourinary: Negative for difficulty urinating and frequency.  Musculoskeletal: Positive for arthralgias, gait problem and joint swelling.  Skin:       Bruising of right lateral and posterior leg and thigh  Neurological: Negative for dizziness and weakness.  Psychiatric/Behavioral: Negative for agitation, behavioral problems and sleep disturbance.    Vitals:   03/28/17 1040  BP: 97/65  Pulse: 89  Resp: 18  Temp: 98.1 F (36.7 C)  TempSrc: Oral  SpO2: 96%  Weight: 136 lb (61.7 kg)  Height: 5\' 7"  (1.702 m)   Body mass index is 21.3 kg/m. Physical Exam  Constitutional: She is  oriented to person, place, and time. Vital signs are normal. She appears well-developed and well-nourished. She is cooperative.  HENT:  Head: Normocephalic and atraumatic.  Eyes: Pupils are equal, round, and reactive to light. Conjunctivae are normal.  Neck: Normal range of motion. Neck supple.  Cardiovascular: Normal rate and regular rhythm.   Pulmonary/Chest: Effort normal and breath sounds normal. She has no wheezes. She has no rales.  Abdominal: Soft. Normal appearance and bowel sounds are normal. She exhibits no distension. There is no tenderness.  Musculoskeletal:       Right hip: She exhibits tenderness and swelling.  Right leg clearly longer than left now; using rolling walker  Neurological: She is alert and oriented to person, place, and time.  Skin:  Ecchymoses of posterior and lateral right leg/thigh; dressing is clean and dry, no erythema, warmth or drainage; right thigh is more swollen than left  Psychiatric: She has a normal mood and affect. Her behavior is normal.  Seems to have some trouble remembering some recent events. Refused MMSE    Labs reviewed: Basic Metabolic Panel:  Recent Labs  03/22/17 0532 03/23/17 0434 03/24/17 0544  NA 138 136 135  K 3.7 3.9 3.3*  CL 104 101 103  CO2 26 27 28   GLUCOSE 134* 130* 102*  BUN 20 15 13   CREATININE 0.61 0.71 0.68  CALCIUM 8.5* 8.2* 7.9*   Liver Function Tests:  Recent Labs  11/16/16 1019  AST 21  ALT 14  ALKPHOS 77  BILITOT 0.5  PROT 6.1  ALBUMIN 3.5*   No results for input(s): LIPASE, AMYLASE in the last 8760 hours. No results for input(s): AMMONIA in the last 8760 hours. CBC:  Recent Labs  11/16/16 1019 03/21/17 2113 03/22/17 0532 03/23/17 0434 03/24/17 0544  WBC 4.8 6.2 7.9 9.3 7.4  NEUTROABS 3,072 4.3  --   --   --   HGB 13.9 13.0 13.0 11.2* 11.0*  HCT 42.8 39.6 39.8 34.1* 34.8*  MCV 89.2 87.0 88.6 89.0 89.2  PLT 238 211 194 171 172   Cardiac Enzymes: No results for input(s):  CKTOTAL, CKMB,  CKMBINDEX, TROPONINI in the last 8760 hours. BNP: Invalid input(s): POCBNP No results found for: HGBA1C No results found for: TSH No results found for: VITAMINB12 No results found for: FOLATE No results found for: IRON, TIBC, FERRITIN  Imaging and Procedures obtained prior to SNF admission: Pelvis Portable  Result Date: 03/22/2017 CLINICAL DATA:  Right femoral neck fracture. Status post right hip hemiarthroplasty. EXAM: PORTABLE PELVIS 1-2 VIEWS COMPARISON:  CT scan dated 03/21/2017 FINDINGS: Knee hemiarthroplasty prosthesis appears in excellent position in the AP projection. No fractures. Slight osteoarthritis of the left hip. IMPRESSION: Satisfactory appearance of the right hip in the AP projection after hemiarthroplasty. Electronically Signed   By: Lorriane Shire M.D.   On: 03/22/2017 14:39   Ct Hip Right Wo Contrast  Result Date: 03/21/2017 CLINICAL DATA:  Fall onto right hip while walking with displaced fracture on x-rays. Possible acetabular fracture. EXAM: CT OF THE RIGHT HIP WITHOUT CONTRAST TECHNIQUE: Multidetector CT imaging of the right hip was performed according to the standard protocol. Multiplanar CT image reconstructions were also generated. COMPARISON:  Plain films earlier today.  CT 09/05/2011 FINDINGS: Bones/Joint/Cartilage Examination demonstrates evidence of patient's displaced subcapital right femoral neck fracture. There is no evidence of a mid acetabular fracture. No other fractures identified. Mild degenerative change of the right hip. Ligaments Suboptimally assessed by CT. Muscles and Tendons Unremarkable. Soft tissues Mild calcified plaque over they right external iliac artery. Diverticulosis of the colon. IMPRESSION: Displaced subcapital fracture of the right femoral neck. No acetabular fracture. Electronically Signed   By: Marin Olp M.D.   On: 03/21/2017 22:29   Dg Chest Port 1 View  Result Date: 03/22/2017 CLINICAL DATA:  Hip fracture, former smoker. EXAM:  PORTABLE CHEST 1 VIEW COMPARISON:  07/10/2007. FINDINGS: Increased cardiomediastinal silhouette. Calcified tortuous aorta. No consolidation or edema. In the RIGHT upper lobe, overlying the ribs, and scapula, there is a possible 16 mm pulmonary nodule. Compared with previous study in 2009, a similar density is not seen although there is more confluence of bony shadows on today's study. CT chest with contrast recommended for further evaluation. Compared with previous exam, RIGHT breast implant has been removed. There is a RIGHT mastectomy. Pectus deformity is noted. Degenerative changes spine. Degenerative change both shoulders. Calcified LEFT axillary lymph nodes. Biapical pleural thickening. IMPRESSION: Cardiomegaly without active infiltrates or failure. Cannot exclude 16 mm RIGHT upper lobe nodule. Recommend CT chest with contrast for further evaluation. These results will be called to the ordering clinician or representative by the Radiologist Assistant, and communication documented in the PACS or zVision Dashboard. Electronically Signed   By: Staci Righter M.D.   On: 03/22/2017 08:32   Dg C-arm 1-60 Min  Result Date: 03/22/2017 CLINICAL DATA:  Status post anterior approach right hip joint replacement. EXAM: DG C-ARM 61-120 MIN; OPERATIVE RIGHT HIP WITH PELVIS COMPARISON:  Preoperative examination of March 21, 2017 FINDINGS: Two fluoro spot images are reviewed. The reported fluoro time is 7 seconds. The patient has undergone right total hip joint prosthesis placement. Radiographic positioning of the prosthetic components is good. The interface with the native bone is normal. IMPRESSION: There is no acute postprocedure complication following right total hip joint prosthesis placement. Electronically Signed   By: David  Martinique M.D.   On: 03/22/2017 14:03   Dg Hip Operative Unilat W Or W/o Pelvis Right  Result Date: 03/22/2017 CLINICAL DATA:  Status post anterior approach right hip joint replacement. EXAM:  DG C-ARM 61-120 MIN; OPERATIVE RIGHT HIP  WITH PELVIS COMPARISON:  Preoperative examination of March 21, 2017 FINDINGS: Two fluoro spot images are reviewed. The reported fluoro time is 7 seconds. The patient has undergone right total hip joint prosthesis placement. Radiographic positioning of the prosthetic components is good. The interface with the native bone is normal. IMPRESSION: There is no acute postprocedure complication following right total hip joint prosthesis placement. Electronically Signed   By: David  Martinique M.D.   On: 03/22/2017 14:03   Dg Hip Unilat  With Pelvis 2-3 Views Right  Result Date: 03/21/2017 CLINICAL DATA:  Right hip pain post fall today. EXAM: DG HIP (WITH OR WITHOUT PELVIS) 2-3V RIGHT COMPARISON:  None. FINDINGS: Diffuse osteopenia. Mild symmetric degenerative change of the hips. Displaced subcapital/transcervical fracture of the right femoral neck. Moderate degenerate change of the spine. IMPRESSION: Displaced right femoral neck fracture. Electronically Signed   By: Marin Olp M.D.   On: 03/21/2017 21:21   Dg Femur 1v Right  Result Date: 03/21/2017 CLINICAL DATA:  Fall today with right hip pain. EXAM: RIGHT FEMUR 1 VIEW COMPARISON:  None. FINDINGS: Examination demonstrates diffuse osteopenia. There mild degenerative changes of the right hip. There is a moderately displaced subcapital/transcervical fracture of the right femoral neck. Minimal focal irregularity over the mid acetabulum along the pelvic rim as would be difficult to exclude a fracture in this location as this was not appreciated on patient's right hip films. Evidence of previous right knee replacement with hardware intact. IMPRESSION: Displaced right femoral neck fracture. Cannot completely exclude a subtle fracture of the right mid acetabulum. Electronically Signed   By: Marin Olp M.D.   On: 03/21/2017 21:23    Assessment/Plan 1. Closed fracture of right hip with routine healing, subsequent  encounter Continue with current PT and use of walker. Will follow up with orthopedics 10/17.  Complete 4 wks of asa bid  -may benefit from prolia or forteo therapy at this point -increase Vitamin D to 2000 units daily due to osteoporosis  2. History of Malignant neoplasm of right breast, stage 1, ER positive -has stopped her tamoxifen actually on her own -says she needs to f/u with Dr. Learta Codding outpatient and has not told him this -No BP or labs to be drawn from the right arm.   3. Depression, major, single episode, moderate (HCC) Previously on lexapro managed by Dr Russo--stopped at hospital and pt doing fine with spirits currently so I didn't restart. Refused MMSE on admission.   4. Rheumatoid arthritis, seronegative, multiple sites ([place]) -not on any meds for this at present -unclear if she took gabapentin for neuropathy related to this or some other cause (pt does not recall it at all)  5. Acquired leg length discrepancy -right leg longer now, may need a lift in her shoe  6. Constipation, unspecified constipation type -takes align to keep bowels in order, denies a recent problem  7. Pulmonary nodule seen on imaging study -CT scan recommended for future outpatient due to xray finding--will allow Dr. Virgina Jock to address after pt finishes her rehab   Family/ staff Communication: discussed with SNF nurse  Labs/tests ordered:  Cbc 10/11 to see if h/h trending up after surgery  Latrel Szymczak L. Kahliya Fraleigh, D.O. [facility] [address], [place] [address] Cell Phone (Mon-Fri 8am-5pm):  [phone] On Call:  [phone] & follow prompts after 5pm & weekends Office Phone:  [phone] Office Fax:  [phone]

## 2017-03-29 DIAGNOSIS — M2041 Other hammer toe(s) (acquired), right foot: Secondary | ICD-10-CM | POA: Diagnosis not present

## 2017-03-29 DIAGNOSIS — R278 Other lack of coordination: Secondary | ICD-10-CM | POA: Diagnosis not present

## 2017-03-29 DIAGNOSIS — R2689 Other abnormalities of gait and mobility: Secondary | ICD-10-CM | POA: Diagnosis not present

## 2017-03-29 DIAGNOSIS — R296 Repeated falls: Secondary | ICD-10-CM | POA: Diagnosis not present

## 2017-03-29 DIAGNOSIS — M62561 Muscle wasting and atrophy, not elsewhere classified, right lower leg: Secondary | ICD-10-CM | POA: Diagnosis not present

## 2017-03-29 DIAGNOSIS — M25551 Pain in right hip: Secondary | ICD-10-CM | POA: Diagnosis not present

## 2017-03-30 DIAGNOSIS — R296 Repeated falls: Secondary | ICD-10-CM | POA: Diagnosis not present

## 2017-03-30 DIAGNOSIS — M25551 Pain in right hip: Secondary | ICD-10-CM | POA: Diagnosis not present

## 2017-03-30 DIAGNOSIS — R278 Other lack of coordination: Secondary | ICD-10-CM | POA: Diagnosis not present

## 2017-03-30 DIAGNOSIS — D649 Anemia, unspecified: Secondary | ICD-10-CM | POA: Diagnosis not present

## 2017-03-30 DIAGNOSIS — Z79899 Other long term (current) drug therapy: Secondary | ICD-10-CM | POA: Diagnosis not present

## 2017-03-30 DIAGNOSIS — R2689 Other abnormalities of gait and mobility: Secondary | ICD-10-CM | POA: Diagnosis not present

## 2017-03-30 DIAGNOSIS — M2041 Other hammer toe(s) (acquired), right foot: Secondary | ICD-10-CM | POA: Diagnosis not present

## 2017-03-30 DIAGNOSIS — M62561 Muscle wasting and atrophy, not elsewhere classified, right lower leg: Secondary | ICD-10-CM | POA: Diagnosis not present

## 2017-03-31 DIAGNOSIS — M25551 Pain in right hip: Secondary | ICD-10-CM | POA: Diagnosis not present

## 2017-03-31 DIAGNOSIS — M62561 Muscle wasting and atrophy, not elsewhere classified, right lower leg: Secondary | ICD-10-CM | POA: Diagnosis not present

## 2017-03-31 DIAGNOSIS — M2041 Other hammer toe(s) (acquired), right foot: Secondary | ICD-10-CM | POA: Diagnosis not present

## 2017-03-31 DIAGNOSIS — R278 Other lack of coordination: Secondary | ICD-10-CM | POA: Diagnosis not present

## 2017-03-31 DIAGNOSIS — R296 Repeated falls: Secondary | ICD-10-CM | POA: Diagnosis not present

## 2017-03-31 DIAGNOSIS — R2689 Other abnormalities of gait and mobility: Secondary | ICD-10-CM | POA: Diagnosis not present

## 2017-04-01 DIAGNOSIS — M2041 Other hammer toe(s) (acquired), right foot: Secondary | ICD-10-CM | POA: Diagnosis not present

## 2017-04-01 DIAGNOSIS — M25551 Pain in right hip: Secondary | ICD-10-CM | POA: Diagnosis not present

## 2017-04-01 DIAGNOSIS — R278 Other lack of coordination: Secondary | ICD-10-CM | POA: Diagnosis not present

## 2017-04-01 DIAGNOSIS — M62561 Muscle wasting and atrophy, not elsewhere classified, right lower leg: Secondary | ICD-10-CM | POA: Diagnosis not present

## 2017-04-01 DIAGNOSIS — R2689 Other abnormalities of gait and mobility: Secondary | ICD-10-CM | POA: Diagnosis not present

## 2017-04-01 DIAGNOSIS — R296 Repeated falls: Secondary | ICD-10-CM | POA: Diagnosis not present

## 2017-04-02 DIAGNOSIS — M25551 Pain in right hip: Secondary | ICD-10-CM | POA: Diagnosis not present

## 2017-04-02 DIAGNOSIS — M62561 Muscle wasting and atrophy, not elsewhere classified, right lower leg: Secondary | ICD-10-CM | POA: Diagnosis not present

## 2017-04-02 DIAGNOSIS — R278 Other lack of coordination: Secondary | ICD-10-CM | POA: Diagnosis not present

## 2017-04-02 DIAGNOSIS — R296 Repeated falls: Secondary | ICD-10-CM | POA: Diagnosis not present

## 2017-04-02 DIAGNOSIS — R2689 Other abnormalities of gait and mobility: Secondary | ICD-10-CM | POA: Diagnosis not present

## 2017-04-02 DIAGNOSIS — M2041 Other hammer toe(s) (acquired), right foot: Secondary | ICD-10-CM | POA: Diagnosis not present

## 2017-04-03 DIAGNOSIS — M2041 Other hammer toe(s) (acquired), right foot: Secondary | ICD-10-CM | POA: Diagnosis not present

## 2017-04-03 DIAGNOSIS — M25551 Pain in right hip: Secondary | ICD-10-CM | POA: Diagnosis not present

## 2017-04-03 DIAGNOSIS — R278 Other lack of coordination: Secondary | ICD-10-CM | POA: Diagnosis not present

## 2017-04-03 DIAGNOSIS — R2689 Other abnormalities of gait and mobility: Secondary | ICD-10-CM | POA: Diagnosis not present

## 2017-04-03 DIAGNOSIS — R296 Repeated falls: Secondary | ICD-10-CM | POA: Diagnosis not present

## 2017-04-03 DIAGNOSIS — M62561 Muscle wasting and atrophy, not elsewhere classified, right lower leg: Secondary | ICD-10-CM | POA: Diagnosis not present

## 2017-04-04 ENCOUNTER — Other Ambulatory Visit: Payer: Self-pay | Admitting: Rheumatology

## 2017-04-04 DIAGNOSIS — R278 Other lack of coordination: Secondary | ICD-10-CM | POA: Diagnosis not present

## 2017-04-04 DIAGNOSIS — Z85828 Personal history of other malignant neoplasm of skin: Secondary | ICD-10-CM | POA: Diagnosis not present

## 2017-04-04 DIAGNOSIS — L814 Other melanin hyperpigmentation: Secondary | ICD-10-CM | POA: Diagnosis not present

## 2017-04-04 DIAGNOSIS — M25551 Pain in right hip: Secondary | ICD-10-CM | POA: Diagnosis not present

## 2017-04-04 DIAGNOSIS — M62561 Muscle wasting and atrophy, not elsewhere classified, right lower leg: Secondary | ICD-10-CM | POA: Diagnosis not present

## 2017-04-04 DIAGNOSIS — R296 Repeated falls: Secondary | ICD-10-CM | POA: Diagnosis not present

## 2017-04-04 DIAGNOSIS — R2689 Other abnormalities of gait and mobility: Secondary | ICD-10-CM | POA: Diagnosis not present

## 2017-04-04 DIAGNOSIS — M2041 Other hammer toe(s) (acquired), right foot: Secondary | ICD-10-CM | POA: Diagnosis not present

## 2017-04-04 DIAGNOSIS — D1801 Hemangioma of skin and subcutaneous tissue: Secondary | ICD-10-CM | POA: Diagnosis not present

## 2017-04-04 NOTE — Telephone Encounter (Signed)
Last visit: 11/16/16 Next visit: 04/18/17 Labs: 03/24/17 stable  Eye exam: 02/07/17   Ok to refill per Dr. Estanislado Pandy.

## 2017-04-05 DIAGNOSIS — R296 Repeated falls: Secondary | ICD-10-CM | POA: Diagnosis not present

## 2017-04-05 DIAGNOSIS — Z4789 Encounter for other orthopedic aftercare: Secondary | ICD-10-CM | POA: Diagnosis not present

## 2017-04-05 DIAGNOSIS — R2689 Other abnormalities of gait and mobility: Secondary | ICD-10-CM | POA: Diagnosis not present

## 2017-04-05 DIAGNOSIS — R278 Other lack of coordination: Secondary | ICD-10-CM | POA: Diagnosis not present

## 2017-04-05 DIAGNOSIS — M25551 Pain in right hip: Secondary | ICD-10-CM | POA: Diagnosis not present

## 2017-04-05 DIAGNOSIS — M62561 Muscle wasting and atrophy, not elsewhere classified, right lower leg: Secondary | ICD-10-CM | POA: Diagnosis not present

## 2017-04-05 DIAGNOSIS — M2041 Other hammer toe(s) (acquired), right foot: Secondary | ICD-10-CM | POA: Diagnosis not present

## 2017-04-05 NOTE — Progress Notes (Signed)
Office Visit Note  Patient: Dawn Diaz             Date of Birth: 05/16/1927           MRN: [id number]             PCP: Shon Baton, MD Referring: Shon Baton, MD Visit Date: 04/18/2017 Occupation: @GUAROCC @    Subjective:  Pain right knee   History of Present Illness: Dawn Diaz is a 83 y.o. female with history of seronegative rheumatoid arthritis. She denies any joint swelling. She had right total hip replacement in October 2018 from which she is recovering well. She had some strain on her right total knee replacement during the recovery and that's been bothersome to some extent. She's been tolerating Plaquenil well.  Activities of Daily Living:  Patient reports morning stiffness for 0 minute.   Patient Denies nocturnal pain.  Difficulty dressing/grooming: Denies Difficulty climbing stairs: Reports Difficulty getting out of chair: Reports Difficulty using hands for taps, buttons, cutlery, and/or writing: Denies   Review of Systems  Constitutional: Positive for fatigue. Negative for night sweats, weight gain, weight loss and weakness.  HENT: Positive for mouth dryness. Negative for mouth sores, trouble swallowing, trouble swallowing and nose dryness.   Eyes: Negative for pain, redness, visual disturbance and dryness.  Respiratory: Negative for cough, shortness of breath and difficulty breathing.   Cardiovascular: Negative for chest pain, palpitations, hypertension, irregular heartbeat and swelling in legs/feet.  Gastrointestinal: Negative for blood in stool, constipation and diarrhea.  Endocrine: Negative for increased urination.  Genitourinary: Negative for vaginal dryness.  Musculoskeletal: Positive for arthralgias, joint pain and morning stiffness. Negative for joint swelling, myalgias, muscle weakness, muscle tenderness and myalgias.  Skin: Negative for color change, rash, hair loss, skin tightness, ulcers and sensitivity to sunlight.  Allergic/Immunologic:  Negative for susceptible to infections.  Neurological: Negative for dizziness, memory loss and night sweats.  Hematological: Negative for swollen glands.  Psychiatric/Behavioral: Negative for depressed mood and sleep disturbance. The patient is not nervous/anxious.     PMFS History:  Patient Active Problem List   Diagnosis Date Noted  . Depression, major, single episode, moderate ([place]) 03/28/2017  . Pulmonary nodule seen on imaging study 03/28/2017  . Acquired leg length discrepancy 03/28/2017  . Closed displaced fracture of right femoral neck ([place]) 03/22/2017  . Breast cancer, stage 1 ([place])   . Rheumatoid arthritis, seronegative, multiple sites ([place]) 11/15/2016  . Osteopenia 11/15/2016  . Primary osteoarthritis of both hands 11/15/2016  . High risk medications (not anticoagulants) long-term use 11/15/2016  . Family history of malignant neoplasm of gastrointestinal tract 12/25/2013  . Constipation 04/24/2013  . Glaucoma 04/24/2013  . OA (osteoarthritis) of knee 04/15/2013  . History of breast cancer in female 12/20/2010  . DIVERTICULOSIS-COLON 08/06/2009  . IRRITABLE BOWEL SYNDROME 08/06/2009    Past Medical History:  Diagnosis Date  . Anxiety disorder   . Bacterial overgrowth syndrome   . Breast cancer, stage 1 [facility])    '97/recurrent '05 right breast(s/p mastectomy)  . Bunion   . Depression   . Diverticulitis   . GERD (gastroesophageal reflux disease)   . Glaucoma   . Hiatal hernia   . History of TIAs    - 13 yrs ago"brief memory loss"  . Irritable bowel syndrome   . Osteoporosis   . Ovarian cyst     Family History  Problem Relation Age of Onset  . Colon cancer Sister 21  Died at 72  . Kidney cancer Father 76  . Pancreatic cancer Mother 61  . Pancreatic cancer Maternal Grandmother 81  . Cancer Paternal Grandfather        died from "abdominal ca" at young age  . Breast cancer Neg Hx    Past Surgical History:  Procedure Laterality Date  . ABDOMINAL  HYSTERECTOMY    . ANTERIOR APPROACH HEMI HIP ARTHROPLASTY Right 03/22/2017   Procedure: ANTERIOR APPROACH HEMI HIP ARTHROPLASTY;  Surgeon: Rod Can, MD;  Location: [place];  Service: Orthopedics;  Laterality: Right;  . BREAST BIOPSY     right  . BUNIONECTOMY Bilateral    both, repeat x1  . CATARACT EXTRACTION Left   . HIP ARTHROPLASTY    . KNEE ARTHROPLASTY    . MASTECTOMY  1997   Right  . TONSILLECTOMY    . TOTAL KNEE ARTHROPLASTY Right 04/15/2013   Procedure: RIGHT TOTAL KNEE ARTHROPLASTY;  Surgeon: Gearlean Alf, MD;  Location: [facility];  Service: Orthopedics;  Laterality: Right;   Social History   Social History Narrative  . No narrative on file     Objective: Vital Signs: BP (!) 130/50 (BP Location: Left Arm, Patient Position: Sitting, Cuff Size: Normal)   Pulse 73   Resp 18   Ht 5\' 7"  (1.702 m)   Wt 133 lb (60.3 kg)   BMI 20.83 kg/m    Physical Exam  Constitutional: She is oriented to person, place, and time. She appears well-developed and well-nourished.  HENT:  Head: Normocephalic and atraumatic.  Eyes: Conjunctivae and EOM are normal.  Neck: Normal range of motion.  Cardiovascular: Normal rate, regular rhythm, normal heart sounds and intact distal pulses.   Pulmonary/Chest: Effort normal and breath sounds normal.  Abdominal: Soft. Bowel sounds are normal.  Lymphadenopathy:    She has no cervical adenopathy.  Neurological: She is alert and oriented to person, place, and time.  Skin: Skin is warm and dry. Capillary refill takes less than 2 seconds.  Psychiatric: She has a normal mood and affect. Her behavior is normal.  Nursing note and vitals reviewed.    Musculoskeletal Exam: C-spine some limitation with range of motion. Thoracic and lumbar spine good range of motion. Shoulder joints elbow joints wrist joints are good range of motion. Synovial thickening was noted over right first and second MCP joint and left second MCP joint. Some PIP DIP prominence was  noted. Hip joints knee joints ankles MTPs PIPs with good range of motion with no synovitis.  CDAI Exam: CDAI Homunculus Exam:   Joint Counts:  CDAI Tender Joint count: 0 CDAI Swollen Joint count: 0  Global Assessments:  Patient Global Assessment: 1 Provider Global Assessment: 1  CDAI Calculated Score: 2    Investigation: No additional findings.PLQ eye exam: 01/2017 CBC Latest Ref Rng & Units 03/24/2017 03/23/2017 03/22/2017  WBC 4.0 - 10.5 K/uL 7.4 9.3 7.9  Hemoglobin 12.0 - 15.0 g/dL 11.0(L) 11.2(L) 13.0  Hematocrit 36.0 - 46.0 % 34.8(L) 34.1(L) 39.8  Platelets 150 - 400 K/uL 172 171 194   CMP Latest Ref Rng & Units 03/24/2017 03/23/2017 03/22/2017  Glucose 65 - 99 mg/dL 102(H) 130(H) 134(H)  BUN 6 - 20 mg/dL 13 15 20   Creatinine 0.44 - 1.00 mg/dL 0.68 0.71 0.61  Sodium 135 - 145 mmol/L 135 136 138  Potassium 3.5 - 5.1 mmol/L 3.3(L) 3.9 3.7  Chloride 101 - 111 mmol/L 103 101 104  CO2 22 - 32 mmol/L 28 27 26   Calcium  8.9 - 10.3 mg/dL 7.9(L) 8.2(L) 8.5(L)  Total Protein 6.1 - 8.1 g/dL - - -  Total Bilirubin 0.2 - 1.2 mg/dL - - -  Alkaline Phos 33 - 130 U/L - - -  AST 10 - 35 U/L - - -  ALT 6 - 29 U/L - - -    Imaging: Ct Chest W Contrast  Result Date: 04/12/2017 CLINICAL DATA:  Possible 16 mm right upper lobe pulmonary nodule on a recent portable chest radiograph. EXAM: CT CHEST WITH CONTRAST TECHNIQUE: Multidetector CT imaging of the chest was performed during intravenous contrast administration. CONTRAST:  36mL ISOVUE-300 IOPAMIDOL (ISOVUE-300) INJECTION 61% COMPARISON:  Portable chest dated 03/22/2017 and chest CT dated 06/25/2008 and 05/31/2007. FINDINGS: Cardiovascular: Enlarged heart. Atheromatous arterial calcifications, including the thoracic aorta. Mediastinum/Nodes: 6 mm right lobe thyroid nodule. No enlarged lymph nodes. Lungs/Pleura: Biapical pleural and parenchymal scarring and pleural and parenchymal scarring in the lateral aspects of both upper lobes, without  significant change. This corresponds to the recently suspected 16 mm nodule on the right. There is a 4 mm nodule in the right upper lobe on image number 40 of series 3 without significant change since 05/31/2007 when differences in slice thickness are taken into account. This is currently seen on image number 40 series 3. A 5 mm subpleural nodule in the left upper lobe on image number 27 series 3 is unchanged since 05/31/2007 as well. No new lung nodules are seen. No pleural fluid. Upper Abdomen: Multiple right renal cysts.  Tiny left renal cyst. Musculoskeletal: Postmastectomy changes on the right. Thoracic spine degenerative changes and mild scoliosis. T6 vertebral hemangioma. No evidence of bony metastatic disease. IMPRESSION: 1. Small nodule in each upper lobe without significant change since 05/31/2007. The long-term stability is compatible with a benign process and these do not require further follow-up. 2. Stable bilateral upper lobe pleural and parenchymal scarring. In the right upper lobe, this corresponds to the recently suspected 16 mm nodule. 3. Calcified aortic atherosclerosis. Aortic Atherosclerosis (ICD10-I70.0). Electronically Signed   By: Claudie Revering M.D.   On: 04/12/2017 13:46   Pelvis Portable  Result Date: 03/22/2017 CLINICAL DATA:  Right femoral neck fracture. Status post right hip hemiarthroplasty. EXAM: PORTABLE PELVIS 1-2 VIEWS COMPARISON:  CT scan dated 03/21/2017 FINDINGS: Knee hemiarthroplasty prosthesis appears in excellent position in the AP projection. No fractures. Slight osteoarthritis of the left hip. IMPRESSION: Satisfactory appearance of the right hip in the AP projection after hemiarthroplasty. Electronically Signed   By: Lorriane Shire M.D.   On: 03/22/2017 14:39   Ct Hip Right Wo Contrast  Result Date: 03/21/2017 CLINICAL DATA:  Fall onto right hip while walking with displaced fracture on x-rays. Possible acetabular fracture. EXAM: CT OF THE RIGHT HIP WITHOUT CONTRAST  TECHNIQUE: Multidetector CT imaging of the right hip was performed according to the standard protocol. Multiplanar CT image reconstructions were also generated. COMPARISON:  Plain films earlier today.  CT 09/05/2011 FINDINGS: Bones/Joint/Cartilage Examination demonstrates evidence of patient's displaced subcapital right femoral neck fracture. There is no evidence of a mid acetabular fracture. No other fractures identified. Mild degenerative change of the right hip. Ligaments Suboptimally assessed by CT. Muscles and Tendons Unremarkable. Soft tissues Mild calcified plaque over they right external iliac artery. Diverticulosis of the colon. IMPRESSION: Displaced subcapital fracture of the right femoral neck. No acetabular fracture. Electronically Signed   By: Marin Olp M.D.   On: 03/21/2017 22:29   Dg Chest Port 1 View  Result Date: 03/22/2017 CLINICAL  DATA:  Hip fracture, former smoker. EXAM: PORTABLE CHEST 1 VIEW COMPARISON:  07/10/2007. FINDINGS: Increased cardiomediastinal silhouette. Calcified tortuous aorta. No consolidation or edema. In the RIGHT upper lobe, overlying the ribs, and scapula, there is a possible 16 mm pulmonary nodule. Compared with previous study in 2009, a similar density is not seen although there is more confluence of bony shadows on today's study. CT chest with contrast recommended for further evaluation. Compared with previous exam, RIGHT breast implant has been removed. There is a RIGHT mastectomy. Pectus deformity is noted. Degenerative changes spine. Degenerative change both shoulders. Calcified LEFT axillary lymph nodes. Biapical pleural thickening. IMPRESSION: Cardiomegaly without active infiltrates or failure. Cannot exclude 16 mm RIGHT upper lobe nodule. Recommend CT chest with contrast for further evaluation. These results will be called to the ordering clinician or representative by the Radiologist Assistant, and communication documented in the PACS or zVision Dashboard.  Electronically Signed   By: Staci Righter M.D.   On: 03/22/2017 08:32   Dg C-arm 1-60 Min  Result Date: 03/22/2017 CLINICAL DATA:  Status post anterior approach right hip joint replacement. EXAM: DG C-ARM 61-120 MIN; OPERATIVE RIGHT HIP WITH PELVIS COMPARISON:  Preoperative examination of March 21, 2017 FINDINGS: Two fluoro spot images are reviewed. The reported fluoro time is 7 seconds. The patient has undergone right total hip joint prosthesis placement. Radiographic positioning of the prosthetic components is good. The interface with the native bone is normal. IMPRESSION: There is no acute postprocedure complication following right total hip joint prosthesis placement. Electronically Signed   By: David  Martinique M.D.   On: 03/22/2017 14:03   Dg Hip Operative Unilat W Or W/o Pelvis Right  Result Date: 03/22/2017 CLINICAL DATA:  Status post anterior approach right hip joint replacement. EXAM: DG C-ARM 61-120 MIN; OPERATIVE RIGHT HIP WITH PELVIS COMPARISON:  Preoperative examination of March 21, 2017 FINDINGS: Two fluoro spot images are reviewed. The reported fluoro time is 7 seconds. The patient has undergone right total hip joint prosthesis placement. Radiographic positioning of the prosthetic components is good. The interface with the native bone is normal. IMPRESSION: There is no acute postprocedure complication following right total hip joint prosthesis placement. Electronically Signed   By: David  Martinique M.D.   On: 03/22/2017 14:03   Dg Hip Unilat  With Pelvis 2-3 Views Right  Result Date: 03/21/2017 CLINICAL DATA:  Right hip pain post fall today. EXAM: DG HIP (WITH OR WITHOUT PELVIS) 2-3V RIGHT COMPARISON:  None. FINDINGS: Diffuse osteopenia. Mild symmetric degenerative change of the hips. Displaced subcapital/transcervical fracture of the right femoral neck. Moderate degenerate change of the spine. IMPRESSION: Displaced right femoral neck fracture. Electronically Signed   By: Marin Olp M.D.    On: 03/21/2017 21:21   Dg Femur 1v Right  Result Date: 03/21/2017 CLINICAL DATA:  Fall today with right hip pain. EXAM: RIGHT FEMUR 1 VIEW COMPARISON:  None. FINDINGS: Examination demonstrates diffuse osteopenia. There mild degenerative changes of the right hip. There is a moderately displaced subcapital/transcervical fracture of the right femoral neck. Minimal focal irregularity over the mid acetabulum along the pelvic rim as would be difficult to exclude a fracture in this location as this was not appreciated on patient's right hip films. Evidence of previous right knee replacement with hardware intact. IMPRESSION: Displaced right femoral neck fracture. Cannot completely exclude a subtle fracture of the right mid acetabulum. Electronically Signed   By: Marin Olp M.D.   On: 03/21/2017 21:23    Speciality Comments: No  specialty comments available.    Procedures:  No procedures performed Allergies: Amoxicillin and Latex   Assessment / Plan:     Visit Diagnoses: Rheumatoid arthritis of multiple sites with negative rheumatoid factor (HCC) - seronegative . Patient with no synovitis on examination today she has synovial thickening over her MCP joints.  High risk medication use - PLQ 200 mg 1 tablet by mouth twice a day Monday through Fridayeye exam: 01/2017. Her labs are stable. We will continue to monitor her labs closely.  Primary osteoarthritis of both hands: Joint protection was discussed.  History of total right knee replacement - 03/21/2017 Dr. Lyla Glassing. She is gradually recovering from her total hip replacement. She has minimal discomfort.  DDD (degenerative disc disease), cervical: Some restriction with range of motion.  History of scoliosis  Osteopenia of multiple sites. I do not have any bone densities available on her. I've advised her to discuss this with Dr. Virgina Jock and get a repeat bone density.  History of vitamin D deficiency. I'll see her recent vitamin D level. I will  check her vitamin D level with her next labs.  Other medical problems are listed as follows:  History of diverticulosis  History of IBS  History of breast cancer  History of thyroiditis    Orders: No orders of the defined types were placed in this encounter.  No orders of the defined types were placed in this encounter.   Face-to-face time spent with patient was 30 minutes. Greater than 50% of time was spent in counseling and coordination of care.  Follow-Up Instructions: Return in about 5 months (around 09/16/2017) for Rheumatoid arthritis, Osteoarthritis.   Bo Merino, MD  Note - This record has been created using Editor, commissioning.  Chart creation errors have been sought, but may not always  have been located. Such creation errors do not reflect on  the standard of medical care.

## 2017-04-06 DIAGNOSIS — M25551 Pain in right hip: Secondary | ICD-10-CM | POA: Diagnosis not present

## 2017-04-06 DIAGNOSIS — R296 Repeated falls: Secondary | ICD-10-CM | POA: Diagnosis not present

## 2017-04-06 DIAGNOSIS — M2041 Other hammer toe(s) (acquired), right foot: Secondary | ICD-10-CM | POA: Diagnosis not present

## 2017-04-06 DIAGNOSIS — R278 Other lack of coordination: Secondary | ICD-10-CM | POA: Diagnosis not present

## 2017-04-06 DIAGNOSIS — M62561 Muscle wasting and atrophy, not elsewhere classified, right lower leg: Secondary | ICD-10-CM | POA: Diagnosis not present

## 2017-04-06 DIAGNOSIS — R2689 Other abnormalities of gait and mobility: Secondary | ICD-10-CM | POA: Diagnosis not present

## 2017-04-07 DIAGNOSIS — M2041 Other hammer toe(s) (acquired), right foot: Secondary | ICD-10-CM | POA: Diagnosis not present

## 2017-04-07 DIAGNOSIS — R278 Other lack of coordination: Secondary | ICD-10-CM | POA: Diagnosis not present

## 2017-04-07 DIAGNOSIS — R296 Repeated falls: Secondary | ICD-10-CM | POA: Diagnosis not present

## 2017-04-07 DIAGNOSIS — R2689 Other abnormalities of gait and mobility: Secondary | ICD-10-CM | POA: Diagnosis not present

## 2017-04-07 DIAGNOSIS — M62561 Muscle wasting and atrophy, not elsewhere classified, right lower leg: Secondary | ICD-10-CM | POA: Diagnosis not present

## 2017-04-07 DIAGNOSIS — M25551 Pain in right hip: Secondary | ICD-10-CM | POA: Diagnosis not present

## 2017-04-09 DIAGNOSIS — M25551 Pain in right hip: Secondary | ICD-10-CM | POA: Diagnosis not present

## 2017-04-09 DIAGNOSIS — R296 Repeated falls: Secondary | ICD-10-CM | POA: Diagnosis not present

## 2017-04-09 DIAGNOSIS — M2041 Other hammer toe(s) (acquired), right foot: Secondary | ICD-10-CM | POA: Diagnosis not present

## 2017-04-09 DIAGNOSIS — M62561 Muscle wasting and atrophy, not elsewhere classified, right lower leg: Secondary | ICD-10-CM | POA: Diagnosis not present

## 2017-04-09 DIAGNOSIS — R2689 Other abnormalities of gait and mobility: Secondary | ICD-10-CM | POA: Diagnosis not present

## 2017-04-09 DIAGNOSIS — R278 Other lack of coordination: Secondary | ICD-10-CM | POA: Diagnosis not present

## 2017-04-10 ENCOUNTER — Other Ambulatory Visit: Payer: Self-pay | Admitting: Adult Health

## 2017-04-10 DIAGNOSIS — R278 Other lack of coordination: Secondary | ICD-10-CM | POA: Diagnosis not present

## 2017-04-10 DIAGNOSIS — R911 Solitary pulmonary nodule: Secondary | ICD-10-CM

## 2017-04-10 DIAGNOSIS — R2689 Other abnormalities of gait and mobility: Secondary | ICD-10-CM | POA: Diagnosis not present

## 2017-04-10 DIAGNOSIS — M25551 Pain in right hip: Secondary | ICD-10-CM | POA: Diagnosis not present

## 2017-04-10 DIAGNOSIS — M62561 Muscle wasting and atrophy, not elsewhere classified, right lower leg: Secondary | ICD-10-CM | POA: Diagnosis not present

## 2017-04-10 DIAGNOSIS — M2041 Other hammer toe(s) (acquired), right foot: Secondary | ICD-10-CM | POA: Diagnosis not present

## 2017-04-10 DIAGNOSIS — R296 Repeated falls: Secondary | ICD-10-CM | POA: Diagnosis not present

## 2017-04-11 DIAGNOSIS — M25551 Pain in right hip: Secondary | ICD-10-CM | POA: Diagnosis not present

## 2017-04-11 DIAGNOSIS — R296 Repeated falls: Secondary | ICD-10-CM | POA: Diagnosis not present

## 2017-04-11 DIAGNOSIS — R2689 Other abnormalities of gait and mobility: Secondary | ICD-10-CM | POA: Diagnosis not present

## 2017-04-11 DIAGNOSIS — R278 Other lack of coordination: Secondary | ICD-10-CM | POA: Diagnosis not present

## 2017-04-11 DIAGNOSIS — M2041 Other hammer toe(s) (acquired), right foot: Secondary | ICD-10-CM | POA: Diagnosis not present

## 2017-04-11 DIAGNOSIS — M62561 Muscle wasting and atrophy, not elsewhere classified, right lower leg: Secondary | ICD-10-CM | POA: Diagnosis not present

## 2017-04-12 ENCOUNTER — Ambulatory Visit
Admission: RE | Admit: 2017-04-12 | Discharge: 2017-04-12 | Disposition: A | Discharge status: Home / Self Care | Payer: Medicare Other | Source: Ambulatory Visit | Attending: Adult Health | Admitting: Adult Health

## 2017-04-12 DIAGNOSIS — M62561 Muscle wasting and atrophy, not elsewhere classified, right lower leg: Secondary | ICD-10-CM | POA: Diagnosis not present

## 2017-04-12 DIAGNOSIS — R2689 Other abnormalities of gait and mobility: Secondary | ICD-10-CM | POA: Diagnosis not present

## 2017-04-12 DIAGNOSIS — R911 Solitary pulmonary nodule: Secondary | ICD-10-CM

## 2017-04-12 DIAGNOSIS — M2041 Other hammer toe(s) (acquired), right foot: Secondary | ICD-10-CM | POA: Diagnosis not present

## 2017-04-12 DIAGNOSIS — R278 Other lack of coordination: Secondary | ICD-10-CM | POA: Diagnosis not present

## 2017-04-12 DIAGNOSIS — M25551 Pain in right hip: Secondary | ICD-10-CM | POA: Diagnosis not present

## 2017-04-12 DIAGNOSIS — R296 Repeated falls: Secondary | ICD-10-CM | POA: Diagnosis not present

## 2017-04-12 MED ORDER — IOPAMIDOL (ISOVUE-300) INJECTION 61%
75.0000 mL | Freq: Once | INTRAVENOUS | Status: AC | PRN
  Administered 2017-04-12: 75 mL via INTRAVENOUS

## 2017-04-13 DIAGNOSIS — M2041 Other hammer toe(s) (acquired), right foot: Secondary | ICD-10-CM | POA: Diagnosis not present

## 2017-04-13 DIAGNOSIS — M62561 Muscle wasting and atrophy, not elsewhere classified, right lower leg: Secondary | ICD-10-CM | POA: Diagnosis not present

## 2017-04-13 DIAGNOSIS — R296 Repeated falls: Secondary | ICD-10-CM | POA: Diagnosis not present

## 2017-04-13 DIAGNOSIS — M25551 Pain in right hip: Secondary | ICD-10-CM | POA: Diagnosis not present

## 2017-04-13 DIAGNOSIS — R278 Other lack of coordination: Secondary | ICD-10-CM | POA: Diagnosis not present

## 2017-04-13 DIAGNOSIS — R2689 Other abnormalities of gait and mobility: Secondary | ICD-10-CM | POA: Diagnosis not present

## 2017-04-14 DIAGNOSIS — H409 Unspecified glaucoma: Secondary | ICD-10-CM | POA: Diagnosis not present

## 2017-04-14 DIAGNOSIS — I10 Essential (primary) hypertension: Secondary | ICD-10-CM | POA: Diagnosis not present

## 2017-04-14 DIAGNOSIS — S72009D Fracture of unspecified part of neck of unspecified femur, subsequent encounter for closed fracture with routine healing: Secondary | ICD-10-CM | POA: Diagnosis not present

## 2017-04-14 DIAGNOSIS — Z23 Encounter for immunization: Secondary | ICD-10-CM | POA: Diagnosis not present

## 2017-04-14 DIAGNOSIS — Z6820 Body mass index (BMI) 20.0-20.9, adult: Secondary | ICD-10-CM | POA: Diagnosis not present

## 2017-04-14 DIAGNOSIS — M0600 Rheumatoid arthritis without rheumatoid factor, unspecified site: Secondary | ICD-10-CM | POA: Diagnosis not present

## 2017-04-14 DIAGNOSIS — R911 Solitary pulmonary nodule: Secondary | ICD-10-CM | POA: Diagnosis not present

## 2017-04-14 DIAGNOSIS — C50919 Malignant neoplasm of unspecified site of unspecified female breast: Secondary | ICD-10-CM | POA: Diagnosis not present

## 2017-04-14 DIAGNOSIS — S72001A Fracture of unspecified part of neck of right femur, initial encounter for closed fracture: Secondary | ICD-10-CM | POA: Diagnosis not present

## 2017-04-18 ENCOUNTER — Encounter: Payer: Self-pay | Admitting: Rheumatology

## 2017-04-18 ENCOUNTER — Ambulatory Visit (INDEPENDENT_AMBULATORY_CARE_PROVIDER_SITE_OTHER): Payer: Medicare Other | Admitting: Rheumatology

## 2017-04-18 ENCOUNTER — Ambulatory Visit: Payer: Medicare Other | Admitting: Oncology

## 2017-04-18 ENCOUNTER — Ambulatory Visit: Payer: Medicare Other | Admitting: Rheumatology

## 2017-04-18 VITALS — BP 130/50 | HR 73 | Resp 18 | Ht 67.0 in | Wt 133.0 lb

## 2017-04-18 DIAGNOSIS — Z8739 Personal history of other diseases of the musculoskeletal system and connective tissue: Secondary | ICD-10-CM

## 2017-04-18 DIAGNOSIS — Z853 Personal history of malignant neoplasm of breast: Secondary | ICD-10-CM | POA: Diagnosis not present

## 2017-04-18 DIAGNOSIS — M5030 Other cervical disc degeneration, unspecified cervical region: Secondary | ICD-10-CM

## 2017-04-18 DIAGNOSIS — Z8639 Personal history of other endocrine, nutritional and metabolic disease: Secondary | ICD-10-CM | POA: Diagnosis not present

## 2017-04-18 DIAGNOSIS — Z96651 Presence of right artificial knee joint: Secondary | ICD-10-CM | POA: Diagnosis not present

## 2017-04-18 DIAGNOSIS — M0609 Rheumatoid arthritis without rheumatoid factor, multiple sites: Secondary | ICD-10-CM | POA: Diagnosis not present

## 2017-04-18 DIAGNOSIS — M19041 Primary osteoarthritis, right hand: Secondary | ICD-10-CM

## 2017-04-18 DIAGNOSIS — Z79899 Other long term (current) drug therapy: Secondary | ICD-10-CM | POA: Diagnosis not present

## 2017-04-18 DIAGNOSIS — Z8719 Personal history of other diseases of the digestive system: Secondary | ICD-10-CM | POA: Diagnosis not present

## 2017-04-18 DIAGNOSIS — M8589 Other specified disorders of bone density and structure, multiple sites: Secondary | ICD-10-CM

## 2017-04-18 DIAGNOSIS — M19042 Primary osteoarthritis, left hand: Secondary | ICD-10-CM | POA: Diagnosis not present

## 2017-04-18 NOTE — Patient Instructions (Signed)
Standing Labs We placed an order today for your standing lab work.    Please come back and get your standing labs in January and every 5 months  We have open lab Monday through Friday from 8:30-11:30 AM and 1:30-4 PM at the office of Dr. Bo Merino.   The office is located at [address] [place], [place], [place] [address] No appointment is necessary.   Labs are drawn by [facility].  You may receive a bill from [facility] for your lab work. If you have any questions regarding directions or hours of operation,  please call [phone].

## 2017-04-20 ENCOUNTER — Telehealth: Payer: Self-pay | Admitting: Oncology

## 2017-04-20 ENCOUNTER — Ambulatory Visit (HOSPITAL_BASED_OUTPATIENT_CLINIC_OR_DEPARTMENT_OTHER): Payer: Medicare Other | Admitting: Oncology

## 2017-04-20 ENCOUNTER — Telehealth: Payer: Self-pay | Admitting: *Deleted

## 2017-04-20 VITALS — BP 135/64 | HR 72 | Temp 98.5°F | Resp 18 | Ht 67.0 in | Wt 133.4 lb

## 2017-04-20 DIAGNOSIS — R911 Solitary pulmonary nodule: Secondary | ICD-10-CM | POA: Diagnosis not present

## 2017-04-20 DIAGNOSIS — W101XXS Fall (on)(from) sidewalk curb, sequela: Secondary | ICD-10-CM | POA: Diagnosis not present

## 2017-04-20 DIAGNOSIS — Z853 Personal history of malignant neoplasm of breast: Secondary | ICD-10-CM | POA: Diagnosis not present

## 2017-04-20 DIAGNOSIS — Z9181 History of falling: Secondary | ICD-10-CM | POA: Diagnosis not present

## 2017-04-20 DIAGNOSIS — Z4789 Encounter for other orthopedic aftercare: Secondary | ICD-10-CM | POA: Diagnosis not present

## 2017-04-20 DIAGNOSIS — C50911 Malignant neoplasm of unspecified site of right female breast: Secondary | ICD-10-CM

## 2017-04-20 DIAGNOSIS — M2041 Other hammer toe(s) (acquired), right foot: Secondary | ICD-10-CM | POA: Diagnosis not present

## 2017-04-20 DIAGNOSIS — M25551 Pain in right hip: Secondary | ICD-10-CM | POA: Diagnosis not present

## 2017-04-20 DIAGNOSIS — M069 Rheumatoid arthritis, unspecified: Secondary | ICD-10-CM | POA: Diagnosis not present

## 2017-04-20 DIAGNOSIS — R296 Repeated falls: Secondary | ICD-10-CM | POA: Diagnosis not present

## 2017-04-20 DIAGNOSIS — M25571 Pain in right ankle and joints of right foot: Secondary | ICD-10-CM | POA: Diagnosis not present

## 2017-04-20 DIAGNOSIS — Z809 Family history of malignant neoplasm, unspecified: Secondary | ICD-10-CM

## 2017-04-20 DIAGNOSIS — M62561 Muscle wasting and atrophy, not elsewhere classified, right lower leg: Secondary | ICD-10-CM | POA: Diagnosis not present

## 2017-04-20 DIAGNOSIS — Z86000 Personal history of in-situ neoplasm of breast: Secondary | ICD-10-CM | POA: Diagnosis not present

## 2017-04-20 DIAGNOSIS — R2689 Other abnormalities of gait and mobility: Secondary | ICD-10-CM | POA: Diagnosis not present

## 2017-04-20 DIAGNOSIS — S72091S Other fracture of head and neck of right femur, sequela: Secondary | ICD-10-CM | POA: Diagnosis not present

## 2017-04-20 DIAGNOSIS — Z170 Estrogen receptor positive status [ER+]: Principal | ICD-10-CM

## 2017-04-20 DIAGNOSIS — Z4732 Aftercare following explantation of hip joint prosthesis: Secondary | ICD-10-CM | POA: Diagnosis not present

## 2017-04-20 DIAGNOSIS — R278 Other lack of coordination: Secondary | ICD-10-CM | POA: Diagnosis not present

## 2017-04-20 NOTE — Telephone Encounter (Signed)
Scheduled appt per 1/11 los - Gave patient AVS and calender per los.  

## 2017-04-20 NOTE — Telephone Encounter (Signed)
Called pt re: missed appt. She thought her appt was scheduled for 3PM. Reviewed with Dr. Benay Spice: OK to come in at 3, will work her in. Pt voiced appreciation for call.

## 2017-04-20 NOTE — Progress Notes (Signed)
  [facility] OFFICE PROGRESS NOTE   Diagnosis: Breast cancer  INTERVAL HISTORY:   Ms. Connery returns as scheduled.  She fractured the right hip and underwent surgical repair 03/22/2017.  No change over the chest wall. She reports not taking tamoxifen for approximately 9 months.  A left mammogram was negative on 01/12/2017.  Objective:  Vital signs in last 24 hours:  Blood pressure 135/64, pulse 72, temperature 98.5 F (36.9 C), temperature source Oral, resp. rate 18, height _0  (1.702 m), weight 133 lb 6.4 oz (60.5 kg), SpO2 97 %.    HEENT: Neck without mass Lymphatics: No cervical, supraclavicular, or axillary nodes Resp: Lungs clear bilaterally Cardio: Regular rate and rhythm GI: No hepatomegaly Vascular: No leg edema Breast: Status post right mastectomy.  No evidence for chest wall tumor recurrence.  Left breast without mass.   Medications: I have reviewed the patient's current medications.  Assessment/Plan: 1. Extensive DCIS of the right breast diagnosed in 1997, status post a right mastectomy. 2. Chest wall recurrence with invasive breast cancer (ER positive, PR positive and HER-2/neu positive), status post removal of the right breast implant and a wide excision procedure in January 2009 with negative surgical margins. She began Arimidex in April 2009. She completed adjuvant radiation to the right chest wall. She was unable to tolerate the Arimidex due to arthralgias. She was switched to tamoxifen in July 2009. 3. Tiny lung nodule on a CT of the chest in December 2008 - likely a benign finding. 4. History of multiple thyroid nodules - followed by Dr Harlow Asa. 5. Extensive family history of cancer. Negative testing for BRCA1 and BRCA2 in 2015 6. Arthralgias secondary to Arimidex and Aromasin - she continues followup with Dr Estanislado Pandy for "arthritis." She reports being diagnosed with rheumatoid arthritis. There has been clinical improvement with  Plaquenil. 7. Status post right knee replacement October 2014   Disposition:  Dawn Diaz remains in clinical remission from breast cancer.  She has remained off of tamoxifen for approximately 9 months.  She would not resume tamoxifen.  She would like to continue follow-up at the Cancer center.  She will return for an office visit in 1 year.  She will obtain a left breast mammogram in July 2019.  She now lives in a retirement community.  She appears to have some difficulty with memory recall compared to when I saw her last year.  It would be reasonable to forego further mammography if her mental status declines.  15 minutes were spent with the patient today.  The majority of the time was used for counseling and coordination of care.  Donneta Romberg, MD  04/20/2017  3:42 PM

## 2017-04-24 DIAGNOSIS — R278 Other lack of coordination: Secondary | ICD-10-CM | POA: Diagnosis not present

## 2017-04-24 DIAGNOSIS — M2041 Other hammer toe(s) (acquired), right foot: Secondary | ICD-10-CM | POA: Diagnosis not present

## 2017-04-24 DIAGNOSIS — R296 Repeated falls: Secondary | ICD-10-CM | POA: Diagnosis not present

## 2017-04-24 DIAGNOSIS — M62561 Muscle wasting and atrophy, not elsewhere classified, right lower leg: Secondary | ICD-10-CM | POA: Diagnosis not present

## 2017-04-24 DIAGNOSIS — R2689 Other abnormalities of gait and mobility: Secondary | ICD-10-CM | POA: Diagnosis not present

## 2017-04-24 DIAGNOSIS — M25551 Pain in right hip: Secondary | ICD-10-CM | POA: Diagnosis not present

## 2017-04-27 DIAGNOSIS — L570 Actinic keratosis: Secondary | ICD-10-CM | POA: Diagnosis not present

## 2017-04-27 DIAGNOSIS — L821 Other seborrheic keratosis: Secondary | ICD-10-CM | POA: Diagnosis not present

## 2017-05-04 DIAGNOSIS — M25551 Pain in right hip: Secondary | ICD-10-CM | POA: Diagnosis not present

## 2017-05-04 DIAGNOSIS — M2041 Other hammer toe(s) (acquired), right foot: Secondary | ICD-10-CM | POA: Diagnosis not present

## 2017-05-04 DIAGNOSIS — R278 Other lack of coordination: Secondary | ICD-10-CM | POA: Diagnosis not present

## 2017-05-04 DIAGNOSIS — R296 Repeated falls: Secondary | ICD-10-CM | POA: Diagnosis not present

## 2017-05-04 DIAGNOSIS — M62561 Muscle wasting and atrophy, not elsewhere classified, right lower leg: Secondary | ICD-10-CM | POA: Diagnosis not present

## 2017-05-04 DIAGNOSIS — R2689 Other abnormalities of gait and mobility: Secondary | ICD-10-CM | POA: Diagnosis not present

## 2017-05-08 DIAGNOSIS — S72091D Other fracture of head and neck of right femur, subsequent encounter for closed fracture with routine healing: Secondary | ICD-10-CM | POA: Diagnosis not present

## 2017-05-08 DIAGNOSIS — Z4789 Encounter for other orthopedic aftercare: Secondary | ICD-10-CM | POA: Diagnosis not present

## 2017-05-15 DIAGNOSIS — R278 Other lack of coordination: Secondary | ICD-10-CM | POA: Diagnosis not present

## 2017-05-15 DIAGNOSIS — R296 Repeated falls: Secondary | ICD-10-CM | POA: Diagnosis not present

## 2017-05-15 DIAGNOSIS — M2041 Other hammer toe(s) (acquired), right foot: Secondary | ICD-10-CM | POA: Diagnosis not present

## 2017-05-15 DIAGNOSIS — M25551 Pain in right hip: Secondary | ICD-10-CM | POA: Diagnosis not present

## 2017-05-15 DIAGNOSIS — M62561 Muscle wasting and atrophy, not elsewhere classified, right lower leg: Secondary | ICD-10-CM | POA: Diagnosis not present

## 2017-05-15 DIAGNOSIS — R2689 Other abnormalities of gait and mobility: Secondary | ICD-10-CM | POA: Diagnosis not present

## 2017-05-18 DIAGNOSIS — Z961 Presence of intraocular lens: Secondary | ICD-10-CM | POA: Diagnosis not present

## 2017-05-18 DIAGNOSIS — H401113 Primary open-angle glaucoma, right eye, severe stage: Secondary | ICD-10-CM | POA: Diagnosis not present

## 2017-05-18 DIAGNOSIS — H401122 Primary open-angle glaucoma, left eye, moderate stage: Secondary | ICD-10-CM | POA: Diagnosis not present

## 2017-06-07 DIAGNOSIS — S72091D Other fracture of head and neck of right femur, subsequent encounter for closed fracture with routine healing: Secondary | ICD-10-CM | POA: Diagnosis not present

## 2017-06-07 DIAGNOSIS — M1712 Unilateral primary osteoarthritis, left knee: Secondary | ICD-10-CM | POA: Diagnosis not present

## 2017-06-16 ENCOUNTER — Other Ambulatory Visit: Payer: Self-pay | Admitting: Rheumatology

## 2017-06-16 NOTE — Telephone Encounter (Signed)
Last Visit: 04/18/17 Next Visit: 09/21/17 Labs : 03/24/17 Mild anemia, low calcium 7.9 an low potassium PCP aware PLQ Eye Exam: 02/07/17 WNL   Okay to refill per Dr. Estanislado Pandy

## 2017-06-22 DIAGNOSIS — H401122 Primary open-angle glaucoma, left eye, moderate stage: Secondary | ICD-10-CM | POA: Diagnosis not present

## 2017-06-22 DIAGNOSIS — H401113 Primary open-angle glaucoma, right eye, severe stage: Secondary | ICD-10-CM | POA: Diagnosis not present

## 2017-06-22 DIAGNOSIS — Z961 Presence of intraocular lens: Secondary | ICD-10-CM | POA: Diagnosis not present

## 2017-07-06 DIAGNOSIS — M1990 Unspecified osteoarthritis, unspecified site: Secondary | ICD-10-CM | POA: Diagnosis not present

## 2017-07-06 DIAGNOSIS — Z681 Body mass index (BMI) 19 or less, adult: Secondary | ICD-10-CM | POA: Diagnosis not present

## 2017-07-06 DIAGNOSIS — I10 Essential (primary) hypertension: Secondary | ICD-10-CM | POA: Diagnosis not present

## 2017-07-14 DIAGNOSIS — R911 Solitary pulmonary nodule: Secondary | ICD-10-CM | POA: Diagnosis not present

## 2017-07-14 DIAGNOSIS — I700 Atherosclerosis of aorta: Secondary | ICD-10-CM | POA: Diagnosis not present

## 2017-07-14 DIAGNOSIS — J479 Bronchiectasis, uncomplicated: Secondary | ICD-10-CM | POA: Diagnosis not present

## 2017-07-14 DIAGNOSIS — I10 Essential (primary) hypertension: Secondary | ICD-10-CM | POA: Diagnosis not present

## 2017-07-14 DIAGNOSIS — M0600 Rheumatoid arthritis without rheumatoid factor, unspecified site: Secondary | ICD-10-CM | POA: Diagnosis not present

## 2017-07-14 DIAGNOSIS — H4089 Other specified glaucoma: Secondary | ICD-10-CM | POA: Diagnosis not present

## 2017-07-14 DIAGNOSIS — C50919 Malignant neoplasm of unspecified site of unspecified female breast: Secondary | ICD-10-CM | POA: Diagnosis not present

## 2017-07-14 DIAGNOSIS — Z6820 Body mass index (BMI) 20.0-20.9, adult: Secondary | ICD-10-CM | POA: Diagnosis not present

## 2017-07-14 DIAGNOSIS — M2040 Other hammer toe(s) (acquired), unspecified foot: Secondary | ICD-10-CM | POA: Diagnosis not present

## 2017-07-14 DIAGNOSIS — F325 Major depressive disorder, single episode, in full remission: Secondary | ICD-10-CM | POA: Diagnosis not present

## 2017-07-14 DIAGNOSIS — D899 Disorder involving the immune mechanism, unspecified: Secondary | ICD-10-CM | POA: Diagnosis not present

## 2017-08-10 DIAGNOSIS — I781 Nevus, non-neoplastic: Secondary | ICD-10-CM | POA: Diagnosis not present

## 2017-08-10 DIAGNOSIS — C4401 Basal cell carcinoma of skin of lip: Secondary | ICD-10-CM | POA: Diagnosis not present

## 2017-08-10 DIAGNOSIS — Z23 Encounter for immunization: Secondary | ICD-10-CM | POA: Diagnosis not present

## 2017-08-10 DIAGNOSIS — L570 Actinic keratosis: Secondary | ICD-10-CM | POA: Diagnosis not present

## 2017-08-10 DIAGNOSIS — D485 Neoplasm of uncertain behavior of skin: Secondary | ICD-10-CM | POA: Diagnosis not present

## 2017-08-10 DIAGNOSIS — L821 Other seborrheic keratosis: Secondary | ICD-10-CM | POA: Diagnosis not present

## 2017-08-10 DIAGNOSIS — N760 Acute vaginitis: Secondary | ICD-10-CM | POA: Diagnosis not present

## 2017-08-22 ENCOUNTER — Other Ambulatory Visit: Payer: Self-pay | Admitting: Rheumatology

## 2017-08-22 DIAGNOSIS — M1712 Unilateral primary osteoarthritis, left knee: Secondary | ICD-10-CM | POA: Diagnosis not present

## 2017-08-22 DIAGNOSIS — M179 Osteoarthritis of knee, unspecified: Secondary | ICD-10-CM | POA: Diagnosis not present

## 2017-08-22 NOTE — Telephone Encounter (Signed)
Last Visit: 04/18/17 Next Visit: 09/21/17 Labs : 03/24/17 Mild anemia, low calcium 7.9 an low potassium PCP aware PLQ Eye Exam: 02/07/17 WNL   Okay to refill per Dr. Estanislado Pandy

## 2017-08-31 DIAGNOSIS — R51 Headache: Secondary | ICD-10-CM | POA: Diagnosis not present

## 2017-08-31 DIAGNOSIS — J019 Acute sinusitis, unspecified: Secondary | ICD-10-CM | POA: Diagnosis not present

## 2017-08-31 DIAGNOSIS — I10 Essential (primary) hypertension: Secondary | ICD-10-CM | POA: Diagnosis not present

## 2017-08-31 DIAGNOSIS — Z6821 Body mass index (BMI) 21.0-21.9, adult: Secondary | ICD-10-CM | POA: Diagnosis not present

## 2017-09-02 ENCOUNTER — Emergency Department (HOSPITAL_COMMUNITY)
Admission: EM | Admit: 2017-09-02 | Discharge: 2017-09-02 | Disposition: A | Discharge status: Home / Self Care | Payer: Medicare Other | Attending: Emergency Medicine | Admitting: Emergency Medicine

## 2017-09-02 ENCOUNTER — Emergency Department (HOSPITAL_COMMUNITY): Payer: Medicare Other

## 2017-09-02 DIAGNOSIS — S064X0A Epidural hemorrhage without loss of consciousness, initial encounter: Secondary | ICD-10-CM | POA: Diagnosis not present

## 2017-09-02 DIAGNOSIS — Y999 Unspecified external cause status: Secondary | ICD-10-CM | POA: Diagnosis not present

## 2017-09-02 DIAGNOSIS — Y939 Activity, unspecified: Secondary | ICD-10-CM | POA: Insufficient documentation

## 2017-09-02 DIAGNOSIS — S0990XA Unspecified injury of head, initial encounter: Secondary | ICD-10-CM

## 2017-09-02 DIAGNOSIS — S0003XA Contusion of scalp, initial encounter: Secondary | ICD-10-CM | POA: Diagnosis not present

## 2017-09-02 DIAGNOSIS — Z79899 Other long term (current) drug therapy: Secondary | ICD-10-CM | POA: Insufficient documentation

## 2017-09-02 DIAGNOSIS — Z7982 Long term (current) use of aspirin: Secondary | ICD-10-CM | POA: Insufficient documentation

## 2017-09-02 DIAGNOSIS — Z853 Personal history of malignant neoplasm of breast: Secondary | ICD-10-CM | POA: Insufficient documentation

## 2017-09-02 DIAGNOSIS — Z96651 Presence of right artificial knee joint: Secondary | ICD-10-CM | POA: Diagnosis not present

## 2017-09-02 DIAGNOSIS — Z96642 Presence of left artificial hip joint: Secondary | ICD-10-CM | POA: Diagnosis not present

## 2017-09-02 DIAGNOSIS — S0101XA Laceration without foreign body of scalp, initial encounter: Secondary | ICD-10-CM | POA: Diagnosis not present

## 2017-09-02 DIAGNOSIS — Y929 Unspecified place or not applicable: Secondary | ICD-10-CM | POA: Insufficient documentation

## 2017-09-02 DIAGNOSIS — W0110XA Fall on same level from slipping, tripping and stumbling with subsequent striking against unspecified object, initial encounter: Secondary | ICD-10-CM | POA: Insufficient documentation

## 2017-09-02 DIAGNOSIS — S199XXA Unspecified injury of neck, initial encounter: Secondary | ICD-10-CM | POA: Diagnosis not present

## 2017-09-02 DIAGNOSIS — Z87891 Personal history of nicotine dependence: Secondary | ICD-10-CM | POA: Diagnosis not present

## 2017-09-02 DIAGNOSIS — S098XXA Other specified injuries of head, initial encounter: Secondary | ICD-10-CM | POA: Diagnosis not present

## 2017-09-02 LAB — URINALYSIS, ROUTINE W REFLEX MICROSCOPIC
Bacteria, UA: NONE SEEN
Bilirubin Urine: NEGATIVE
Glucose, UA: NEGATIVE mg/dL
HGB URINE DIPSTICK: NEGATIVE
Ketones, ur: NEGATIVE mg/dL
LEUKOCYTES UA: NEGATIVE
Nitrite: NEGATIVE
Protein, ur: NEGATIVE mg/dL
Specific Gravity, Urine: 1.02 (ref 1.005–1.030)
pH: 5 (ref 5.0–8.0)

## 2017-09-02 MED ORDER — ACETAMINOPHEN 325 MG PO TABS
650.0000 mg | ORAL_TABLET | Freq: Once | ORAL | Status: AC
  Administered 2017-09-02: 650 mg via ORAL
  Filled 2017-09-02: qty 2

## 2017-09-02 NOTE — ED Triage Notes (Signed)
Brought in by EMS from [facility] complain of fall. Per EMS pt fell in the dining room, hit her head in the floor.  Patient c/o headache. Hematoma noted in the back of her head.

## 2017-09-02 NOTE — Discharge Instructions (Signed)
Take Tylenol for pain.  Apply ice pack several times a day.  Follow-up with family doctor as needed.  Return if worsening headache, vomiting, blurred vision, confusion, any new concerning symptoms

## 2017-09-02 NOTE — ED Provider Notes (Signed)
[facility] DEPT Provider Note   CSN: [id number] Arrival date & time: 09/02/17  2011     History   Chief Complaint Chief Complaint  Patient presents with  . Fall    HPI Dawn Diaz is a 84 y.o. female.  HPI Dawn Diaz is a 84 y.o. female presents emergency department complaint of a fall.  Patient states she was done eating, patient started walking to go back to her place, when she states she "lost my balance" and fell backwards hitting her head on the ground.  She denies feeling dizziness or lightheadedness prior to the fall.  No loss of consciousness.  Denies any palpitations.  No chest pain.  No shortness of breath.  She states "I fall a lot, I am just not ready on my feet."  Complaining of a headache and pain in the back of the head.  She denies any other injuries.  She was able to get up and walk.  No other complaints at this time.  Past Medical History:  Diagnosis Date  . Anxiety disorder   . Bacterial overgrowth syndrome   . Breast cancer, stage 1 [facility])    '97/recurrent '05 right breast(s/p mastectomy)  . Bunion   . Depression   . Diverticulitis   . GERD (gastroesophageal reflux disease)   . Glaucoma   . Hiatal hernia   . History of TIAs    - 13 yrs ago"brief memory loss"  . Irritable bowel syndrome   . Osteoporosis   . Ovarian cyst     Patient Active Problem List   Diagnosis Date Noted  . Depression, major, single episode, moderate ([place]) 03/28/2017  . Pulmonary nodule seen on imaging study 03/28/2017  . Acquired leg length discrepancy 03/28/2017  . Closed displaced fracture of right femoral neck ([place]) 03/22/2017  . Breast cancer, stage 1 ([place])   . Rheumatoid arthritis, seronegative, multiple sites ([place]) 11/15/2016  . Osteopenia 11/15/2016  . Primary osteoarthritis of both hands 11/15/2016  . High risk medications (not anticoagulants) long-term use 11/15/2016  . Family history of malignant neoplasm of gastrointestinal  tract 12/25/2013  . Constipation 04/24/2013  . Glaucoma 04/24/2013  . OA (osteoarthritis) of knee 04/15/2013  . History of breast cancer in female 12/20/2010  . DIVERTICULOSIS-COLON 08/06/2009  . IRRITABLE BOWEL SYNDROME 08/06/2009    Past Surgical History:  Procedure Laterality Date  . ABDOMINAL HYSTERECTOMY    . ANTERIOR APPROACH HEMI HIP ARTHROPLASTY Right 03/22/2017   Procedure: ANTERIOR APPROACH HEMI HIP ARTHROPLASTY;  Surgeon: Rod Can, MD;  Location: [facility];  Service: Orthopedics;  Laterality: Right;  . BREAST BIOPSY     right  . BUNIONECTOMY Bilateral    both, repeat x1  . CATARACT EXTRACTION Left   . HIP ARTHROPLASTY    . KNEE ARTHROPLASTY    . MASTECTOMY  1997   Right  . TONSILLECTOMY    . TOTAL KNEE ARTHROPLASTY Right 04/15/2013   Procedure: RIGHT TOTAL KNEE ARTHROPLASTY;  Surgeon: Gearlean Alf, MD;  Location: [facility];  Service: Orthopedics;  Laterality: Right;    OB History    No data available       Home Medications    Prior to Admission medications   Medication Sig Start Date End Date Taking? Authorizing Provider  acetaminophen (TYLENOL) 325 MG tablet Take 2 tablets (650 mg total) by mouth every 6 (six) hours as needed. Patient not taking: Reported on 04/18/2017 04/18/13   Dara Lords, Alexzandrew L, PA-C  ALPHAGAN P  0.1 % SOLN PLACE 1 DROP INTO RIGHT EYE TWICE A DAY 03/21/17   [provider]  aspirin 81 MG chewable tablet Chew 1 tablet (81 mg total) by mouth 2 (two) times daily with a meal. 03/23/17   Swinteck, Aaron Edelman, MD  bifidobacterium infantis (ALIGN) capsule Take 1 capsule by mouth daily.    [provider]  Cholecalciferol 1000 units TBDP Take 1,000 Units by mouth at bedtime.    [provider]  dorzolamide (TRUSOPT) 2 % ophthalmic solution Place 1 drop into the right eye 2 (two) times daily.    [provider]  dorzolamide-timolol (COSOPT) 22.3-6.8 MG/ML ophthalmic solution PLACE 1 DROP INTO BOTH EYES TWICE A DAY  09/06/16   [provider]  escitalopram (LEXAPRO) 20 MG tablet Take 20 mg by mouth daily. 10/25/16   [provider]  HYDROcodone-acetaminophen (NORCO/VICODIN) 5-325 MG tablet Take 1-2 tablets by mouth every 6 (six) hours as needed for moderate pain. 03/23/17   Swinteck, Aaron Edelman, MD  hydroxychloroquine (PLAQUENIL) 200 MG tablet TAKE 1 TABLET TWICE A DAY ON MONDAY THROUGH FRIDAY 08/22/17   Deveshwar, Abel Presto, MD  latanoprost (XALATAN) 0.005 % ophthalmic solution Place 1 drop into both eyes at bedtime.  06/18/11   [provider]  senna (SENOKOT) 8.6 MG TABS tablet Take 1 tablet (8.6 mg total) by mouth daily as needed for mild constipation. Patient not taking: Reported on 04/18/2017 03/24/17   Domenic Polite, MD    Family History Family History  Problem Relation Age of Onset  . Colon cancer Sister 31       Died at 61  . Kidney cancer Father 65  . Pancreatic cancer Mother 83  . Pancreatic cancer Maternal Grandmother 30  . Cancer Paternal Grandfather        died from "abdominal ca" at young age  . Breast cancer Neg Hx     Social History Social History   Tobacco Use  . Smoking status: Former Smoker    Years: 3.00    Types: Cigarettes    Last attempt to quit: 04/11/1951    Years since quitting: 66.4  . Smokeless tobacco: Never Used  Substance Use Topics  . Alcohol use: No  . Drug use: No     Allergies   Amoxicillin and Latex   Review of Systems Review of Systems  Constitutional: Negative for chills and fever.  Respiratory: Negative for cough, chest tightness and shortness of breath.   Cardiovascular: Negative for chest pain, palpitations and leg swelling.  Gastrointestinal: Negative for abdominal pain, diarrhea, nausea and vomiting.  Genitourinary: Negative for dysuria, flank pain, pelvic pain, vaginal bleeding, vaginal discharge and vaginal pain.  Musculoskeletal: Negative for arthralgias, myalgias, neck pain and neck stiffness.  Skin: Negative for  rash.  Neurological: Positive for headaches. Negative for dizziness and weakness.  All other systems reviewed and are negative.    Physical Exam Updated Vital Signs BP (!) 159/66 (BP Location: Left Arm)   Pulse 78   Temp 98.5 F (36.9 C) (Oral)   Resp 14   Ht 5\' 7"  (1.702 m)   Wt 59 kg (130 lb)   SpO2 96%   BMI 20.36 kg/m   Physical Exam  Constitutional: She is oriented to person, place, and time. She appears well-developed and well-nourished. No distress.  HENT:  Head: Normocephalic.  Large hematoma to the back of the head  Eyes: Conjunctivae and EOM are normal. Pupils are equal, round, and reactive to light.  Neck: Neck supple.  Cardiovascular: Normal rate, regular rhythm and normal heart sounds.  Pulmonary/Chest: Effort normal and breath sounds normal. No respiratory distress. She has no wheezes. She has no rales.  Abdominal: Soft. Bowel sounds are normal. She exhibits no distension. There is no tenderness. There is no rebound.  Musculoskeletal: She exhibits no edema.  Full range of motion of bilateral upper and lower extremities.  Pelvis is stable.  No midline cervical, thoracic, lumbar spine tenderness  Neurological: She is alert and oriented to person, place, and time.  5/5 and equal upper and lower extremity strength bilaterally. Equal grip strength bilaterally. Normal finger to nose and heel to shin. No pronator drift.  Gait is normal  Skin: Skin is warm and dry.  Psychiatric: She has a normal mood and affect. Her behavior is normal.  Nursing note and vitals reviewed.    ED Treatments / Results  Labs (all labs ordered are listed, but only abnormal results are displayed) Labs Reviewed  URINALYSIS, ROUTINE W REFLEX MICROSCOPIC - Abnormal; Notable for the following components:      Result Value   Squamous Epithelial / LPF 0-5 (*)    All other components within normal limits    EKG  EKG Interpretation  Date/Time:  Saturday September 02 2017 21:14:26  EDT Ventricular Rate:  83 PR Interval:    QRS Duration: 80 QT Interval:  387 QTC Calculation: 455 R Axis:   65 Text Interpretation:  Sinus rhythm Minimal ST depression, lateral leads No significant change since last tracing Confirmed by Wandra Arthurs [phone]) on 09/02/2017 10:04:40 PM       Radiology Ct Head Wo Contrast  Result Date: 09/02/2017 CLINICAL DATA:  Fall.  Posterior head injury. EXAM: CT HEAD WITHOUT CONTRAST CT CERVICAL SPINE WITHOUT CONTRAST TECHNIQUE: Multidetector CT imaging of the head and cervical spine was performed following the standard protocol without intravenous contrast. Multiplanar CT image reconstructions of the cervical spine were also generated. COMPARISON:  09/08/2014 head and cervical spine CT. FINDINGS: CT HEAD FINDINGS Brain: No evidence of parenchymal hemorrhage or extra-axial fluid collection. No mass lesion, mass effect, or midline shift. No CT evidence of acute infarction. Nonspecific mild subcortical and periventricular white matter hypodensity, most in keeping with chronic small vessel ischemic change. Cerebral volume is age appropriate. No ventriculomegaly. Vascular: No acute abnormality. Skull: No evidence of calvarial fracture. Sinuses/Orbits: The visualized paranasal sinuses are essentially clear. Other: Moderate right parietal scalp hematoma. The mastoid air cells are unopacified. CT CERVICAL SPINE FINDINGS Alignment: Straightening of the cervical spine. No facet subluxation. Dens is well positioned between the lateral masses of C1. Stable minimal 2 mm anterolisthesis at C2-3. Stable minimal 2 mm retrolisthesis at C4-5 and C5-6. stable mild 3 mm anterolisthesis at C7-T1. Skull base and vertebrae: No acute fracture. No primary bone lesion or focal pathologic process. Soft tissues and spinal canal: No prevertebral edema. No visible canal hematoma. Disc levels: Severe multilevel cervical degenerative disc disease, unchanged. Moderate bilateral facet arthropathy,  with ankylosis on the left at the C7-T1 facet. Mild degenerative foraminal stenosis bilaterally at C4-5 and C5-6. Moderate degenerative foraminal stenosis on the left at C6-7. Upper chest: No acute abnormality. Other: Visualized mastoid air cells appear clear. Subcentimeter hypodense right thyroid lobe nodules. No pathologically enlarged cervical nodes. IMPRESSION: CT HEAD: 1. Moderate right parietal scalp hematoma. 2. No evidence of acute intracranial abnormality. No evidence of calvarial fracture. 3. Mild chronic small vessel ischemic changes in the cerebral white matter. CT CERVICAL SPINE: 1. No cervical spine fracture  or acute subluxation. 2. Advanced multilevel degenerative changes throughout the cervical spine as detailed. 3. Stable mild multilevel degenerative cervical spondylolisthesis. Electronically Signed   By: Ilona Sorrel M.D.   On: 09/02/2017 21:31   Ct Cervical Spine Wo Contrast  Result Date: 09/02/2017 CLINICAL DATA:  Fall.  Posterior head injury. EXAM: CT HEAD WITHOUT CONTRAST CT CERVICAL SPINE WITHOUT CONTRAST TECHNIQUE: Multidetector CT imaging of the head and cervical spine was performed following the standard protocol without intravenous contrast. Multiplanar CT image reconstructions of the cervical spine were also generated. COMPARISON:  09/08/2014 head and cervical spine CT. FINDINGS: CT HEAD FINDINGS Brain: No evidence of parenchymal hemorrhage or extra-axial fluid collection. No mass lesion, mass effect, or midline shift. No CT evidence of acute infarction. Nonspecific mild subcortical and periventricular white matter hypodensity, most in keeping with chronic small vessel ischemic change. Cerebral volume is age appropriate. No ventriculomegaly. Vascular: No acute abnormality. Skull: No evidence of calvarial fracture. Sinuses/Orbits: The visualized paranasal sinuses are essentially clear. Other: Moderate right parietal scalp hematoma. The mastoid air cells are unopacified. CT CERVICAL  SPINE FINDINGS Alignment: Straightening of the cervical spine. No facet subluxation. Dens is well positioned between the lateral masses of C1. Stable minimal 2 mm anterolisthesis at C2-3. Stable minimal 2 mm retrolisthesis at C4-5 and C5-6. stable mild 3 mm anterolisthesis at C7-T1. Skull base and vertebrae: No acute fracture. No primary bone lesion or focal pathologic process. Soft tissues and spinal canal: No prevertebral edema. No visible canal hematoma. Disc levels: Severe multilevel cervical degenerative disc disease, unchanged. Moderate bilateral facet arthropathy, with ankylosis on the left at the C7-T1 facet. Mild degenerative foraminal stenosis bilaterally at C4-5 and C5-6. Moderate degenerative foraminal stenosis on the left at C6-7. Upper chest: No acute abnormality. Other: Visualized mastoid air cells appear clear. Subcentimeter hypodense right thyroid lobe nodules. No pathologically enlarged cervical nodes. IMPRESSION: CT HEAD: 1. Moderate right parietal scalp hematoma. 2. No evidence of acute intracranial abnormality. No evidence of calvarial fracture. 3. Mild chronic small vessel ischemic changes in the cerebral white matter. CT CERVICAL SPINE: 1. No cervical spine fracture or acute subluxation. 2. Advanced multilevel degenerative changes throughout the cervical spine as detailed. 3. Stable mild multilevel degenerative cervical spondylolisthesis. Electronically Signed   By: Ilona Sorrel M.D.   On: 09/02/2017 21:31    Procedures Procedures (including critical care time)  Medications Ordered in ED Medications - No data to display   Initial Impression / Assessment and Plan / ED Course  I have reviewed the triage vital signs and the nursing notes.  Pertinent labs & imaging results that were available during my care of the patient were reviewed by me and considered in my medical decision making (see chart for details).     Patient in emergency department after a fall, after she "lost  balance."  She has large hematoma to the back of the scalp, otherwise no other injuries.  She is not on any blood thinners.  Will get CT head and cervical spine.  Will get urine and EKG since not 100% sure why patient fell.  Will monitor.   10:57 PM EKG with no significant abnormality.  Urinalysis clear.  CT is negative.  Discussed with Dr. Darl Householder who has seen patient as well.  Will discharge home with family.  Ice pack provided, instructed to ice at home and Tylenol for any pain.  Follow-up with family doctor closely.  Return precautions discussed.  Vitals:   09/02/17 2024  BP: (!) 159/66  Pulse: 78  Resp: 14  Temp: 98.5 F (36.9 C)  TempSrc: Oral  SpO2: 96%  Weight: 59 kg (130 lb)  Height: 5\' 7"  (1.702 m)     Final Clinical Impressions(s) / ED Diagnoses   Final diagnoses:  Injury of head, initial encounter  Laceration of scalp, initial encounter    ED Discharge Orders    None       Jeannett Senior, PA-C 09/02/17 [id number]    Drenda Freeze, MD 09/03/17 [phone]

## 2017-09-02 NOTE — ED Notes (Signed)
Bed: XB26 Expected date:  Expected time:  Means of arrival:  Comments: 9f fall

## 2017-09-02 NOTE — ED Notes (Signed)
PT TRANSPORTED TO CT WILL COMPLETE EKG UPON RETURN

## 2017-09-08 NOTE — Progress Notes (Signed)
Office Visit Note  Patient: Dawn Diaz             Date of Birth: Apr 24, 1927           MRN: [id number]             PCP: Shon Baton, MD Referring: Shon Baton, MD Visit Date: 09/21/2017 Occupation: @GUAROCC @    Subjective:  Medication management.   History of Present Illness: Dawn Diaz is a 84 y.o. female with history of seronegative rheumatoid arthritis.  She states she has been doing very well.  She denies any joint pain or joint swelling.  She still have some discomfort from right total knee replacement.  She has a stiffness in her C-spine.  She states she fell backwards about 2 weeks ago and went to emergency room where her evaluation was normal.  She still have some headaches from that.  Activities of Daily Living:  Patient reports morning stiffness for 0 minutes.   Patient Reports nocturnal pain.  Difficulty dressing/grooming: Denies Difficulty climbing stairs: Denies Difficulty getting out of chair: Reports Difficulty using hands for taps, buttons, cutlery, and/or writing: Denies   Review of Systems  Constitutional: Positive for fatigue. Negative for night sweats, weight gain and weight loss.  HENT: Positive for mouth dryness. Negative for mouth sores, trouble swallowing, trouble swallowing and nose dryness.   Eyes: Positive for dryness. Negative for pain, redness and visual disturbance.  Respiratory: Negative for cough, shortness of breath and difficulty breathing.   Cardiovascular: Negative for chest pain, palpitations, hypertension, irregular heartbeat and swelling in legs/feet.  Gastrointestinal: Negative for abdominal pain, blood in stool, constipation and diarrhea.  Endocrine: Negative for increased urination.  Genitourinary: Negative for pelvic pain and vaginal dryness.  Musculoskeletal: Positive for arthralgias and joint pain. Negative for joint swelling, myalgias, muscle weakness, morning stiffness, muscle tenderness and myalgias.  Skin: Negative for  color change, rash, hair loss, skin tightness, ulcers and sensitivity to sunlight.  Allergic/Immunologic: Negative for susceptible to infections.  Neurological: Positive for memory loss. Negative for dizziness, headaches, night sweats and weakness.  Hematological: Negative for bruising/bleeding tendency and swollen glands.  Psychiatric/Behavioral: Negative for depressed mood, confusion and sleep disturbance. The patient is not nervous/anxious.     PMFS History:  Patient Active Problem List   Diagnosis Date Noted  . Depression, major, single episode, moderate ([facility]) 03/28/2017  . Pulmonary nodule seen on imaging study 03/28/2017  . Acquired leg length discrepancy 03/28/2017  . Closed displaced fracture of right femoral neck ([place]) 03/22/2017  . Breast cancer, stage 1 ([facility])   . Rheumatoid arthritis, seronegative, multiple sites ([facility]) 11/15/2016  . Osteopenia 11/15/2016  . Primary osteoarthritis of both hands 11/15/2016  . High risk medications (not anticoagulants) long-term use 11/15/2016  . Family history of malignant neoplasm of gastrointestinal tract 12/25/2013  . Constipation 04/24/2013  . Glaucoma 04/24/2013  . OA (osteoarthritis) of knee 04/15/2013  . History of breast cancer in female 12/20/2010  . DIVERTICULOSIS-COLON 08/06/2009  . IRRITABLE BOWEL SYNDROME 08/06/2009    Past Medical History:  Diagnosis Date  . Anxiety disorder   . Bacterial overgrowth syndrome   . Breast cancer, stage 1 [facility])    '97/recurrent '05 right breast(s/p mastectomy)  . Bunion   . Depression   . Diverticulitis   . GERD (gastroesophageal reflux disease)   . Glaucoma   . Hiatal hernia   . History of TIAs    - 13 yrs ago"brief memory loss"  . Irritable bowel syndrome   .  Osteoporosis   . Ovarian cyst     Family History  Problem Relation Age of Onset  . Colon cancer Sister 46       Died at 64  . Kidney cancer Father 68  . Pancreatic cancer Mother 52  . Pancreatic cancer Maternal Grandmother  36  . Cancer Paternal Grandfather        died from "abdominal ca" at young age  . Breast cancer Neg Hx    Past Surgical History:  Procedure Laterality Date  . ABDOMINAL HYSTERECTOMY    . ANTERIOR APPROACH HEMI HIP ARTHROPLASTY Right 03/22/2017   Procedure: ANTERIOR APPROACH HEMI HIP ARTHROPLASTY;  Surgeon: Rod Can, MD;  Location: [facility];  Service: Orthopedics;  Laterality: Right;  . BREAST BIOPSY     right  . BUNIONECTOMY Bilateral    both, repeat x1  . CATARACT EXTRACTION Left   . HIP ARTHROPLASTY    . KNEE ARTHROPLASTY    . MASTECTOMY  1997   Right  . TONSILLECTOMY    . TOTAL KNEE ARTHROPLASTY Right 04/15/2013   Procedure: RIGHT TOTAL KNEE ARTHROPLASTY;  Surgeon: Gearlean Alf, MD;  Location: [facility];  Service: Orthopedics;  Laterality: Right;   Social History   Social History Narrative  . Not on file     Objective: Vital Signs: BP 131/67 (BP Location: Left Arm, Patient Position: Sitting, Cuff Size: Normal)   Pulse 61   Resp 14   Ht 5' 7.5" (1.715 m)   Wt 135 lb (61.2 kg)   BMI 20.83 kg/m    Physical Exam  Constitutional: She is oriented to person, place, and time. She appears well-developed and well-nourished.  HENT:  Head: Normocephalic and atraumatic.  Eyes: Conjunctivae and EOM are normal.  Neck: Normal range of motion.  Cardiovascular: Normal rate, regular rhythm, normal heart sounds and intact distal pulses.  Pulmonary/Chest: Effort normal and breath sounds normal.  Abdominal: Soft. Bowel sounds are normal.  Lymphadenopathy:    She has no cervical adenopathy.  Neurological: She is alert and oriented to person, place, and time.  Skin: Skin is warm and dry. Capillary refill takes less than 2 seconds.  Psychiatric: She has a normal mood and affect. Her behavior is normal.  Nursing note and vitals reviewed.    Musculoskeletal Exam: C-spine some limitation with range of motion.  Thoracic lumbar spine good range of motion.  Shoulder joints elbow joints  wrist joints are good range of motion.  She has bilateral CMC DIP PIP thickening.  No synovitis was noted.  Right total knee replacement is doing well.  She has no warmth or swelling in her knee joints or ankle joints.  CDAI Exam: CDAI Homunculus Exam:   Joint Counts:  CDAI Tender Joint count: 0 CDAI Swollen Joint count: 0  Global Assessments:  Patient Global Assessment: 1 Provider Global Assessment: 1  CDAI Calculated Score: 2    Investigation: No additional findings.PLQ eye exam: 02/07/2017 CBC Latest Ref Rng & Units 03/24/2017 03/23/2017 03/22/2017  WBC 4.0 - 10.5 K/uL 7.4 9.3 7.9  Hemoglobin 12.0 - 15.0 g/dL 11.0(L) 11.2(L) 13.0  Hematocrit 36.0 - 46.0 % 34.8(L) 34.1(L) 39.8  Platelets 150 - 400 K/uL 172 171 194   CMP Latest Ref Rng & Units 03/24/2017 03/23/2017 03/22/2017  Glucose 65 - 99 mg/dL 102(H) 130(H) 134(H)  BUN 6 - 20 mg/dL 13 15 20   Creatinine 0.44 - 1.00 mg/dL 0.68 0.71 0.61  Sodium 135 - 145 mmol/L 135 136 138  Potassium 3.5 -  5.1 mmol/L 3.3(L) 3.9 3.7  Chloride 101 - 111 mmol/L 103 101 104  CO2 22 - 32 mmol/L 28 27 26   Calcium 8.9 - 10.3 mg/dL 7.9(L) 8.2(L) 8.5(L)  Total Protein 6.1 - 8.1 g/dL - - -  Total Bilirubin 0.2 - 1.2 mg/dL - - -  Alkaline Phos 33 - 130 U/L - - -  AST 10 - 35 U/L - - -  ALT 6 - 29 U/L - - -    Imaging: Ct Head Wo Contrast  Result Date: 09/02/2017 CLINICAL DATA:  Fall.  Posterior head injury. EXAM: CT HEAD WITHOUT CONTRAST CT CERVICAL SPINE WITHOUT CONTRAST TECHNIQUE: Multidetector CT imaging of the head and cervical spine was performed following the standard protocol without intravenous contrast. Multiplanar CT image reconstructions of the cervical spine were also generated. COMPARISON:  09/08/2014 head and cervical spine CT. FINDINGS: CT HEAD FINDINGS Brain: No evidence of parenchymal hemorrhage or extra-axial fluid collection. No mass lesion, mass effect, or midline shift. No CT evidence of acute infarction. Nonspecific mild  subcortical and periventricular white matter hypodensity, most in keeping with chronic small vessel ischemic change. Cerebral volume is age appropriate. No ventriculomegaly. Vascular: No acute abnormality. Skull: No evidence of calvarial fracture. Sinuses/Orbits: The visualized paranasal sinuses are essentially clear. Other: Moderate right parietal scalp hematoma. The mastoid air cells are unopacified. CT CERVICAL SPINE FINDINGS Alignment: Straightening of the cervical spine. No facet subluxation. Dens is well positioned between the lateral masses of C1. Stable minimal 2 mm anterolisthesis at C2-3. Stable minimal 2 mm retrolisthesis at C4-5 and C5-6. stable mild 3 mm anterolisthesis at C7-T1. Skull base and vertebrae: No acute fracture. No primary bone lesion or focal pathologic process. Soft tissues and spinal canal: No prevertebral edema. No visible canal hematoma. Disc levels: Severe multilevel cervical degenerative disc disease, unchanged. Moderate bilateral facet arthropathy, with ankylosis on the left at the C7-T1 facet. Mild degenerative foraminal stenosis bilaterally at C4-5 and C5-6. Moderate degenerative foraminal stenosis on the left at C6-7. Upper chest: No acute abnormality. Other: Visualized mastoid air cells appear clear. Subcentimeter hypodense right thyroid lobe nodules. No pathologically enlarged cervical nodes. IMPRESSION: CT HEAD: 1. Moderate right parietal scalp hematoma. 2. No evidence of acute intracranial abnormality. No evidence of calvarial fracture. 3. Mild chronic small vessel ischemic changes in the cerebral white matter. CT CERVICAL SPINE: 1. No cervical spine fracture or acute subluxation. 2. Advanced multilevel degenerative changes throughout the cervical spine as detailed. 3. Stable mild multilevel degenerative cervical spondylolisthesis. Electronically Signed   By: Ilona Sorrel M.D.   On: 09/02/2017 21:31   Ct Cervical Spine Wo Contrast  Result Date: 09/02/2017 CLINICAL DATA:   Fall.  Posterior head injury. EXAM: CT HEAD WITHOUT CONTRAST CT CERVICAL SPINE WITHOUT CONTRAST TECHNIQUE: Multidetector CT imaging of the head and cervical spine was performed following the standard protocol without intravenous contrast. Multiplanar CT image reconstructions of the cervical spine were also generated. COMPARISON:  09/08/2014 head and cervical spine CT. FINDINGS: CT HEAD FINDINGS Brain: No evidence of parenchymal hemorrhage or extra-axial fluid collection. No mass lesion, mass effect, or midline shift. No CT evidence of acute infarction. Nonspecific mild subcortical and periventricular white matter hypodensity, most in keeping with chronic small vessel ischemic change. Cerebral volume is age appropriate. No ventriculomegaly. Vascular: No acute abnormality. Skull: No evidence of calvarial fracture. Sinuses/Orbits: The visualized paranasal sinuses are essentially clear. Other: Moderate right parietal scalp hematoma. The mastoid air cells are unopacified. CT CERVICAL SPINE FINDINGS Alignment: Straightening of  the cervical spine. No facet subluxation. Dens is well positioned between the lateral masses of C1. Stable minimal 2 mm anterolisthesis at C2-3. Stable minimal 2 mm retrolisthesis at C4-5 and C5-6. stable mild 3 mm anterolisthesis at C7-T1. Skull base and vertebrae: No acute fracture. No primary bone lesion or focal pathologic process. Soft tissues and spinal canal: No prevertebral edema. No visible canal hematoma. Disc levels: Severe multilevel cervical degenerative disc disease, unchanged. Moderate bilateral facet arthropathy, with ankylosis on the left at the C7-T1 facet. Mild degenerative foraminal stenosis bilaterally at C4-5 and C5-6. Moderate degenerative foraminal stenosis on the left at C6-7. Upper chest: No acute abnormality. Other: Visualized mastoid air cells appear clear. Subcentimeter hypodense right thyroid lobe nodules. No pathologically enlarged cervical nodes. IMPRESSION: CT HEAD:  1. Moderate right parietal scalp hematoma. 2. No evidence of acute intracranial abnormality. No evidence of calvarial fracture. 3. Mild chronic small vessel ischemic changes in the cerebral white matter. CT CERVICAL SPINE: 1. No cervical spine fracture or acute subluxation. 2. Advanced multilevel degenerative changes throughout the cervical spine as detailed. 3. Stable mild multilevel degenerative cervical spondylolisthesis. Electronically Signed   By: Ilona Sorrel M.D.   On: 09/02/2017 21:31    Speciality Comments: No specialty comments available.    Procedures:  No procedures performed Allergies: Amoxicillin and Latex   Assessment / Plan:     Visit Diagnoses: Rheumatoid arthritis of multiple sites with negative rheumatoid factor (HCC) - seronegative.  Clinically she is doing very well without any synovitis.  I have advised her to reduce her Plaquenil to 1 tablet p.o. daily only.  High risk medication use - PLQ eye exam: 02/07/2017 - Plan: CBC with Differential/Platelet, COMPLETE METABOLIC PANEL WITH GFR today and then in 5 months.  Primary osteoarthritis of both hands: Joint protection muscle strengthening discussed.  History of total right knee replacement: She still have some discomfort.  DDD (degenerative disc disease), cervical: She has limited range of motion.  Osteopenia of multiple sites: She has been taking calcium and vitamin D.  History of vitamin D deficiency  Other medical problems are listed as follows:  History of scoliosis  History of thyroiditis  History of diverticulosis  History of breast cancer  History of IBS    Orders: Orders Placed This Encounter  Procedures  . CBC with Differential/Platelet  . COMPLETE METABOLIC PANEL WITH GFR   No orders of the defined types were placed in this encounter.     Follow-Up Instructions: Return in about 5 months (around 02/21/2018) for Rheumatoid arthritis.   Bo Merino, MD  Note - This record has been  created using Editor, commissioning.  Chart creation errors have been sought, but may not always  have been located. Such creation errors do not reflect on  the standard of medical care.

## 2017-09-14 DIAGNOSIS — I10 Essential (primary) hypertension: Secondary | ICD-10-CM | POA: Diagnosis not present

## 2017-09-14 DIAGNOSIS — R4189 Other symptoms and signs involving cognitive functions and awareness: Secondary | ICD-10-CM | POA: Diagnosis not present

## 2017-09-14 DIAGNOSIS — R2681 Unsteadiness on feet: Secondary | ICD-10-CM | POA: Diagnosis not present

## 2017-09-19 DIAGNOSIS — L84 Corns and callosities: Secondary | ICD-10-CM | POA: Diagnosis not present

## 2017-09-19 DIAGNOSIS — B351 Tinea unguium: Secondary | ICD-10-CM | POA: Diagnosis not present

## 2017-09-19 DIAGNOSIS — M2041 Other hammer toe(s) (acquired), right foot: Secondary | ICD-10-CM | POA: Diagnosis not present

## 2017-09-21 ENCOUNTER — Encounter: Payer: Self-pay | Admitting: Rheumatology

## 2017-09-21 ENCOUNTER — Ambulatory Visit (INDEPENDENT_AMBULATORY_CARE_PROVIDER_SITE_OTHER): Payer: Medicare Other | Admitting: Rheumatology

## 2017-09-21 VITALS — BP 131/67 | HR 61 | Resp 14 | Ht 67.5 in | Wt 135.0 lb

## 2017-09-21 DIAGNOSIS — Z853 Personal history of malignant neoplasm of breast: Secondary | ICD-10-CM

## 2017-09-21 DIAGNOSIS — Z79899 Other long term (current) drug therapy: Secondary | ICD-10-CM

## 2017-09-21 DIAGNOSIS — M19042 Primary osteoarthritis, left hand: Secondary | ICD-10-CM

## 2017-09-21 DIAGNOSIS — M0609 Rheumatoid arthritis without rheumatoid factor, multiple sites: Secondary | ICD-10-CM | POA: Diagnosis not present

## 2017-09-21 DIAGNOSIS — Z8739 Personal history of other diseases of the musculoskeletal system and connective tissue: Secondary | ICD-10-CM | POA: Diagnosis not present

## 2017-09-21 DIAGNOSIS — M19041 Primary osteoarthritis, right hand: Secondary | ICD-10-CM | POA: Diagnosis not present

## 2017-09-21 DIAGNOSIS — Z8719 Personal history of other diseases of the digestive system: Secondary | ICD-10-CM

## 2017-09-21 DIAGNOSIS — M8589 Other specified disorders of bone density and structure, multiple sites: Secondary | ICD-10-CM

## 2017-09-21 DIAGNOSIS — Z96651 Presence of right artificial knee joint: Secondary | ICD-10-CM | POA: Diagnosis not present

## 2017-09-21 DIAGNOSIS — Z8639 Personal history of other endocrine, nutritional and metabolic disease: Secondary | ICD-10-CM | POA: Diagnosis not present

## 2017-09-21 DIAGNOSIS — M5030 Other cervical disc degeneration, unspecified cervical region: Secondary | ICD-10-CM

## 2017-09-21 LAB — CBC WITH DIFFERENTIAL/PLATELET
BASOS ABS: 67 {cells}/uL (ref 0–200)
Basophils Relative: 1.2 %
EOS PCT: 3.9 %
Eosinophils Absolute: 218 cells/uL (ref 15–500)
HEMATOCRIT: 41.4 % (ref 35.0–45.0)
Hemoglobin: 13.2 g/dL (ref 11.7–15.5)
Lymphs Abs: 1406 cells/uL (ref 850–3900)
MCH: 26.3 pg — AB (ref 27.0–33.0)
MCHC: 31.9 g/dL — AB (ref 32.0–36.0)
MCV: 82.5 fL (ref 80.0–100.0)
MONOS PCT: 10.7 %
MPV: 10.1 fL (ref 7.5–12.5)
NEUTROS PCT: 59.1 %
Neutro Abs: 3310 cells/uL (ref 1500–7800)
Platelets: 267 10*3/uL (ref 140–400)
RBC: 5.02 10*6/uL (ref 3.80–5.10)
RDW: 14.8 % (ref 11.0–15.0)
Total Lymphocyte: 25.1 %
WBC mixed population: 599 cells/uL (ref 200–950)
WBC: 5.6 10*3/uL (ref 3.8–10.8)

## 2017-09-21 LAB — COMPLETE METABOLIC PANEL WITH GFR
AG Ratio: 1.4 (calc) (ref 1.0–2.5)
ALKALINE PHOSPHATASE (APISO): 95 U/L (ref 33–130)
ALT: 16 U/L (ref 6–29)
AST: 23 U/L (ref 10–35)
Albumin: 3.9 g/dL (ref 3.6–5.1)
BILIRUBIN TOTAL: 0.4 mg/dL (ref 0.2–1.2)
BUN: 22 mg/dL (ref 7–25)
CHLORIDE: 103 mmol/L (ref 98–110)
CO2: 32 mmol/L (ref 20–32)
CREATININE: 0.74 mg/dL (ref 0.60–0.88)
Calcium: 9.2 mg/dL (ref 8.6–10.4)
GFR, Est African American: 84 mL/min/{1.73_m2} (ref >=60)
GFR, Est Non African American: 72 mL/min/{1.73_m2} (ref >=60)
Globulin: 2.7 g/dL (calc) (ref 1.9–3.7)
Glucose, Bld: 82 mg/dL (ref 65–99)
POTASSIUM: 4.9 mmol/L (ref 3.5–5.3)
Sodium: 140 mmol/L (ref 135–146)
Total Protein: 6.6 g/dL (ref 6.1–8.1)

## 2017-09-21 NOTE — Patient Instructions (Signed)
Reduce Plaquenil to 1 tablet by mouth daily.  Your eye exam is due in August.

## 2017-09-26 DIAGNOSIS — R2681 Unsteadiness on feet: Secondary | ICD-10-CM | POA: Diagnosis not present

## 2017-09-26 DIAGNOSIS — G3184 Mild cognitive impairment, so stated: Secondary | ICD-10-CM | POA: Diagnosis not present

## 2017-09-26 DIAGNOSIS — R488 Other symbolic dysfunctions: Secondary | ICD-10-CM | POA: Diagnosis not present

## 2017-09-26 DIAGNOSIS — R4701 Aphasia: Secondary | ICD-10-CM | POA: Diagnosis not present

## 2017-09-26 DIAGNOSIS — R2689 Other abnormalities of gait and mobility: Secondary | ICD-10-CM | POA: Diagnosis not present

## 2017-09-26 DIAGNOSIS — R278 Other lack of coordination: Secondary | ICD-10-CM | POA: Diagnosis not present

## 2017-09-26 DIAGNOSIS — Z9181 History of falling: Secondary | ICD-10-CM | POA: Diagnosis not present

## 2017-09-26 DIAGNOSIS — M6259 Muscle wasting and atrophy, not elsewhere classified, multiple sites: Secondary | ICD-10-CM | POA: Diagnosis not present

## 2017-09-27 DIAGNOSIS — R488 Other symbolic dysfunctions: Secondary | ICD-10-CM | POA: Diagnosis not present

## 2017-09-27 DIAGNOSIS — R2689 Other abnormalities of gait and mobility: Secondary | ICD-10-CM | POA: Diagnosis not present

## 2017-09-27 DIAGNOSIS — R278 Other lack of coordination: Secondary | ICD-10-CM | POA: Diagnosis not present

## 2017-09-27 DIAGNOSIS — R2681 Unsteadiness on feet: Secondary | ICD-10-CM | POA: Diagnosis not present

## 2017-09-27 DIAGNOSIS — M6259 Muscle wasting and atrophy, not elsewhere classified, multiple sites: Secondary | ICD-10-CM | POA: Diagnosis not present

## 2017-09-27 DIAGNOSIS — G3184 Mild cognitive impairment, so stated: Secondary | ICD-10-CM | POA: Diagnosis not present

## 2017-10-02 DIAGNOSIS — R2689 Other abnormalities of gait and mobility: Secondary | ICD-10-CM | POA: Diagnosis not present

## 2017-10-02 DIAGNOSIS — R278 Other lack of coordination: Secondary | ICD-10-CM | POA: Diagnosis not present

## 2017-10-02 DIAGNOSIS — G3184 Mild cognitive impairment, so stated: Secondary | ICD-10-CM | POA: Diagnosis not present

## 2017-10-02 DIAGNOSIS — R2681 Unsteadiness on feet: Secondary | ICD-10-CM | POA: Diagnosis not present

## 2017-10-02 DIAGNOSIS — M6259 Muscle wasting and atrophy, not elsewhere classified, multiple sites: Secondary | ICD-10-CM | POA: Diagnosis not present

## 2017-10-02 DIAGNOSIS — R488 Other symbolic dysfunctions: Secondary | ICD-10-CM | POA: Diagnosis not present

## 2017-10-05 DIAGNOSIS — M6259 Muscle wasting and atrophy, not elsewhere classified, multiple sites: Secondary | ICD-10-CM | POA: Diagnosis not present

## 2017-10-05 DIAGNOSIS — R2689 Other abnormalities of gait and mobility: Secondary | ICD-10-CM | POA: Diagnosis not present

## 2017-10-05 DIAGNOSIS — G3184 Mild cognitive impairment, so stated: Secondary | ICD-10-CM | POA: Diagnosis not present

## 2017-10-05 DIAGNOSIS — R488 Other symbolic dysfunctions: Secondary | ICD-10-CM | POA: Diagnosis not present

## 2017-10-05 DIAGNOSIS — R2681 Unsteadiness on feet: Secondary | ICD-10-CM | POA: Diagnosis not present

## 2017-10-05 DIAGNOSIS — R278 Other lack of coordination: Secondary | ICD-10-CM | POA: Diagnosis not present

## 2017-10-09 DIAGNOSIS — M545 Low back pain: Secondary | ICD-10-CM | POA: Diagnosis not present

## 2017-10-10 DIAGNOSIS — R102 Pelvic and perineal pain: Secondary | ICD-10-CM | POA: Diagnosis not present

## 2017-10-12 DIAGNOSIS — R2681 Unsteadiness on feet: Secondary | ICD-10-CM | POA: Diagnosis not present

## 2017-10-12 DIAGNOSIS — R2689 Other abnormalities of gait and mobility: Secondary | ICD-10-CM | POA: Diagnosis not present

## 2017-10-12 DIAGNOSIS — R278 Other lack of coordination: Secondary | ICD-10-CM | POA: Diagnosis not present

## 2017-10-12 DIAGNOSIS — R488 Other symbolic dysfunctions: Secondary | ICD-10-CM | POA: Diagnosis not present

## 2017-10-12 DIAGNOSIS — M6259 Muscle wasting and atrophy, not elsewhere classified, multiple sites: Secondary | ICD-10-CM | POA: Diagnosis not present

## 2017-10-12 DIAGNOSIS — G3184 Mild cognitive impairment, so stated: Secondary | ICD-10-CM | POA: Diagnosis not present

## 2017-10-13 DIAGNOSIS — S76301A Unspecified injury of muscle, fascia and tendon of the posterior muscle group at thigh level, right thigh, initial encounter: Secondary | ICD-10-CM | POA: Diagnosis not present

## 2017-10-13 DIAGNOSIS — M545 Low back pain: Secondary | ICD-10-CM | POA: Diagnosis not present

## 2017-10-16 DIAGNOSIS — R278 Other lack of coordination: Secondary | ICD-10-CM | POA: Diagnosis not present

## 2017-10-16 DIAGNOSIS — R2689 Other abnormalities of gait and mobility: Secondary | ICD-10-CM | POA: Diagnosis not present

## 2017-10-16 DIAGNOSIS — R488 Other symbolic dysfunctions: Secondary | ICD-10-CM | POA: Diagnosis not present

## 2017-10-16 DIAGNOSIS — M6259 Muscle wasting and atrophy, not elsewhere classified, multiple sites: Secondary | ICD-10-CM | POA: Diagnosis not present

## 2017-10-16 DIAGNOSIS — G3184 Mild cognitive impairment, so stated: Secondary | ICD-10-CM | POA: Diagnosis not present

## 2017-10-16 DIAGNOSIS — R2681 Unsteadiness on feet: Secondary | ICD-10-CM | POA: Diagnosis not present

## 2017-10-18 DIAGNOSIS — C4401 Basal cell carcinoma of skin of lip: Secondary | ICD-10-CM | POA: Diagnosis not present

## 2017-10-20 DIAGNOSIS — R2689 Other abnormalities of gait and mobility: Secondary | ICD-10-CM | POA: Diagnosis not present

## 2017-10-20 DIAGNOSIS — R2681 Unsteadiness on feet: Secondary | ICD-10-CM | POA: Diagnosis not present

## 2017-10-20 DIAGNOSIS — R4701 Aphasia: Secondary | ICD-10-CM | POA: Diagnosis not present

## 2017-10-20 DIAGNOSIS — R278 Other lack of coordination: Secondary | ICD-10-CM | POA: Diagnosis not present

## 2017-10-20 DIAGNOSIS — G3184 Mild cognitive impairment, so stated: Secondary | ICD-10-CM | POA: Diagnosis not present

## 2017-10-20 DIAGNOSIS — Z9181 History of falling: Secondary | ICD-10-CM | POA: Diagnosis not present

## 2017-10-20 DIAGNOSIS — M6259 Muscle wasting and atrophy, not elsewhere classified, multiple sites: Secondary | ICD-10-CM | POA: Diagnosis not present

## 2017-10-20 DIAGNOSIS — R41841 Cognitive communication deficit: Secondary | ICD-10-CM | POA: Diagnosis not present

## 2017-10-23 DIAGNOSIS — R278 Other lack of coordination: Secondary | ICD-10-CM | POA: Diagnosis not present

## 2017-10-23 DIAGNOSIS — G3184 Mild cognitive impairment, so stated: Secondary | ICD-10-CM | POA: Diagnosis not present

## 2017-10-23 DIAGNOSIS — R2689 Other abnormalities of gait and mobility: Secondary | ICD-10-CM | POA: Diagnosis not present

## 2017-10-23 DIAGNOSIS — R2681 Unsteadiness on feet: Secondary | ICD-10-CM | POA: Diagnosis not present

## 2017-10-23 DIAGNOSIS — R41841 Cognitive communication deficit: Secondary | ICD-10-CM | POA: Diagnosis not present

## 2017-10-23 DIAGNOSIS — M6259 Muscle wasting and atrophy, not elsewhere classified, multiple sites: Secondary | ICD-10-CM | POA: Diagnosis not present

## 2017-10-24 DIAGNOSIS — R278 Other lack of coordination: Secondary | ICD-10-CM | POA: Diagnosis not present

## 2017-10-24 DIAGNOSIS — R2689 Other abnormalities of gait and mobility: Secondary | ICD-10-CM | POA: Diagnosis not present

## 2017-10-24 DIAGNOSIS — M6259 Muscle wasting and atrophy, not elsewhere classified, multiple sites: Secondary | ICD-10-CM | POA: Diagnosis not present

## 2017-10-24 DIAGNOSIS — R2681 Unsteadiness on feet: Secondary | ICD-10-CM | POA: Diagnosis not present

## 2017-10-24 DIAGNOSIS — G3184 Mild cognitive impairment, so stated: Secondary | ICD-10-CM | POA: Diagnosis not present

## 2017-10-24 DIAGNOSIS — R41841 Cognitive communication deficit: Secondary | ICD-10-CM | POA: Diagnosis not present

## 2017-10-26 DIAGNOSIS — R278 Other lack of coordination: Secondary | ICD-10-CM | POA: Diagnosis not present

## 2017-10-26 DIAGNOSIS — G3184 Mild cognitive impairment, so stated: Secondary | ICD-10-CM | POA: Diagnosis not present

## 2017-10-26 DIAGNOSIS — M6259 Muscle wasting and atrophy, not elsewhere classified, multiple sites: Secondary | ICD-10-CM | POA: Diagnosis not present

## 2017-10-26 DIAGNOSIS — R2681 Unsteadiness on feet: Secondary | ICD-10-CM | POA: Diagnosis not present

## 2017-10-26 DIAGNOSIS — R2689 Other abnormalities of gait and mobility: Secondary | ICD-10-CM | POA: Diagnosis not present

## 2017-10-26 DIAGNOSIS — R41841 Cognitive communication deficit: Secondary | ICD-10-CM | POA: Diagnosis not present

## 2017-11-02 DIAGNOSIS — R41841 Cognitive communication deficit: Secondary | ICD-10-CM | POA: Diagnosis not present

## 2017-11-02 DIAGNOSIS — R278 Other lack of coordination: Secondary | ICD-10-CM | POA: Diagnosis not present

## 2017-11-02 DIAGNOSIS — R2681 Unsteadiness on feet: Secondary | ICD-10-CM | POA: Diagnosis not present

## 2017-11-02 DIAGNOSIS — R2689 Other abnormalities of gait and mobility: Secondary | ICD-10-CM | POA: Diagnosis not present

## 2017-11-02 DIAGNOSIS — M6259 Muscle wasting and atrophy, not elsewhere classified, multiple sites: Secondary | ICD-10-CM | POA: Diagnosis not present

## 2017-11-02 DIAGNOSIS — G3184 Mild cognitive impairment, so stated: Secondary | ICD-10-CM | POA: Diagnosis not present

## 2017-11-17 DIAGNOSIS — L820 Inflamed seborrheic keratosis: Secondary | ICD-10-CM | POA: Diagnosis not present

## 2017-11-17 DIAGNOSIS — L821 Other seborrheic keratosis: Secondary | ICD-10-CM | POA: Diagnosis not present

## 2017-11-17 DIAGNOSIS — L570 Actinic keratosis: Secondary | ICD-10-CM | POA: Diagnosis not present

## 2017-11-20 DIAGNOSIS — Z961 Presence of intraocular lens: Secondary | ICD-10-CM | POA: Diagnosis not present

## 2017-11-20 DIAGNOSIS — H401122 Primary open-angle glaucoma, left eye, moderate stage: Secondary | ICD-10-CM | POA: Diagnosis not present

## 2017-11-20 DIAGNOSIS — H401113 Primary open-angle glaucoma, right eye, severe stage: Secondary | ICD-10-CM | POA: Diagnosis not present

## 2017-11-28 DIAGNOSIS — M545 Low back pain: Secondary | ICD-10-CM | POA: Diagnosis not present

## 2017-11-28 DIAGNOSIS — Z6821 Body mass index (BMI) 21.0-21.9, adult: Secondary | ICD-10-CM | POA: Diagnosis not present

## 2017-12-12 ENCOUNTER — Other Ambulatory Visit: Payer: Self-pay | Admitting: Internal Medicine

## 2017-12-12 DIAGNOSIS — Z1231 Encounter for screening mammogram for malignant neoplasm of breast: Secondary | ICD-10-CM

## 2017-12-14 DIAGNOSIS — H401122 Primary open-angle glaucoma, left eye, moderate stage: Secondary | ICD-10-CM | POA: Diagnosis not present

## 2017-12-14 DIAGNOSIS — H401113 Primary open-angle glaucoma, right eye, severe stage: Secondary | ICD-10-CM | POA: Diagnosis not present

## 2017-12-27 DIAGNOSIS — C50919 Malignant neoplasm of unspecified site of unspecified female breast: Secondary | ICD-10-CM | POA: Diagnosis not present

## 2017-12-27 DIAGNOSIS — Z6830 Body mass index (BMI) 30.0-30.9, adult: Secondary | ICD-10-CM | POA: Diagnosis not present

## 2017-12-27 DIAGNOSIS — N6019 Diffuse cystic mastopathy of unspecified breast: Secondary | ICD-10-CM | POA: Diagnosis not present

## 2017-12-27 DIAGNOSIS — I10 Essential (primary) hypertension: Secondary | ICD-10-CM | POA: Diagnosis not present

## 2018-01-01 ENCOUNTER — Other Ambulatory Visit: Payer: Self-pay | Admitting: Family Medicine

## 2018-01-01 DIAGNOSIS — R52 Pain, unspecified: Secondary | ICD-10-CM

## 2018-01-01 DIAGNOSIS — N6012 Diffuse cystic mastopathy of left breast: Secondary | ICD-10-CM

## 2018-01-02 DIAGNOSIS — E041 Nontoxic single thyroid nodule: Secondary | ICD-10-CM | POA: Diagnosis not present

## 2018-01-02 DIAGNOSIS — M810 Age-related osteoporosis without current pathological fracture: Secondary | ICD-10-CM | POA: Diagnosis not present

## 2018-01-02 DIAGNOSIS — R82998 Other abnormal findings in urine: Secondary | ICD-10-CM | POA: Diagnosis not present

## 2018-01-02 DIAGNOSIS — I10 Essential (primary) hypertension: Secondary | ICD-10-CM | POA: Diagnosis not present

## 2018-01-02 LAB — IFOBT (OCCULT BLOOD): IFOBT: NEGATIVE

## 2018-01-05 DIAGNOSIS — Z1212 Encounter for screening for malignant neoplasm of rectum: Secondary | ICD-10-CM | POA: Diagnosis not present

## 2018-01-09 DIAGNOSIS — J3089 Other allergic rhinitis: Secondary | ICD-10-CM | POA: Diagnosis not present

## 2018-01-09 DIAGNOSIS — H579 Unspecified disorder of eye and adnexa: Secondary | ICD-10-CM | POA: Diagnosis not present

## 2018-01-09 DIAGNOSIS — M0600 Rheumatoid arthritis without rheumatoid factor, unspecified site: Secondary | ICD-10-CM | POA: Diagnosis not present

## 2018-01-09 DIAGNOSIS — R2681 Unsteadiness on feet: Secondary | ICD-10-CM | POA: Diagnosis not present

## 2018-01-09 DIAGNOSIS — Z1389 Encounter for screening for other disorder: Secondary | ICD-10-CM | POA: Diagnosis not present

## 2018-01-09 DIAGNOSIS — N6019 Diffuse cystic mastopathy of unspecified breast: Secondary | ICD-10-CM | POA: Diagnosis not present

## 2018-01-09 DIAGNOSIS — Z6820 Body mass index (BMI) 20.0-20.9, adult: Secondary | ICD-10-CM | POA: Diagnosis not present

## 2018-01-09 DIAGNOSIS — Z23 Encounter for immunization: Secondary | ICD-10-CM | POA: Diagnosis not present

## 2018-01-09 DIAGNOSIS — R911 Solitary pulmonary nodule: Secondary | ICD-10-CM | POA: Diagnosis not present

## 2018-01-09 DIAGNOSIS — Z0000 Encounter for general adult medical examination without abnormal findings: Secondary | ICD-10-CM | POA: Diagnosis not present

## 2018-01-09 DIAGNOSIS — R4189 Other symptoms and signs involving cognitive functions and awareness: Secondary | ICD-10-CM | POA: Diagnosis not present

## 2018-01-09 DIAGNOSIS — I700 Atherosclerosis of aorta: Secondary | ICD-10-CM | POA: Diagnosis not present

## 2018-01-09 DIAGNOSIS — H4089 Other specified glaucoma: Secondary | ICD-10-CM | POA: Diagnosis not present

## 2018-01-15 ENCOUNTER — Ambulatory Visit
Admission: RE | Admit: 2018-01-15 | Discharge: 2018-01-15 | Disposition: A | Discharge status: Home / Self Care | Payer: Medicare Other | Source: Ambulatory Visit | Attending: Internal Medicine | Admitting: Internal Medicine

## 2018-01-15 DIAGNOSIS — Z1231 Encounter for screening mammogram for malignant neoplasm of breast: Secondary | ICD-10-CM

## 2018-01-15 LAB — HM MAMMOGRAPHY: HM Mammogram: NORMAL (ref 0–4)

## 2018-02-08 DIAGNOSIS — H43813 Vitreous degeneration, bilateral: Secondary | ICD-10-CM | POA: Diagnosis not present

## 2018-02-08 DIAGNOSIS — Z79899 Other long term (current) drug therapy: Secondary | ICD-10-CM | POA: Diagnosis not present

## 2018-02-08 DIAGNOSIS — H353132 Nonexudative age-related macular degeneration, bilateral, intermediate dry stage: Secondary | ICD-10-CM | POA: Diagnosis not present

## 2018-02-08 NOTE — Progress Notes (Signed)
Office Visit Note  Patient: Dawn Diaz             Date of Birth: 06/05/1927           MRN: [id number]             PCP: Shon Baton, MD Referring: Shon Baton, MD Visit Date: 02/22/2018 Occupation: @GUAROCC @  Subjective:  Back pain   History of Present Illness: Dawn Diaz is a 84 y.o. female with history of seronegative rheumatoid arthritis, DDD, and osteoarthritis.  She is on PLQ  200 mg 1 tablet daily M-F.  She denies any recent rheumatoid arthritis flares.  She denies in pain or swelling in hands or feet at this time.  She reports her right knee replacement is doing well.  She denies any knee pain or swelling at this time.  She states she had a right hip replacement following a right hip fracture in October 2018, and she continues to have discomfort.  She states Dr. Lyla Glassing performed the surgery but she has not followed up with him recently.  She denies any radiation of pain or numbness.  She continues to have neck pain and stiffness at times, but she works on ROM o na regular basis.  She has been going to an exercise class 3 times a week, which has been helping with flexibility and strength.       Activities of Daily Living:  Patient reports morning stiffness for 0 minutes.   Patient Denies nocturnal pain.  Difficulty dressing/grooming: Denies Difficulty climbing stairs: Reports Difficulty getting out of chair: Reports Difficulty using hands for taps, buttons, cutlery, and/or writing: Denies  Review of Systems  Constitutional: Negative for fatigue and fever.  HENT: Positive for mouth dryness. Negative for mouth sores and nose dryness.   Eyes: Negative for pain, visual disturbance and dryness.  Respiratory: Negative for cough, hemoptysis, shortness of breath and difficulty breathing.   Cardiovascular: Positive for palpitations and swelling in legs/feet. Negative for chest pain and hypertension.  Gastrointestinal: Negative for blood in stool, constipation and diarrhea.    Endocrine: Negative for increased urination.  Genitourinary: Negative for difficulty urinating and painful urination.  Musculoskeletal: Negative for arthralgias, joint pain, joint swelling, myalgias, muscle weakness, morning stiffness, muscle tenderness and myalgias.  Skin: Negative for color change, pallor, rash, hair loss, nodules/bumps, skin tightness, ulcers and sensitivity to sunlight.  Allergic/Immunologic: Negative for susceptible to infections.  Neurological: Negative for dizziness, numbness and headaches.  Hematological: Negative for swollen glands.  Psychiatric/Behavioral: Positive for depressed mood. Negative for sleep disturbance. The patient is not nervous/anxious.     PMFS History:  Patient Active Problem List   Diagnosis Date Noted  . Depression, major, single episode, moderate ([place]) 03/28/2017  . Pulmonary nodule seen on imaging study 03/28/2017  . Acquired leg length discrepancy 03/28/2017  . Closed displaced fracture of right femoral neck ([place]) 03/22/2017  . Breast cancer, stage 1 ([place])   . Rheumatoid arthritis, seronegative, multiple sites ([place]) 11/15/2016  . Osteopenia 11/15/2016  . Primary osteoarthritis of both hands 11/15/2016  . High risk medications (not anticoagulants) long-term use 11/15/2016  . Family history of malignant neoplasm of gastrointestinal tract 12/25/2013  . Constipation 04/24/2013  . Glaucoma 04/24/2013  . OA (osteoarthritis) of knee 04/15/2013  . History of breast cancer in female 12/20/2010  . DIVERTICULOSIS-COLON 08/06/2009  . IRRITABLE BOWEL SYNDROME 08/06/2009    Past Medical History:  Diagnosis Date  . Anxiety disorder   . Bacterial overgrowth syndrome   .  Breast cancer, stage 1 [facility])    '97/recurrent '05 right breast(s/p mastectomy)  . Bunion   . Depression   . Diverticulitis   . GERD (gastroesophageal reflux disease)   . Glaucoma   . Hiatal hernia   . History of TIAs    - 13 yrs ago"brief memory loss"  . Irritable bowel  syndrome   . Osteoporosis   . Ovarian cyst     Family History  Problem Relation Age of Onset  . Colon cancer Sister 88       Died at 42  . Kidney cancer Father 37  . Pancreatic cancer Mother 30  . Pancreatic cancer Maternal Grandmother 45  . Cancer Paternal Grandfather        died from "abdominal ca" at young age  . Breast cancer Neg Hx    Past Surgical History:  Procedure Laterality Date  . ABDOMINAL HYSTERECTOMY    . ANTERIOR APPROACH HEMI HIP ARTHROPLASTY Right 03/22/2017   Procedure: ANTERIOR APPROACH HEMI HIP ARTHROPLASTY;  Surgeon: Rod Can, MD;  Location: [facility];  Service: Orthopedics;  Laterality: Right;  . BREAST BIOPSY     right  . BUNIONECTOMY Bilateral    both, repeat x1  . CATARACT EXTRACTION Left   . HIP ARTHROPLASTY    . KNEE ARTHROPLASTY    . MASTECTOMY  1997   Right  . TONSILLECTOMY    . TOTAL KNEE ARTHROPLASTY Right 04/15/2013   Procedure: RIGHT TOTAL KNEE ARTHROPLASTY;  Surgeon: Gearlean Alf, MD;  Location: [facility];  Service: Orthopedics;  Laterality: Right;   Social History   Social History Narrative  . Not on file    Objective: Vital Signs: BP (!) 164/71 (BP Location: Left Arm, Patient Position: Sitting, Cuff Size: Normal)   Pulse 71   Ht 5' 7.5" (1.715 m)   Wt 136 lb 9.6 oz (62 kg)   BMI 21.08 kg/m    Physical Exam  Constitutional: She is oriented to person, place, and time. She appears well-developed and well-nourished.  HENT:  Head: Normocephalic and atraumatic.  Eyes: Conjunctivae and EOM are normal.  Neck: Normal range of motion.  Cardiovascular: Normal rate, regular rhythm, normal heart sounds and intact distal pulses.  Pulmonary/Chest: Effort normal and breath sounds normal.  Abdominal: Soft. Bowel sounds are normal.  Lymphadenopathy:    She has no cervical adenopathy.  Neurological: She is alert and oriented to person, place, and time.  Skin: Skin is warm and dry. Capillary refill takes less than 2 seconds.  Psychiatric:  She has a normal mood and affect. Her behavior is normal.  Nursing note and vitals reviewed.    Musculoskeletal Exam: C-spine limited ROM with lateral rotation.  Thoracic kyphosis noted.  Limited ROM of lumbar spine.  Lumbar scoliosis noted.  Shoulder joints, elbow joints, wrist joints, MCPs, PIPs, and DIPs good ROM with no synovitis.  PIP and DIP synovial thickening.  CMC joint synovial thickening bilaterally.  Limited ROM of both hip joints with discomfort.  Right hip replacement discomfort with ROM.  Right knee replacement doing well.  No warmth or effusion of knee joints.  No ankle joint tenderness or swelling.   CDAI Exam: CDAI Score: 0.4  Patient Global Assessment: 2 (mm); Provider Global Assessment: 2 (mm) Swollen: 0 ; Tender: 0  Joint Exam   Not documented   There is currently no information documented on the homunculus. Go to the Rheumatology activity and complete the homunculus joint exam.  Investigation: No additional findings.  Imaging:  No results found.  Recent Labs: Lab Results  Component Value Date   WBC 5.6 09/21/2017   HGB 13.2 09/21/2017   PLT 267 09/21/2017   NA 140 09/21/2017   K 4.9 09/21/2017   CL 103 09/21/2017   CO2 32 09/21/2017   GLUCOSE 82 09/21/2017   BUN 22 09/21/2017   CREATININE 0.74 09/21/2017   BILITOT 0.4 09/21/2017   ALKPHOS 77 11/16/2016   AST 23 09/21/2017   ALT 16 09/21/2017   PROT 6.6 09/21/2017   ALBUMIN 3.5 (L) 11/16/2016   CALCIUM 9.2 09/21/2017   GFRAA 84 09/21/2017    Speciality Comments: PLQ Eye Exam: 02/08/18 WNL @ [facility] Follow up in 1 year  Procedures:  No procedures performed Allergies: Amoxicillin and Latex   Assessment / Plan:     Visit Diagnoses: Rheumatoid arthritis of multiple sites with negative rheumatoid factor ([place]) - seronegative: She has no synovitis on exam.  She has not had any recent rheumatoid arthritis flares.  She has no pain or swelling in her hands or feet at this time.  No  rheumatoid nodules noted.  She is clinically doing well on Plaquenil 200 mg 1 tablet twice daily M-F.  She does not need any refills at this time.  She was advised to notify us if she develops increased joint pain or joint swelling.  She will follow-up in the office in 5 months.  High risk medication use - PLQ Eye Exam: 02/08/18 WNL @ [facility] Follow up in 1 year.  CBC and CMP will be drawn today to monitor for drug toxicity.- Plan: COMPLETE METABOLIC PANEL WITH GFR, CBC with Differential/Platelet  Primary osteoarthritis of both hands: She has PIP and DIP synovial thickening consistent with osteoarthritis of bilateral hands.  She has bilateral CMC joint synovial thickening but no tenderness was noted.  She has no discomfort in her hands at this time.  Joint protection and muscle strengthening were discussed.  History of total right hip replacement: She has limited range of motion with discomfort.  An x-ray of the pelvis was obtained today.  She had a right hip hemiarthroplasty performed in October 2018 by Dr. Lyla Glassing.   History of total right knee replacement: Doing well.  Good range of motion.  She is no discomfort at this time.  No warmth or effusion was noted.  DDD (degenerative disc disease), cervical: She has limited ROM of the C-spine.  She has intermittent neck discomfort and stiffness.  She performs range of motion exercises on a regular basis.  Chronic bilateral low back pain without sciatica: She has limited ROM and lumbar scoliosis on exam.  She has been having increased discomfort in her lower back and pelvis for the past several months.  X-rays of the lumbar spine were obtained today. X-rays revealed severe levoscoliosis, DDD, and severe facet joint arthropathy. She declined a referral to a back specialist at this time.  She will continue to go to exercise classes 3 times a week.- Plan: XR Lumbar Spine 2-3 Views, XR Pelvis 1-2 Views  Pelvic pain -She has been having  bilateral hip pain and pelvic pain. She has bilateral SI joint tenderness on exam.  She has a right hip hemiarthroplasty performed in October 2018 performed by Dr. Lyla Glassing. X-ray of the pelvis was obtained today.  No SI joint sclerosis or narrowing was noted. Plan: XR Pelvis 1-2 Views   Other medical conditions are listed as follows:   Osteopenia of multiple sites  History of vitamin D  deficiency  History of IBS  History of scoliosis  History of diverticulosis  History of breast cancer  History of thyroiditis    Orders: Orders Placed This Encounter  Procedures  . XR Lumbar Spine 2-3 Views  . XR Pelvis 1-2 Views  . COMPLETE METABOLIC PANEL WITH GFR  . CBC with Differential/Platelet   No orders of the defined types were placed in this encounter.   Face-to-face time spent with patient was 30 minutes. Greater than 50% of time was spent in counseling and coordination of care.  Follow-Up Instructions: Return in about 5 months (around 07/25/2018) for Rheumatoid arthritis, Osteoarthritis, DDD.   Ofilia Neas, PA-C   I examined and evaluated the patient with Dawn Sams PA. Patient has been having a lot of discomfort in her SI joints in her lower back.  The x-rays today obtained showed severe scoliosis and facet joint arthropathy.  She also has significant disc disease.  I discussed the findings at length with the patient.  She states the pain is is not severe enough to see a specialist.  Her rheumatoid arthritis is  well controlled.  The plan of care was discussed as noted above.  Bo Merino, MD  Note - This record has been created using Editor, commissioning.  Chart creation errors have been sought, but may not always  have been located. Such creation errors do not reflect on  the standard of medical care.

## 2018-02-22 ENCOUNTER — Ambulatory Visit (INDEPENDENT_AMBULATORY_CARE_PROVIDER_SITE_OTHER): Payer: Medicare Other

## 2018-02-22 ENCOUNTER — Ambulatory Visit (INDEPENDENT_AMBULATORY_CARE_PROVIDER_SITE_OTHER): Payer: Medicare Other | Admitting: Rheumatology

## 2018-02-22 ENCOUNTER — Encounter: Payer: Self-pay | Admitting: Rheumatology

## 2018-02-22 VITALS — BP 164/71 | HR 71 | Ht 67.5 in | Wt 136.6 lb

## 2018-02-22 DIAGNOSIS — M5136 Other intervertebral disc degeneration, lumbar region: Secondary | ICD-10-CM | POA: Diagnosis not present

## 2018-02-22 DIAGNOSIS — M19041 Primary osteoarthritis, right hand: Secondary | ICD-10-CM

## 2018-02-22 DIAGNOSIS — M19042 Primary osteoarthritis, left hand: Secondary | ICD-10-CM

## 2018-02-22 DIAGNOSIS — Z8739 Personal history of other diseases of the musculoskeletal system and connective tissue: Secondary | ICD-10-CM | POA: Diagnosis not present

## 2018-02-22 DIAGNOSIS — G8929 Other chronic pain: Secondary | ICD-10-CM

## 2018-02-22 DIAGNOSIS — Z8639 Personal history of other endocrine, nutritional and metabolic disease: Secondary | ICD-10-CM

## 2018-02-22 DIAGNOSIS — M5030 Other cervical disc degeneration, unspecified cervical region: Secondary | ICD-10-CM | POA: Diagnosis not present

## 2018-02-22 DIAGNOSIS — M8589 Other specified disorders of bone density and structure, multiple sites: Secondary | ICD-10-CM | POA: Diagnosis not present

## 2018-02-22 DIAGNOSIS — M51369 Other intervertebral disc degeneration, lumbar region without mention of lumbar back pain or lower extremity pain: Secondary | ICD-10-CM

## 2018-02-22 DIAGNOSIS — Z96651 Presence of right artificial knee joint: Secondary | ICD-10-CM

## 2018-02-22 DIAGNOSIS — R102 Pelvic and perineal pain unspecified side: Secondary | ICD-10-CM

## 2018-02-22 DIAGNOSIS — Z853 Personal history of malignant neoplasm of breast: Secondary | ICD-10-CM

## 2018-02-22 DIAGNOSIS — M545 Low back pain, unspecified: Secondary | ICD-10-CM

## 2018-02-22 DIAGNOSIS — M0609 Rheumatoid arthritis without rheumatoid factor, multiple sites: Secondary | ICD-10-CM | POA: Diagnosis not present

## 2018-02-22 DIAGNOSIS — Z8719 Personal history of other diseases of the digestive system: Secondary | ICD-10-CM

## 2018-02-22 DIAGNOSIS — Z79899 Other long term (current) drug therapy: Secondary | ICD-10-CM | POA: Diagnosis not present

## 2018-02-22 DIAGNOSIS — Z96641 Presence of right artificial hip joint: Secondary | ICD-10-CM | POA: Diagnosis not present

## 2018-02-23 LAB — COMPLETE METABOLIC PANEL WITH GFR
AG Ratio: 1.4 (calc) (ref 1.0–2.5)
ALT: 12 U/L (ref 6–29)
AST: 23 U/L (ref 10–35)
Albumin: 3.9 g/dL (ref 3.6–5.1)
Alkaline phosphatase (APISO): 87 U/L (ref 33–130)
BUN: 20 mg/dL (ref 7–25)
CALCIUM: 9.1 mg/dL (ref 8.6–10.4)
CO2: 31 mmol/L (ref 20–32)
Chloride: 101 mmol/L (ref 98–110)
Creat: 0.81 mg/dL (ref 0.60–0.88)
GFR, EST AFRICAN AMERICAN: 75 mL/min/{1.73_m2} (ref >=60)
GFR, Est Non African American: 65 mL/min/{1.73_m2} (ref >=60)
GLUCOSE: 95 mg/dL (ref 65–99)
Globulin: 2.7 g/dL (calc) (ref 1.9–3.7)
Potassium: 4.3 mmol/L (ref 3.5–5.3)
Sodium: 138 mmol/L (ref 135–146)
TOTAL PROTEIN: 6.6 g/dL (ref 6.1–8.1)
Total Bilirubin: 0.5 mg/dL (ref 0.2–1.2)

## 2018-02-23 LAB — CBC WITH DIFFERENTIAL/PLATELET
BASOS ABS: 82 {cells}/uL (ref 0–200)
Basophils Relative: 1.9 %
EOS ABS: 202 {cells}/uL (ref 15–500)
EOS PCT: 4.7 %
HEMATOCRIT: 42.3 % (ref 35.0–45.0)
HEMOGLOBIN: 14.1 g/dL (ref 11.7–15.5)
LYMPHS ABS: 1200 {cells}/uL (ref 850–3900)
MCH: 28 pg (ref 27.0–33.0)
MCHC: 33.3 g/dL (ref 32.0–36.0)
MCV: 84.1 fL (ref 80.0–100.0)
MPV: 10.3 fL (ref 7.5–12.5)
Monocytes Relative: 13.3 %
Neutro Abs: 2245 cells/uL (ref 1500–7800)
Neutrophils Relative %: 52.2 %
Platelets: 239 10*3/uL (ref 140–400)
RBC: 5.03 10*6/uL (ref 3.80–5.10)
RDW: 12.6 % (ref 11.0–15.0)
Total Lymphocyte: 27.9 %
WBC: 4.3 10*3/uL (ref 3.8–10.8)
WBCMIX: 572 {cells}/uL (ref 200–950)

## 2018-02-23 NOTE — Progress Notes (Signed)
Labs are WNL.

## 2018-03-19 DIAGNOSIS — H401133 Primary open-angle glaucoma, bilateral, severe stage: Secondary | ICD-10-CM | POA: Diagnosis not present

## 2018-03-19 DIAGNOSIS — H1131 Conjunctival hemorrhage, right eye: Secondary | ICD-10-CM | POA: Diagnosis not present

## 2018-03-19 DIAGNOSIS — Z79899 Other long term (current) drug therapy: Secondary | ICD-10-CM | POA: Diagnosis not present

## 2018-03-19 DIAGNOSIS — H04123 Dry eye syndrome of bilateral lacrimal glands: Secondary | ICD-10-CM | POA: Diagnosis not present

## 2018-03-22 DIAGNOSIS — Z23 Encounter for immunization: Secondary | ICD-10-CM | POA: Diagnosis not present

## 2018-04-03 DIAGNOSIS — I10 Essential (primary) hypertension: Secondary | ICD-10-CM | POA: Diagnosis not present

## 2018-04-03 DIAGNOSIS — R002 Palpitations: Secondary | ICD-10-CM | POA: Diagnosis not present

## 2018-04-03 DIAGNOSIS — Z6820 Body mass index (BMI) 20.0-20.9, adult: Secondary | ICD-10-CM | POA: Diagnosis not present

## 2018-04-03 DIAGNOSIS — R350 Frequency of micturition: Secondary | ICD-10-CM | POA: Diagnosis not present

## 2018-04-03 DIAGNOSIS — R82998 Other abnormal findings in urine: Secondary | ICD-10-CM | POA: Diagnosis not present

## 2018-04-05 DIAGNOSIS — L821 Other seborrheic keratosis: Secondary | ICD-10-CM | POA: Diagnosis not present

## 2018-04-05 DIAGNOSIS — L570 Actinic keratosis: Secondary | ICD-10-CM | POA: Diagnosis not present

## 2018-04-05 DIAGNOSIS — D692 Other nonthrombocytopenic purpura: Secondary | ICD-10-CM | POA: Diagnosis not present

## 2018-04-05 DIAGNOSIS — I788 Other diseases of capillaries: Secondary | ICD-10-CM | POA: Diagnosis not present

## 2018-04-05 DIAGNOSIS — L814 Other melanin hyperpigmentation: Secondary | ICD-10-CM | POA: Diagnosis not present

## 2018-04-18 DIAGNOSIS — H52203 Unspecified astigmatism, bilateral: Secondary | ICD-10-CM | POA: Diagnosis not present

## 2018-04-18 DIAGNOSIS — H04123 Dry eye syndrome of bilateral lacrimal glands: Secondary | ICD-10-CM | POA: Diagnosis not present

## 2018-04-18 DIAGNOSIS — H1033 Unspecified acute conjunctivitis, bilateral: Secondary | ICD-10-CM | POA: Diagnosis not present

## 2018-04-20 ENCOUNTER — Telehealth: Payer: Self-pay | Admitting: *Deleted

## 2018-04-20 ENCOUNTER — Inpatient Hospital Stay: Payer: Medicare Other | Admitting: Oncology

## 2018-04-20 NOTE — Telephone Encounter (Signed)
Daughter called to report patient received call from the office and does not know why she was called.  Called daughter back and made her aware it was most likely a reminder call of the appointment today. She apologized for missing the appointment. Informed her that per Dr. Benay Spice, there is no urgency to reschedule. Daughter will talk with patient about this-she added that her mother is getting much more forgetful and confused.

## 2018-04-27 DIAGNOSIS — R1030 Lower abdominal pain, unspecified: Secondary | ICD-10-CM | POA: Diagnosis not present

## 2018-04-27 DIAGNOSIS — K589 Irritable bowel syndrome without diarrhea: Secondary | ICD-10-CM | POA: Diagnosis not present

## 2018-04-27 DIAGNOSIS — Z6820 Body mass index (BMI) 20.0-20.9, adult: Secondary | ICD-10-CM | POA: Diagnosis not present

## 2018-04-27 DIAGNOSIS — R52 Pain, unspecified: Secondary | ICD-10-CM | POA: Diagnosis not present

## 2018-05-09 DIAGNOSIS — H401133 Primary open-angle glaucoma, bilateral, severe stage: Secondary | ICD-10-CM | POA: Diagnosis not present

## 2018-05-09 DIAGNOSIS — H04123 Dry eye syndrome of bilateral lacrimal glands: Secondary | ICD-10-CM | POA: Diagnosis not present

## 2018-05-19 ENCOUNTER — Other Ambulatory Visit: Payer: Self-pay | Admitting: Rheumatology

## 2018-05-21 NOTE — Telephone Encounter (Signed)
Last visit: 02/22/18 Next visit: 08/30/18 Labs: 02/22/18 WNL PLQ Eye Exam: 02/08/18 WNL  Okay to refill per Dr. Estanislado Pandy

## 2018-05-25 DIAGNOSIS — L300 Nummular dermatitis: Secondary | ICD-10-CM | POA: Diagnosis not present

## 2018-05-25 DIAGNOSIS — L570 Actinic keratosis: Secondary | ICD-10-CM | POA: Diagnosis not present

## 2018-05-25 DIAGNOSIS — L720 Epidermal cyst: Secondary | ICD-10-CM | POA: Diagnosis not present

## 2018-07-26 DIAGNOSIS — L570 Actinic keratosis: Secondary | ICD-10-CM | POA: Diagnosis not present

## 2018-07-26 DIAGNOSIS — L821 Other seborrheic keratosis: Secondary | ICD-10-CM | POA: Diagnosis not present

## 2018-07-26 DIAGNOSIS — L308 Other specified dermatitis: Secondary | ICD-10-CM | POA: Diagnosis not present

## 2018-07-30 ENCOUNTER — Other Ambulatory Visit: Payer: Self-pay | Admitting: Rheumatology

## 2018-07-30 NOTE — Telephone Encounter (Signed)
Last visit: 02/22/18 Next visit: 08/30/18 Labs: 02/22/18 WNL PLQ Eye Exam: 02/08/18 WNL  Okay to refill per Dr. Estanislado Pandy

## 2018-08-02 DIAGNOSIS — M0600 Rheumatoid arthritis without rheumatoid factor, unspecified site: Secondary | ICD-10-CM | POA: Diagnosis not present

## 2018-08-02 DIAGNOSIS — M810 Age-related osteoporosis without current pathological fracture: Secondary | ICD-10-CM | POA: Diagnosis not present

## 2018-08-02 DIAGNOSIS — M1990 Unspecified osteoarthritis, unspecified site: Secondary | ICD-10-CM | POA: Diagnosis not present

## 2018-08-02 DIAGNOSIS — Z6820 Body mass index (BMI) 20.0-20.9, adult: Secondary | ICD-10-CM | POA: Diagnosis not present

## 2018-08-09 DIAGNOSIS — Z85828 Personal history of other malignant neoplasm of skin: Secondary | ICD-10-CM | POA: Diagnosis not present

## 2018-08-09 DIAGNOSIS — L570 Actinic keratosis: Secondary | ICD-10-CM | POA: Diagnosis not present

## 2018-08-09 DIAGNOSIS — L905 Scar conditions and fibrosis of skin: Secondary | ICD-10-CM | POA: Diagnosis not present

## 2018-08-09 DIAGNOSIS — Z23 Encounter for immunization: Secondary | ICD-10-CM | POA: Diagnosis not present

## 2018-08-13 DIAGNOSIS — M25562 Pain in left knee: Secondary | ICD-10-CM | POA: Diagnosis not present

## 2018-08-13 DIAGNOSIS — M545 Low back pain: Secondary | ICD-10-CM | POA: Diagnosis not present

## 2018-08-13 DIAGNOSIS — M25552 Pain in left hip: Secondary | ICD-10-CM | POA: Diagnosis not present

## 2018-08-16 NOTE — Progress Notes (Deleted)
Office Visit Note  Patient: Dawn Diaz             Date of Birth: June 26, 1926           MRN: [id number]             PCP: Shon Baton, MD Referring: Shon Baton, MD Visit Date: 08/30/2018 Occupation: @GUAROCC @  Subjective:  No chief complaint on file.  Plaquenil 200 mg twice daily Monday and Friday only. Last Plaquenil eye exam normal on 02/08/18 and will monitor yearly. Most recent CBC/CMP within normal limits on 02/22/18. Due for CBC/CMP today and will monitor every 5 months.  History of Present Illness: Dawn Diaz is a 85 y.o. female ***   Activities of Daily Living:  Patient reports morning stiffness for *** {minute/hour:19697}.   Patient {ACTIONS;DENIES/REPORTS:21021675::"Denies"} nocturnal pain.  Difficulty dressing/grooming: {ACTIONS;DENIES/REPORTS:21021675::"Denies"} Difficulty climbing stairs: {ACTIONS;DENIES/REPORTS:21021675::"Denies"} Difficulty getting out of chair: {ACTIONS;DENIES/REPORTS:21021675::"Denies"} Difficulty using hands for taps, buttons, cutlery, and/or writing: {ACTIONS;DENIES/REPORTS:21021675::"Denies"}  No Rheumatology ROS completed.   PMFS History:  Patient Active Problem List   Diagnosis Date Noted  . Depression, major, single episode, moderate ([place]) 03/28/2017  . Pulmonary nodule seen on imaging study 03/28/2017  . Acquired leg length discrepancy 03/28/2017  . Closed displaced fracture of right femoral neck ([place]) 03/22/2017  . Breast cancer, stage 1 ([place])   . Rheumatoid arthritis, seronegative, multiple sites ([place]) 11/15/2016  . Osteopenia 11/15/2016  . Primary osteoarthritis of both hands 11/15/2016  . High risk medications (not anticoagulants) long-term use 11/15/2016  . Family history of malignant neoplasm of gastrointestinal tract 12/25/2013  . Constipation 04/24/2013  . Glaucoma 04/24/2013  . OA (osteoarthritis) of knee 04/15/2013  . History of breast cancer in female 12/20/2010  . DIVERTICULOSIS-COLON 08/06/2009  . IRRITABLE  BOWEL SYNDROME 08/06/2009    Past Medical History:  Diagnosis Date  . Anxiety disorder   . Bacterial overgrowth syndrome   . Breast cancer, stage 1 [facility])    '97/recurrent '05 right breast(s/p mastectomy)  . Bunion   . Depression   . Diverticulitis   . GERD (gastroesophageal reflux disease)   . Glaucoma   . Hiatal hernia   . History of TIAs    - 13 yrs ago"brief memory loss"  . Irritable bowel syndrome   . Osteoporosis   . Ovarian cyst     Family History  Problem Relation Age of Onset  . Colon cancer Sister 70       Died at 60  . Kidney cancer Father 84  . Pancreatic cancer Mother 29  . Pancreatic cancer Maternal Grandmother 92  . Cancer Paternal Grandfather        died from "abdominal ca" at young age  . Breast cancer Neg Hx    Past Surgical History:  Procedure Laterality Date  . ABDOMINAL HYSTERECTOMY    . ANTERIOR APPROACH HEMI HIP ARTHROPLASTY Right 03/22/2017   Procedure: ANTERIOR APPROACH HEMI HIP ARTHROPLASTY;  Surgeon: Rod Can, MD;  Location: [facility];  Service: Orthopedics;  Laterality: Right;  . BREAST BIOPSY     right  . BUNIONECTOMY Bilateral    both, repeat x1  . CATARACT EXTRACTION Left   . HIP ARTHROPLASTY    . KNEE ARTHROPLASTY    . MASTECTOMY  1997   Right  . TONSILLECTOMY    . TOTAL KNEE ARTHROPLASTY Right 04/15/2013   Procedure: RIGHT TOTAL KNEE ARTHROPLASTY;  Surgeon: Gearlean Alf, MD;  Location: [facility];  Service: Orthopedics;  Laterality: Right;   Social  History   Social History Narrative  . Not on file   Immunization History  Administered Date(s) Administered  . Pneumococcal Polysaccharide-23 09/09/2008  . Td 11/20/2012     Objective: Vital Signs: There were no vitals taken for this visit.   Physical Exam   Musculoskeletal Exam: ***  CDAI Exam: CDAI Score: Not documented Patient Global Assessment: Not documented; Provider Global Assessment: Not documented Swollen: Not documented; Tender: Not documented Joint Exam    Not documented   There is currently no information documented on the homunculus. Go to the Rheumatology activity and complete the homunculus joint exam.  Investigation: No additional findings.  Imaging: No results found.  Recent Labs: Lab Results  Component Value Date   WBC 4.3 02/22/2018   HGB 14.1 02/22/2018   PLT 239 02/22/2018   NA 138 02/22/2018   K 4.3 02/22/2018   CL 101 02/22/2018   CO2 31 02/22/2018   GLUCOSE 95 02/22/2018   BUN 20 02/22/2018   CREATININE 0.81 02/22/2018   BILITOT 0.5 02/22/2018   ALKPHOS 77 11/16/2016   AST 23 02/22/2018   ALT 12 02/22/2018   PROT 6.6 02/22/2018   ALBUMIN 3.5 (L) 11/16/2016   CALCIUM 9.1 02/22/2018   GFRAA 75 02/22/2018    Speciality Comments: PLQ Eye Exam: 02/08/18 WNL @ [facility] Follow up in 1 year  Procedures:  No procedures performed Allergies: Amoxicillin and Latex   Assessment / Plan:     Visit Diagnoses: No diagnosis found.   Orders: No orders of the defined types were placed in this encounter.  No orders of the defined types were placed in this encounter.   Face-to-face time spent with patient was *** minutes. Greater than 50% of time was spent in counseling and coordination of care.  Follow-Up Instructions: No follow-ups on file.   Earnestine Mealing, CMA  Note - This record has been created using Editor, commissioning.  Chart creation errors have been sought, but may not always  have been located. Such creation errors do not reflect on  the standard of medical care.

## 2018-08-29 DIAGNOSIS — M4180 Other forms of scoliosis, site unspecified: Secondary | ICD-10-CM | POA: Diagnosis not present

## 2018-08-29 DIAGNOSIS — M5136 Other intervertebral disc degeneration, lumbar region: Secondary | ICD-10-CM | POA: Diagnosis not present

## 2018-08-29 DIAGNOSIS — M545 Low back pain: Secondary | ICD-10-CM | POA: Diagnosis not present

## 2018-08-30 ENCOUNTER — Ambulatory Visit: Payer: Medicare Other | Admitting: Rheumatology

## 2018-08-30 ENCOUNTER — Telehealth: Payer: Self-pay | Admitting: Rheumatology

## 2018-08-30 NOTE — Telephone Encounter (Signed)
Patient left a voicemail requesting a return call.   °

## 2018-08-30 NOTE — Telephone Encounter (Signed)
FYI- Patient states she saw Dr. Suella Broad yesterday. Patient states she has been referred for PT. Patient will be seeing Levada Dy at [facility] for left lower back pain and mid back pain as well as her knee.

## 2018-09-07 ENCOUNTER — Telehealth: Payer: Self-pay | Admitting: Pharmacy Technician

## 2018-09-07 MED ORDER — HYDROXYCHLOROQUINE SULFATE 200 MG PO TABS: ORAL_TABLET | ORAL | 0 refills | Status: DC

## 2018-09-07 NOTE — Telephone Encounter (Signed)
Patient is on Plaquenil. Please send in prescription to [facility].   Called patient and advised

## 2018-10-22 ENCOUNTER — Other Ambulatory Visit: Payer: Self-pay | Admitting: Rheumatology

## 2018-10-22 DIAGNOSIS — M0609 Rheumatoid arthritis without rheumatoid factor, multiple sites: Secondary | ICD-10-CM

## 2018-10-22 NOTE — Telephone Encounter (Signed)
Last Visit: 02/22/2018 Next Visit: message sent to the front desk to schedule.  Labs: 02/22/2018 Eye exam: 02/08/2018  attempted to contact patient and left message on machine to advise patient she is due for labs.   Okay to refill per Dr. Estanislado Pandy.

## 2018-10-29 ENCOUNTER — Telehealth: Payer: Self-pay | Admitting: Rheumatology

## 2018-10-29 NOTE — Telephone Encounter (Signed)
I LMOM for patient to call, and schedule next follow up appt.

## 2018-10-29 NOTE — Telephone Encounter (Signed)
-----   Message from Wilson sent at 10/22/2018  9:49 AM EDT ----- Patient is due for a follow up with Dr. Estanislado Pandy. Please call to schedule. Thanks!

## 2018-11-09 ENCOUNTER — Ambulatory Visit: Payer: Medicare Other | Admitting: Podiatry

## 2018-11-09 DIAGNOSIS — M1712 Unilateral primary osteoarthritis, left knee: Secondary | ICD-10-CM | POA: Diagnosis not present

## 2018-11-26 ENCOUNTER — Encounter: Payer: Self-pay | Admitting: Gastroenterology

## 2018-12-03 DIAGNOSIS — H01002 Unspecified blepharitis right lower eyelid: Secondary | ICD-10-CM | POA: Diagnosis not present

## 2018-12-03 DIAGNOSIS — H01005 Unspecified blepharitis left lower eyelid: Secondary | ICD-10-CM | POA: Diagnosis not present

## 2018-12-03 DIAGNOSIS — H0015 Chalazion left lower eyelid: Secondary | ICD-10-CM | POA: Diagnosis not present

## 2018-12-07 DIAGNOSIS — H0100A Unspecified blepharitis right eye, upper and lower eyelids: Secondary | ICD-10-CM | POA: Diagnosis not present

## 2018-12-07 DIAGNOSIS — H0015 Chalazion left lower eyelid: Secondary | ICD-10-CM | POA: Diagnosis not present

## 2018-12-07 DIAGNOSIS — H0100B Unspecified blepharitis left eye, upper and lower eyelids: Secondary | ICD-10-CM | POA: Diagnosis not present

## 2018-12-14 ENCOUNTER — Other Ambulatory Visit: Payer: Self-pay | Admitting: Internal Medicine

## 2018-12-14 DIAGNOSIS — Z1231 Encounter for screening mammogram for malignant neoplasm of breast: Secondary | ICD-10-CM

## 2018-12-14 NOTE — Progress Notes (Signed)
Office Visit Note  Patient: Dawn Diaz             Date of Birth: [birth year]           MRN: [id number]             PCP: Shon Baton, MD Referring: Shon Baton, MD Visit Date: 12/27/2018 Occupation: @GUAROCC @  Subjective:  Medication monitoring    History of Present Illness: Dawn Diaz is a 85 y.o. female with history of seronegative rheumatoid arthritis, osteoarthritis, and DDD.  Patient is taking Plaquenil 200 mg 1 tablet twice daily Monday through Friday.  She reports that she often misses evening dose due to forgetting.  She denies any recent rheumatoid arthritis flares.  She denies any joint pain or joint swelling at this time.  She denies any morning stiffness.  She continues to exercise 5 times a week.  She continues to take a vitamin D supplement on a daily basis.  She states that she has a colonoscopy scheduled on 01/16/2019 by Dr. Fuller Plan.  She denies any concerns or a need for refills at this time.   Activities of Daily Living:  Patient reports morning stiffness for 0 minutes.   Patient Denies nocturnal pain.  Difficulty dressing/grooming: Denies Difficulty climbing stairs: Reports Difficulty getting out of chair: Reports Difficulty using hands for taps, buttons, cutlery, and/or writing: Denies  Review of Systems  Constitutional: Positive for fatigue.  HENT: Positive for mouth dryness. Negative for mouth sores and nose dryness.   Eyes: Negative for itching and dryness.  Respiratory: Negative for shortness of breath, wheezing and difficulty breathing.   Cardiovascular: Negative for chest pain, palpitations and swelling in legs/feet.  Gastrointestinal: Negative for abdominal pain, constipation and diarrhea.  Endocrine: Negative for increased urination.  Genitourinary: Negative for painful urination and pelvic pain.  Musculoskeletal: Positive for arthralgias and joint pain. Negative for joint swelling and morning stiffness.  Skin: Negative for rash.   Allergic/Immunologic: Negative for susceptible to infections.  Neurological: Negative for dizziness, numbness, headaches and weakness.  Hematological: Positive for bruising/bleeding tendency.  Psychiatric/Behavioral: The patient is not nervous/anxious.     PMFS History:  Patient Active Problem List   Diagnosis Date Noted  . Depression, major, single episode, moderate ([place]) 03/28/2017  . Pulmonary nodule seen on imaging study 03/28/2017  . Acquired leg length discrepancy 03/28/2017  . Closed displaced fracture of right femoral neck ([place]) 03/22/2017  . Breast cancer, stage 1 ([place])   . Rheumatoid arthritis, seronegative, multiple sites ([place]) 11/15/2016  . Osteopenia 11/15/2016  . Primary osteoarthritis of both hands 11/15/2016  . High risk medications (not anticoagulants) long-term use 11/15/2016  . Family history of malignant neoplasm of gastrointestinal tract 12/25/2013  . Constipation 04/24/2013  . Glaucoma 04/24/2013  . OA (osteoarthritis) of knee 04/15/2013  . History of breast cancer in female 12/20/2010  . DIVERTICULOSIS-COLON 08/06/2009  . IRRITABLE BOWEL SYNDROME 08/06/2009    Past Medical History:  Diagnosis Date  . Anxiety disorder   . Bacterial overgrowth syndrome   . Breast cancer, stage 1 [facility])    '97/recurrent '05 right breast(s/p mastectomy)  . Bunion   . Depression   . Diverticulitis   . GERD (gastroesophageal reflux disease)   . Glaucoma   . Hiatal hernia   . History of TIAs    - 13 yrs ago"brief memory loss"  . Insomnia   . Irritable bowel syndrome   . Osteoporosis   . Ovarian cyst     Family History  Problem Relation Age of Onset  . Colon cancer Sister 61       Died at 73  . Kidney cancer Father 8  . Pancreatic cancer Mother 21  . Pancreatic cancer Maternal Grandmother 53  . Cancer Paternal Grandfather        died from "abdominal ca" at young age  . Breast cancer Neg Hx    Past Surgical History:  Procedure Laterality Date  . ABDOMINAL  HYSTERECTOMY    . ANTERIOR APPROACH HEMI HIP ARTHROPLASTY Right 03/22/2017   Procedure: ANTERIOR APPROACH HEMI HIP ARTHROPLASTY;  Surgeon: Rod Can, MD;  Location: [facility];  Service: Orthopedics;  Laterality: Right;  . BREAST BIOPSY     right  . BUNIONECTOMY Bilateral    both, repeat x1  . CATARACT EXTRACTION Left   . HIP ARTHROPLASTY    . KNEE ARTHROPLASTY    . MASTECTOMY  1997   Right  . TONSILLECTOMY    . TOTAL KNEE ARTHROPLASTY Right 04/15/2013   Procedure: RIGHT TOTAL KNEE ARTHROPLASTY;  Surgeon: Gearlean Alf, MD;  Location: [facility];  Service: Orthopedics;  Laterality: Right;   Social History   Social History Narrative  . Not on file   Immunization History  Administered Date(s) Administered  . Influenza, High Dose Seasonal PF 03/22/2018  . Influenza-Unspecified 03/21/2017  . Pneumococcal Polysaccharide-23 09/09/2008  . Td 11/20/2012     Objective: Vital Signs: BP (!) 146/70 (BP Location: Left Arm, Patient Position: Sitting, Cuff Size: Normal)   Pulse 61   Resp 13   Ht 5' 7.5" (1.715 m)   Wt 135 lb (61.2 kg)   BMI 20.83 kg/m    Physical Exam Vitals signs and nursing note reviewed.  Constitutional:      Appearance: She is well-developed.  HENT:     Head: Normocephalic and atraumatic.  Eyes:     Conjunctiva/sclera: Conjunctivae normal.  Neck:     Musculoskeletal: Normal range of motion.  Cardiovascular:     Rate and Rhythm: Normal rate and regular rhythm.     Heart sounds: Normal heart sounds.  Pulmonary:     Effort: Pulmonary effort is normal.     Breath sounds: Normal breath sounds.  Abdominal:     General: Bowel sounds are normal.     Palpations: Abdomen is soft.  Lymphadenopathy:     Cervical: No cervical adenopathy.  Skin:    General: Skin is warm and dry.     Capillary Refill: Capillary refill takes less than 2 seconds.  Neurological:     Mental Status: She is alert and oriented to person, place, and time.  Psychiatric:        Behavior:  Behavior normal.      Musculoskeletal Exam: Shoulder joints, elbow joints, and wrist joints have good ROM with no tenderness or inflammation. She has PIP and DIP synovial thickening consistent with osteoarthritis of both hands. Contractures of bilateral 5th PIP joints. Bilateral CMC joint synovial thickening.  Right hip replacement has good ROM with no discomfort. Right knee replacement has good ROM with no warmth or effusion. Left knee joint has a good ROM with no warmth or effusion.  Ankle joints, MTPs, PIPs, and DIPs good ROM with no synovitis.    CDAI Exam: CDAI Score: 0.2  Patient Global: 1 mm; Provider Global: 1 mm Swollen: 0 ; Tender: 0  Joint Exam   No joint exam has been documented for this visit   There is currently no information documented on the homunculus.  Go to the Rheumatology activity and complete the homunculus joint exam.  Investigation: No additional findings.  Imaging: No results found.  Recent Labs: Lab Results  Component Value Date   WBC 4.3 02/22/2018   HGB 14.1 02/22/2018   PLT 239 02/22/2018   NA 138 02/22/2018   K 4.3 02/22/2018   CL 101 02/22/2018   CO2 31 02/22/2018   GLUCOSE 95 02/22/2018   BUN 20 02/22/2018   CREATININE 0.81 02/22/2018   BILITOT 0.5 02/22/2018   ALKPHOS 77 11/16/2016   AST 23 02/22/2018   ALT 12 02/22/2018   PROT 6.6 02/22/2018   ALBUMIN 3.5 (L) 11/16/2016   CALCIUM 9.1 02/22/2018   GFRAA 75 02/22/2018    Speciality Comments: PLQ Eye Exam: 02/08/18 WNL @ [facility] Follow up in 1 year  Procedures:  No procedures performed Allergies: Amoxicillin and Latex      Assessment / Plan:     Visit Diagnoses: Rheumatoid arthritis of multiple sites with negative rheumatoid factor ([place]) - seronegative: She has no synovitis on exam.  She has not had any recent rheumatoid arthritis flares. She has no joint pain, joint swelling, or morning stiffness.  She is clinically doing well on Plaquenil 200 mg 1 tablet by mouth  twice daily M-F only.  She occasionally forgets to take the evening dose.  We will reduce plaquenil to 200 mg 1 tablet by mouth daily.  She was advised to notify us if she develops increased joint pain or joint swelling.  She will follow up in 5 months.   High risk medication use -Plaquenil 200 mg 1 tablet by mouth daily. Last Plaquenil eye exam normal on 02/08/2018.  Last CBC/CMP within normal limits on 02/22/2018.  She is overdue for labs.  CBC/CMP ordered for today and will monitor every 5 months.  Standing orders placed.  She received a high-dose flu vaccine in October and previously Pneumovax 23.    Plan: CBC with Differential/Platelet, COMPLETE METABOLIC PANEL WITH GFR,   Primary osteoarthritis of both hands - She has PIP and DIP synovial thickening consistent with osteoarthritis of both hands.  Bilateral CMC joint synovial thickening.  No synovitis or tenderness on exam.  Joint protection and muscle strengthening were discussed.   History of total right hip replacement - Doing well.  She has good ROM with no discomfort.   History of total right knee replacement - Doing well.  Good ROM with no discomfort.  No warmth or effusion noted.   DDD (degenerative disc disease), cervical - C-spine has limited ROM with no discomfort.  She has no symptoms of radiculopathy.   DDD (degenerative disc disease), lumbar - She has good ROM with no discomfort.   Osteopenia of multiple sites - history of right hip fracture and replacement.  She is taking a daily vitamin D supplement daily. She was treated with Arimidex and tamoxifen for breast cancer in the past. No DEXA on file.  Recommendation from [facility] for Prolia/Forteo in 2018 after fracture.  Advised patient to discuss with Dr. Virgina Jock in the past.  Other medical conditions are listed as follows:   History of scoliosis   History of diverticulosis  History of breast cancer   History of IBS   History of thyroiditis   History of vitamin D deficiency  - She continues to take a vitamin D supplement.   Orders: Orders Placed This Encounter  Procedures  . CBC with Differential/Platelet  . COMPLETE METABOLIC PANEL WITH GFR   No orders of  the defined types were placed in this encounter.     Follow-Up Instructions: Return in about 5 months (around 05/29/2019) for Rheumatoid arthritis, Osteoarthritis, DDD.   Ofilia Neas, PA-C  Note - This record has been created using Dragon software.  Chart creation errors have been sought, but may not always  have been located. Such creation errors do not reflect on  the standard of medical care.

## 2018-12-18 ENCOUNTER — Ambulatory Visit: Payer: Medicare Other | Admitting: Gastroenterology

## 2018-12-18 DIAGNOSIS — L300 Nummular dermatitis: Secondary | ICD-10-CM | POA: Diagnosis not present

## 2018-12-18 DIAGNOSIS — D2272 Melanocytic nevi of left lower limb, including hip: Secondary | ICD-10-CM | POA: Diagnosis not present

## 2018-12-18 DIAGNOSIS — R234 Changes in skin texture: Secondary | ICD-10-CM | POA: Diagnosis not present

## 2018-12-18 DIAGNOSIS — Z85828 Personal history of other malignant neoplasm of skin: Secondary | ICD-10-CM | POA: Diagnosis not present

## 2018-12-18 DIAGNOSIS — Z86018 Personal history of other benign neoplasm: Secondary | ICD-10-CM | POA: Diagnosis not present

## 2018-12-20 DIAGNOSIS — L84 Corns and callosities: Secondary | ICD-10-CM | POA: Diagnosis not present

## 2018-12-27 ENCOUNTER — Ambulatory Visit (INDEPENDENT_AMBULATORY_CARE_PROVIDER_SITE_OTHER): Payer: Medicare Other | Admitting: Physician Assistant

## 2018-12-27 ENCOUNTER — Other Ambulatory Visit: Payer: Self-pay

## 2018-12-27 ENCOUNTER — Encounter: Payer: Self-pay | Admitting: Physician Assistant

## 2018-12-27 VITALS — BP 146/70 | HR 61 | Resp 13 | Ht 67.5 in | Wt 135.0 lb

## 2018-12-27 DIAGNOSIS — M19041 Primary osteoarthritis, right hand: Secondary | ICD-10-CM

## 2018-12-27 DIAGNOSIS — Z853 Personal history of malignant neoplasm of breast: Secondary | ICD-10-CM

## 2018-12-27 DIAGNOSIS — Z8639 Personal history of other endocrine, nutritional and metabolic disease: Secondary | ICD-10-CM | POA: Diagnosis not present

## 2018-12-27 DIAGNOSIS — Z96651 Presence of right artificial knee joint: Secondary | ICD-10-CM | POA: Diagnosis not present

## 2018-12-27 DIAGNOSIS — M0609 Rheumatoid arthritis without rheumatoid factor, multiple sites: Secondary | ICD-10-CM | POA: Diagnosis not present

## 2018-12-27 DIAGNOSIS — M5030 Other cervical disc degeneration, unspecified cervical region: Secondary | ICD-10-CM | POA: Diagnosis not present

## 2018-12-27 DIAGNOSIS — M8589 Other specified disorders of bone density and structure, multiple sites: Secondary | ICD-10-CM

## 2018-12-27 DIAGNOSIS — Z8739 Personal history of other diseases of the musculoskeletal system and connective tissue: Secondary | ICD-10-CM

## 2018-12-27 DIAGNOSIS — Z79899 Other long term (current) drug therapy: Secondary | ICD-10-CM

## 2018-12-27 DIAGNOSIS — M5136 Other intervertebral disc degeneration, lumbar region: Secondary | ICD-10-CM

## 2018-12-27 DIAGNOSIS — Z8719 Personal history of other diseases of the digestive system: Secondary | ICD-10-CM

## 2018-12-27 DIAGNOSIS — Z96641 Presence of right artificial hip joint: Secondary | ICD-10-CM

## 2018-12-27 DIAGNOSIS — M51369 Other intervertebral disc degeneration, lumbar region without mention of lumbar back pain or lower extremity pain: Secondary | ICD-10-CM

## 2018-12-27 DIAGNOSIS — M19042 Primary osteoarthritis, left hand: Secondary | ICD-10-CM

## 2018-12-28 LAB — CBC WITH DIFFERENTIAL/PLATELET
Absolute Monocytes: 554 cells/uL (ref 200–950)
Basophils Absolute: 101 cells/uL (ref 0–200)
Basophils Relative: 1.8 %
Eosinophils Absolute: 291 cells/uL (ref 15–500)
Eosinophils Relative: 5.2 %
HCT: 44.7 % (ref 35.0–45.0)
Hemoglobin: 14.6 g/dL (ref 11.7–15.5)
Lymphs Abs: 1198 cells/uL (ref 850–3900)
MCH: 29.9 pg (ref 27.0–33.0)
MCHC: 32.7 g/dL (ref 32.0–36.0)
MCV: 91.4 fL (ref 80.0–100.0)
MPV: 10 fL (ref 7.5–12.5)
Monocytes Relative: 9.9 %
Neutro Abs: 3455 cells/uL (ref 1500–7800)
Neutrophils Relative %: 61.7 %
Platelets: 225 10*3/uL (ref 140–400)
RBC: 4.89 10*6/uL (ref 3.80–5.10)
RDW: 12.3 % (ref 11.0–15.0)
Total Lymphocyte: 21.4 %
WBC: 5.6 10*3/uL (ref 3.8–10.8)

## 2018-12-28 LAB — COMPLETE METABOLIC PANEL WITH GFR
AG Ratio: 1.6 (calc) (ref 1.0–2.5)
ALT: 14 U/L (ref 6–29)
AST: 22 U/L (ref 10–35)
Albumin: 3.9 g/dL (ref 3.6–5.1)
Alkaline phosphatase (APISO): 81 U/L (ref 37–153)
BUN: 19 mg/dL (ref 7–25)
CO2: 32 mmol/L (ref 20–32)
Calcium: 9.6 mg/dL (ref 8.6–10.4)
Chloride: 100 mmol/L (ref 98–110)
Creat: 0.85 mg/dL (ref 0.60–0.88)
GFR, Est African American: 70 mL/min/{1.73_m2} (ref >=60)
GFR, Est Non African American: 61 mL/min/{1.73_m2} (ref >=60)
Globulin: 2.5 g/dL (calc) (ref 1.9–3.7)
Glucose, Bld: 89 mg/dL (ref 65–99)
Potassium: 4.6 mmol/L (ref 3.5–5.3)
Sodium: 140 mmol/L (ref 135–146)
Total Bilirubin: 0.6 mg/dL (ref 0.2–1.2)
Total Protein: 6.4 g/dL (ref 6.1–8.1)

## 2018-12-28 NOTE — Progress Notes (Signed)
CBC and CMP WNL

## 2019-01-01 DIAGNOSIS — R58 Hemorrhage, not elsewhere classified: Secondary | ICD-10-CM | POA: Diagnosis not present

## 2019-01-01 DIAGNOSIS — L570 Actinic keratosis: Secondary | ICD-10-CM | POA: Diagnosis not present

## 2019-01-07 DIAGNOSIS — I10 Essential (primary) hypertension: Secondary | ICD-10-CM | POA: Diagnosis not present

## 2019-01-07 DIAGNOSIS — M810 Age-related osteoporosis without current pathological fracture: Secondary | ICD-10-CM | POA: Diagnosis not present

## 2019-01-07 LAB — BASIC METABOLIC PANEL
BUN: 16 (ref 4–21)
Glucose: 101

## 2019-01-07 LAB — TSH: TSH: 1.1 (ref <=5.90)

## 2019-01-10 DIAGNOSIS — R82998 Other abnormal findings in urine: Secondary | ICD-10-CM | POA: Diagnosis not present

## 2019-01-10 DIAGNOSIS — I10 Essential (primary) hypertension: Secondary | ICD-10-CM | POA: Diagnosis not present

## 2019-01-14 ENCOUNTER — Other Ambulatory Visit: Payer: Self-pay | Admitting: Rheumatology

## 2019-01-14 DIAGNOSIS — R1030 Lower abdominal pain, unspecified: Secondary | ICD-10-CM | POA: Diagnosis not present

## 2019-01-14 DIAGNOSIS — C50919 Malignant neoplasm of unspecified site of unspecified female breast: Secondary | ICD-10-CM | POA: Diagnosis not present

## 2019-01-14 DIAGNOSIS — H409 Unspecified glaucoma: Secondary | ICD-10-CM | POA: Diagnosis not present

## 2019-01-14 DIAGNOSIS — Z0000 Encounter for general adult medical examination without abnormal findings: Secondary | ICD-10-CM | POA: Diagnosis not present

## 2019-01-14 DIAGNOSIS — F325 Major depressive disorder, single episode, in full remission: Secondary | ICD-10-CM | POA: Diagnosis not present

## 2019-01-14 DIAGNOSIS — M1990 Unspecified osteoarthritis, unspecified site: Secondary | ICD-10-CM | POA: Diagnosis not present

## 2019-01-14 DIAGNOSIS — J479 Bronchiectasis, uncomplicated: Secondary | ICD-10-CM | POA: Diagnosis not present

## 2019-01-14 DIAGNOSIS — R4189 Other symptoms and signs involving cognitive functions and awareness: Secondary | ICD-10-CM | POA: Diagnosis not present

## 2019-01-14 DIAGNOSIS — M0609 Rheumatoid arthritis without rheumatoid factor, multiple sites: Secondary | ICD-10-CM

## 2019-01-14 DIAGNOSIS — R140 Abdominal distension (gaseous): Secondary | ICD-10-CM | POA: Diagnosis not present

## 2019-01-14 DIAGNOSIS — Z1331 Encounter for screening for depression: Secondary | ICD-10-CM | POA: Diagnosis not present

## 2019-01-14 DIAGNOSIS — I700 Atherosclerosis of aorta: Secondary | ICD-10-CM | POA: Diagnosis not present

## 2019-01-14 DIAGNOSIS — Z1339 Encounter for screening examination for other mental health and behavioral disorders: Secondary | ICD-10-CM | POA: Diagnosis not present

## 2019-01-14 DIAGNOSIS — D899 Disorder involving the immune mechanism, unspecified: Secondary | ICD-10-CM | POA: Diagnosis not present

## 2019-01-14 DIAGNOSIS — M0600 Rheumatoid arthritis without rheumatoid factor, unspecified site: Secondary | ICD-10-CM | POA: Diagnosis not present

## 2019-01-14 NOTE — Telephone Encounter (Signed)
Last Visit: 12/27/18 Next Visit: 07/23/19 Labs: 12/27/18 WNL PLQ Eye Exam: 02/08/18 WNL  Okay to refill per Dr. Estanislado Pandy

## 2019-01-15 ENCOUNTER — Telehealth: Payer: Self-pay | Admitting: Gastroenterology

## 2019-01-15 NOTE — Telephone Encounter (Signed)
Covid-19 screening questions   Do you now or have you had a fever in the last 14 days? No  Do you have any respiratory symptoms of shortness of breath or cough now or in the last 14 days? No  Do you have any family members or close contacts with diagnosed or suspected Covid-19 in the past 14 days? No  Have you been tested for Covid-19 and found to be positive? No        

## 2019-01-16 ENCOUNTER — Ambulatory Visit (INDEPENDENT_AMBULATORY_CARE_PROVIDER_SITE_OTHER): Payer: Medicare Other | Admitting: Gastroenterology

## 2019-01-16 ENCOUNTER — Encounter: Payer: Self-pay | Admitting: Gastroenterology

## 2019-01-16 VITALS — BP 134/60 | HR 68 | Temp 98.4°F | Ht 66.0 in | Wt 134.5 lb

## 2019-01-16 DIAGNOSIS — Z800 Family history of malignant neoplasm of digestive organs: Secondary | ICD-10-CM | POA: Diagnosis not present

## 2019-01-16 DIAGNOSIS — K580 Irritable bowel syndrome with diarrhea: Secondary | ICD-10-CM

## 2019-01-16 DIAGNOSIS — R1084 Generalized abdominal pain: Secondary | ICD-10-CM | POA: Diagnosis not present

## 2019-01-16 MED ORDER — DICYCLOMINE HCL 10 MG PO CAPS: 10.0000 mg | ORAL_CAPSULE | Freq: Three times a day (TID) | ORAL | 11 refills | Status: DC

## 2019-01-16 NOTE — Progress Notes (Signed)
+-   History of Present Illness: This is an 85 year old female referred by Dawn Baton, MD for the evaluation of mild abdominal pain, gas, abdominal gurgling noises, intermittent diarrhea. She has long history of IBS, gas, severe diverticulosis and possible SIBO. Treated with Xifaxan in 04/2018 and metronidazole in 10/2018 by Dr. Virgina Jock without improvement.  She states she generally has 3 bowel movements per day and has had a few occasions where she has had urgent diarrhea.  She has a history of incomplete colonoscopies with last colonoscopy in November 2002 complete to the transverse colon.  She underwent virtual colonoscopy in March 2013 showing severe pandiverticulosis which led to suboptimal colon distention however no lesions were noted.  Denies weight loss, constipation, change in stool caliber, melena, hematochezia, nausea, vomiting, dysphagia, reflux symptoms, chest pain.     Allergies  Allergen Reactions  . Amoxicillin Hives  . Latex Itching   Outpatient Medications Prior to Visit  Medication Sig Dispense Refill  . ALPHAGAN P 0.1 % SOLN PLACE 1 DROP INTO RIGHT EYE TWICE A DAY  12  . Cholecalciferol 1000 units TBDP Take 2,000 Units by mouth at bedtime.     . dorzolamide-timolol (COSOPT) 22.3-6.8 MG/ML ophthalmic solution PLACE 1 DROP INTO BOTH EYES TWICE A DAY  12  . escitalopram (LEXAPRO) 20 MG tablet Take 10 mg by mouth daily.   12  . hydroxychloroquine (PLAQUENIL) 200 MG tablet TAKE 1 TABLET TWICE A DAY ON MONDAY THROUGH FRIDAY 120 tablet 0  . LUMIGAN 0.01 % SOLN INSTILL 1 DROP INTO BOTH EYES ONCE DAILY IN THE EVENING.  4  . dorzolamide (TRUSOPT) 2 % ophthalmic solution Place 1 drop into the right eye 2 (two) times daily.    Marland Kitchen latanoprost (XALATAN) 0.005 % ophthalmic solution Place 1 drop into both eyes at bedtime.      No facility-administered medications prior to visit.    Past Medical History:  Diagnosis Date  . Anxiety disorder   . Bacterial overgrowth syndrome   . Breast  cancer, stage 1 [facility])    '97/recurrent '05 right breast(s/p mastectomy)  . Bunion   . Colon polyps   . Depression   . Diverticulitis   . GERD (gastroesophageal reflux disease)   . Glaucoma   . Hiatal hernia   . History of TIAs    - 13 yrs ago"brief memory loss"  . IBS (irritable bowel syndrome)   . Insomnia   . Irritable bowel syndrome   . Osteoporosis   . Ovarian cyst    Past Surgical History:  Procedure Laterality Date  . ABDOMINAL HYSTERECTOMY    . ANTERIOR APPROACH HEMI HIP ARTHROPLASTY Right 03/22/2017   Procedure: ANTERIOR APPROACH HEMI HIP ARTHROPLASTY;  Surgeon: Rod Can, MD;  Location: [facility];  Service: Orthopedics;  Laterality: Right;  . BREAST BIOPSY     right  . BUNIONECTOMY Bilateral    both, repeat x1  . CATARACT EXTRACTION Left   . HIP ARTHROPLASTY    . KNEE ARTHROPLASTY    . MASTECTOMY  1997   Right  . TONSILLECTOMY    . TOTAL KNEE ARTHROPLASTY Right 04/15/2013   Procedure: RIGHT TOTAL KNEE ARTHROPLASTY;  Surgeon: Gearlean Alf, MD;  Location: [facility];  Service: Orthopedics;  Laterality: Right;   Social History   Socioeconomic History  . Marital status: Widowed    Spouse name: Not on file  . Number of children: 3  . Years of education: Not on file  . Highest education level:  Not on file  Occupational History  . Occupation: Retired    Fish farm manager: RETIRED  Social Needs  . Financial resource strain: Not on file  . Food insecurity    Worry: Not on file    Inability: Not on file  . Transportation needs    Medical: Not on file    Non-medical: Not on file  Tobacco Use  . Smoking status: Former Smoker    Years: 3.00    Types: Cigarettes    Quit date: 04/11/1951    Years since quitting: 67.8  . Smokeless tobacco: Never Used  Substance and Sexual Activity  . Alcohol use: Yes    Comment: occ  . Drug use: No  . Sexual activity: Not Currently  Lifestyle  . Physical activity    Days per week: Not on file    Minutes per session: Not on file   . Stress: Not on file  Relationships  . Social Herbalist on phone: Not on file    Gets together: Not on file    Attends religious service: Not on file    Active member of club or organization: Not on file    Attends meetings of clubs or organizations: Not on file    Relationship status: Not on file  Other Topics Concern  . Not on file  Social History Narrative  . Not on file   Family History  Problem Relation Age of Onset  . Colon cancer Sister 68       Died at 64  . Kidney cancer Father 82  . Pancreatic cancer Mother 23  . Pancreatic cancer Maternal Grandmother 67  . Cancer Paternal Grandfather        died from "abdominal ca" at young age  . Breast cancer Neg Hx         Review of Systems: Pertinent positive and negative review of systems were noted in the above HPI section. All other review of systems were otherwise negative.    Physical Exam: General: Well developed, well nourished, elderly, no acute distress Head: Normocephalic and atraumatic Eyes:  sclerae anicteric, EOMI Ears: Normal auditory acuity Mouth: No deformity or lesions Neck: Supple, no masses or thyromegaly Lungs: Clear throughout to auscultation Heart: Regular rate and rhythm; no murmurs, rubs or bruits Abdomen: Soft, non tender and non distended. No masses, hepatosplenomegaly or hernias noted. Normal Bowel sounds Rectal: Not done Musculoskeletal: Symmetrical with no gross deformities  Skin: No lesions on visible extremities Pulses:  Normal pulses noted Extremities: No clubbing, cyanosis, edema or deformities noted Neurological: Alert oriented x 4, grossly nonfocal Cervical Nodes:  No significant cervical adenopathy Inguinal Nodes: No significant inguinal adenopathy Psychological:  Alert and cooperative. Normal mood and affect   Assessment and Recommendations:  1. IBS, gas, intermittent diarrhea. Severe diverticulosis. Possible SIBO however symptoms did not respond to 2 recent  courses of antibiotics.  Dicyclomine 10 mg qid AC and at bedtime.  Gas-X 4 times daily as needed.  Low gas diet.  Avoid foods that exacerbate symptoms.  Trial of Align, Florastor and [employer] colon health each for 2 weeks and if one provides benefit she can remain on it. REV in about 6 weeks.  2.  Family history of colon cancer, sister at age 73.  Higher risk screening evaluations discontinued due to age.  cc: Dawn Baton, MD [address] [place],  [place] [address]

## 2019-01-16 NOTE — Patient Instructions (Signed)
We have sent the following medications to your pharmacy for you to pick up at your convenience: dicyclomine.   Start a over the counter probiotic such as Electronics engineer, Publishing copy or [employer] colon health x 2 weeks at a time.   Please follow up with Dr. Fuller Plan in 6 weeks but we do not have a schedule out at this time. We will contact you in a few weeks to make that appointment.   Thank you for choosing me and [facility].  Pricilla Riffle. Dagoberto Ligas., MD., Marval Regal

## 2019-01-22 DIAGNOSIS — M1712 Unilateral primary osteoarthritis, left knee: Secondary | ICD-10-CM | POA: Diagnosis not present

## 2019-01-25 ENCOUNTER — Telehealth: Payer: Self-pay | Admitting: Gastroenterology

## 2019-01-25 NOTE — Telephone Encounter (Signed)
Patient informed she can take Align in the morning and dicyclomine 4 times a day. Also it is fine to take them both together. Patient verbalized understanding.

## 2019-01-28 ENCOUNTER — Ambulatory Visit: Payer: Medicare Other

## 2019-01-31 ENCOUNTER — Telehealth: Payer: Self-pay

## 2019-01-31 NOTE — Telephone Encounter (Signed)
Error

## 2019-01-31 NOTE — Telephone Encounter (Signed)
-----   Message from Marzella Schlein, [place] sent at 01/16/2019 10:11 AM EDT ----- Scheduled 6 wk follow when September schedule becomes available.

## 2019-01-31 NOTE — Telephone Encounter (Signed)
Patient states she was sorry and just had a bad day yesterday. I asked patient if there is something we can do or if she is unhappy with our clinic. Patient states no and would like to schedule follow up visit. Patient scheduled for 03/04/19.

## 2019-01-31 NOTE — Telephone Encounter (Signed)
Called patient to schedule 6 week follow up visit. Patient refused appt and states she no longer is coming to our clinic.

## 2019-02-11 DIAGNOSIS — Z23 Encounter for immunization: Secondary | ICD-10-CM | POA: Diagnosis not present

## 2019-03-04 ENCOUNTER — Ambulatory Visit (INDEPENDENT_AMBULATORY_CARE_PROVIDER_SITE_OTHER): Payer: Medicare Other | Admitting: Gastroenterology

## 2019-03-04 ENCOUNTER — Encounter: Payer: Self-pay | Admitting: Gastroenterology

## 2019-03-04 ENCOUNTER — Other Ambulatory Visit: Payer: Self-pay

## 2019-03-04 VITALS — BP 158/62 | HR 64 | Temp 97.9°F | Ht 66.0 in | Wt 135.2 lb

## 2019-03-04 DIAGNOSIS — K580 Irritable bowel syndrome with diarrhea: Secondary | ICD-10-CM

## 2019-03-04 NOTE — Progress Notes (Signed)
    History of Present Illness: This is an 85 year old female with IBS-D returning for follow up.  Since beginning Align and dicyclomine her symptoms have come under very good control.  She now has 2 formed bowel movements per day.  She is very pleased with her current symptom control.  Current Medications, Allergies, Past Medical History, Past Surgical History, Family History and Social History were reviewed in [employer] record.   Physical Exam: General: Well developed, well nourished, no acute distress Head: Normocephalic and atraumatic Eyes:  sclerae anicteric, EOMI Ears: Normal auditory acuity Mouth: No deformity or lesions Lungs: Clear throughout to auscultation Heart: Regular rate and rhythm; no murmurs, rubs or bruits Abdomen: Soft, non tender and non distended. No masses, hepatosplenomegaly or hernias noted. Normal Bowel sounds Rectal: Not done Musculoskeletal: Symmetrical with no gross deformities  Pulses:  Normal pulses noted Extremities: No clubbing, cyanosis, edema or deformities noted Neurological: Alert oriented x 4, grossly nonfocal Psychological:  Alert and cooperative. Normal mood and affect   Assessment and Recommendations:  1. IBS-D.  Continue Align daily and dicyclomine 4 times daily, before meals and at bedtime.  Avoid foods that trigger symptoms.  REV in 1 year.  2.  Family history of colon cancer, sister at age 31.  High risk screening evaluations discontinued due to age.

## 2019-03-04 NOTE — Patient Instructions (Signed)
Stay on Align and dicyclomine.   Thank you for choosing me and [facility].  Pricilla Riffle. Dagoberto Ligas., MD., Marval Regal

## 2019-03-05 ENCOUNTER — Ambulatory Visit
Admission: RE | Admit: 2019-03-05 | Discharge: 2019-03-05 | Disposition: A | Discharge status: Home / Self Care | Payer: Medicare Other | Source: Ambulatory Visit | Attending: Internal Medicine | Admitting: Internal Medicine

## 2019-03-05 ENCOUNTER — Other Ambulatory Visit: Payer: Self-pay

## 2019-03-05 DIAGNOSIS — Z1231 Encounter for screening mammogram for malignant neoplasm of breast: Secondary | ICD-10-CM | POA: Diagnosis not present

## 2019-03-06 DIAGNOSIS — L821 Other seborrheic keratosis: Secondary | ICD-10-CM | POA: Diagnosis not present

## 2019-03-06 DIAGNOSIS — L308 Other specified dermatitis: Secondary | ICD-10-CM | POA: Diagnosis not present

## 2019-03-06 DIAGNOSIS — L300 Nummular dermatitis: Secondary | ICD-10-CM | POA: Diagnosis not present

## 2019-03-06 DIAGNOSIS — L570 Actinic keratosis: Secondary | ICD-10-CM | POA: Diagnosis not present

## 2019-03-13 IMAGING — CR DG FEMUR 1V*R*
2 series · 2 of 2 positions shown · non-contrast
Comparison: None.

CLINICAL DATA: Fall today with right hip pain.

EXAM:
RIGHT FEMUR 1 VIEW

[x femur proximal ap right]
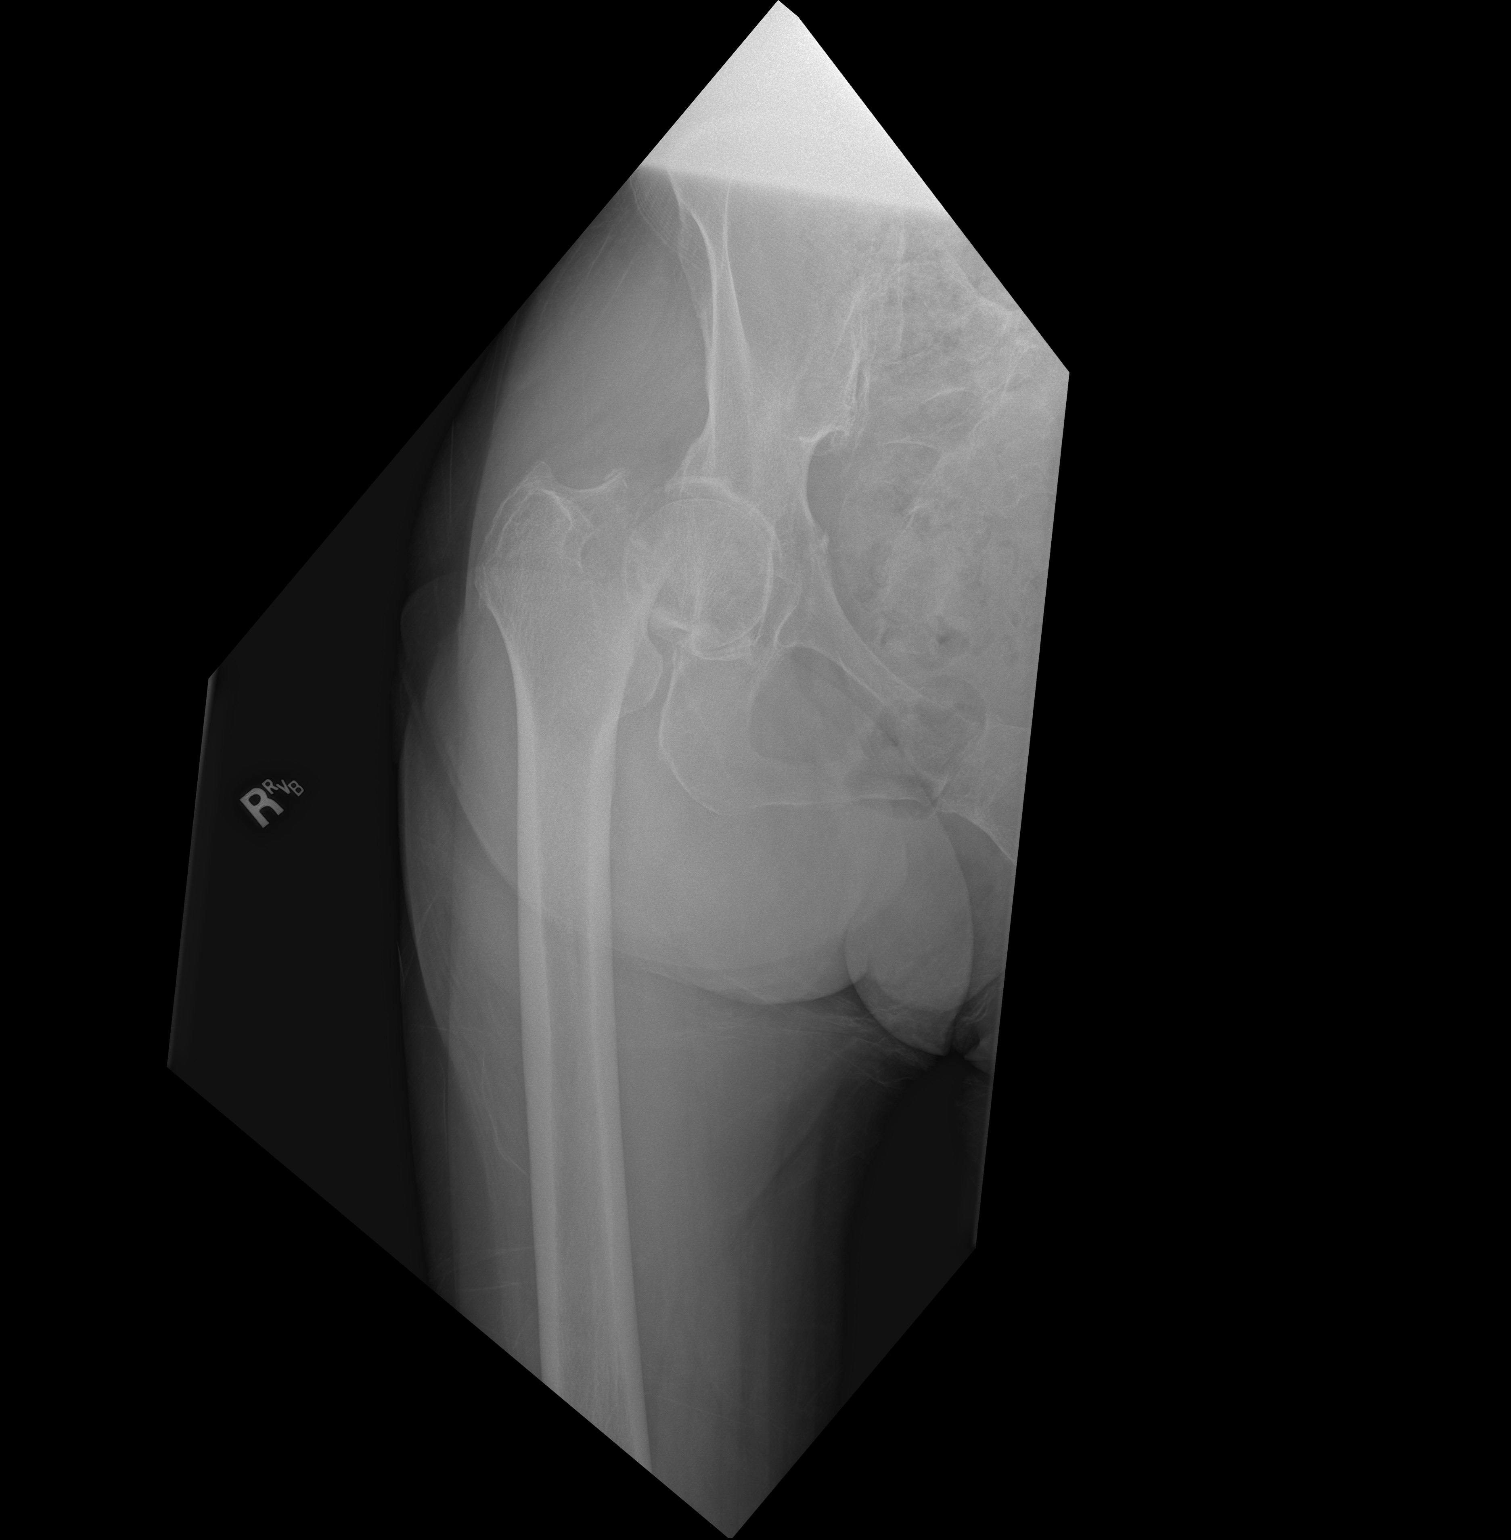

[x femur distal ap right]
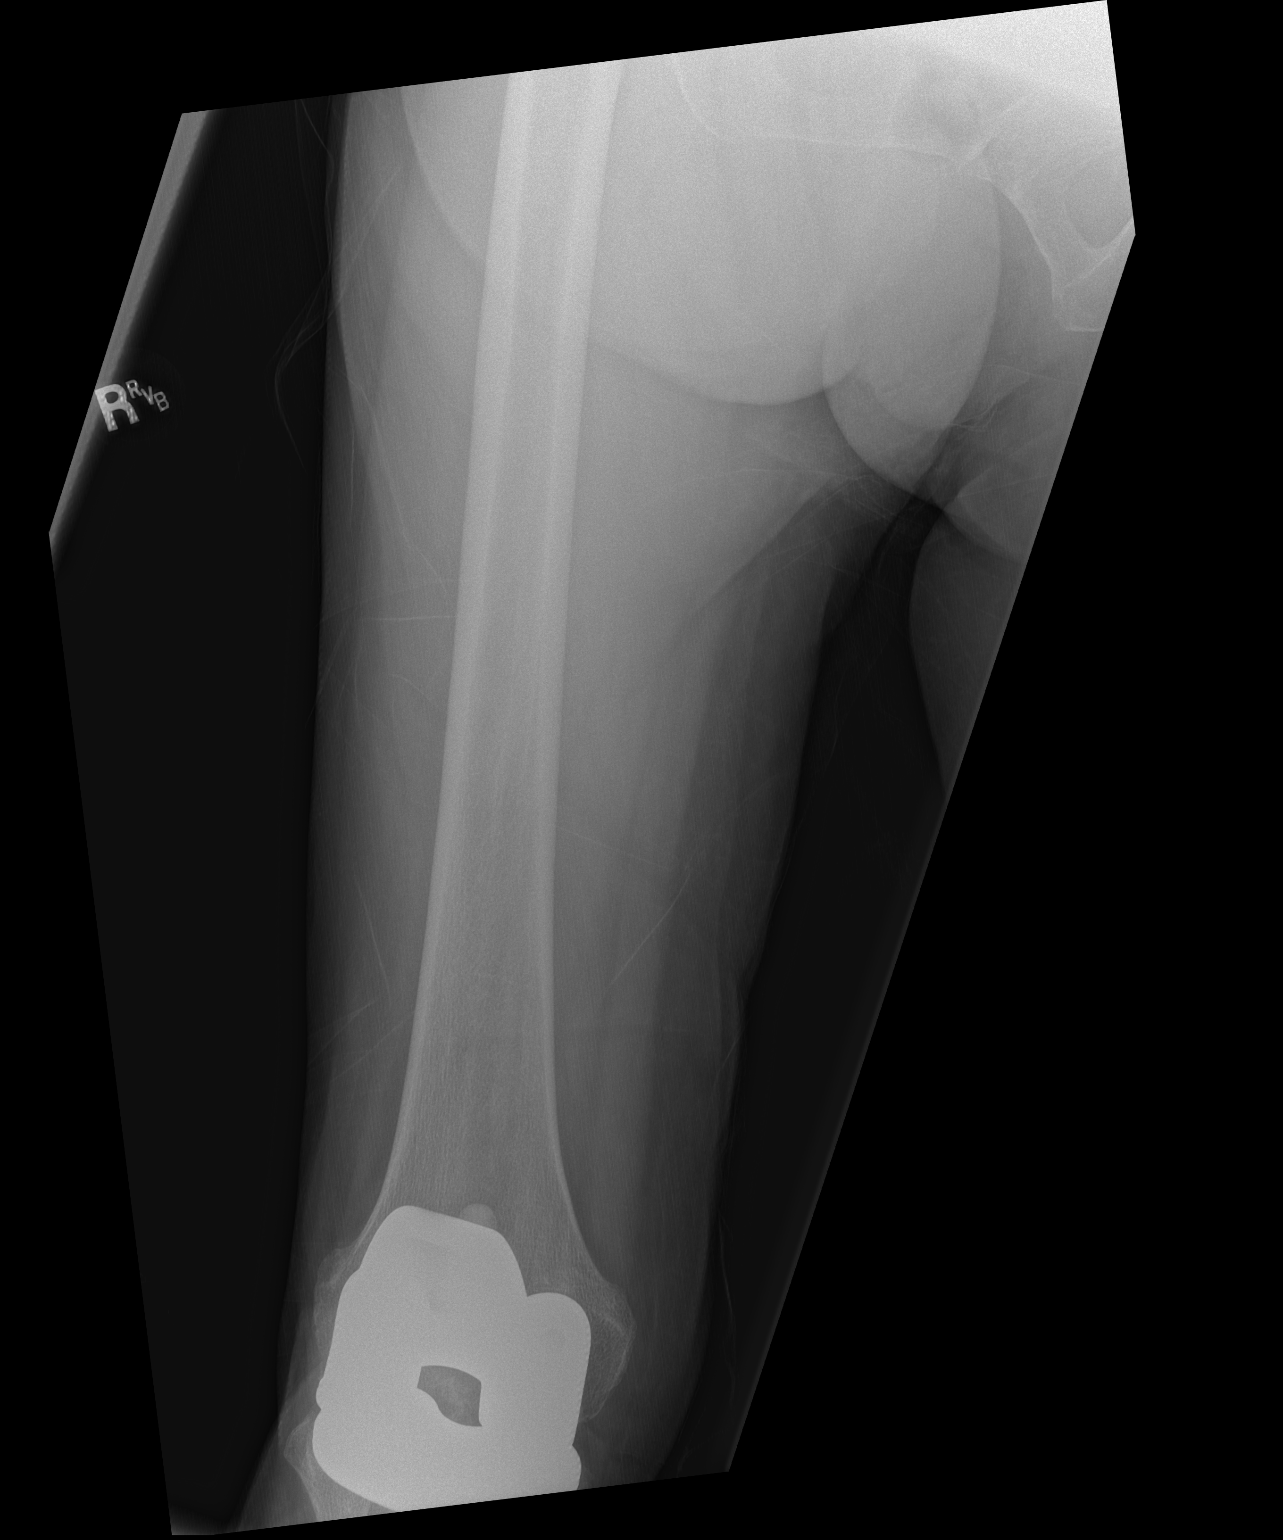

[2 of 2 positions shown; findings below may reference images not displayed]

FINDINGS: Examination demonstrates diffuse osteopenia. There mild degenerative
changes of the right hip. There is a moderately displaced
subcapital/transcervical fracture of the right femoral neck. Minimal
focal irregularity over the mid acetabulum along the pelvic rim as
would be difficult to exclude a fracture in this location as this
was not appreciated on patient's right hip films. Evidence of
previous right knee replacement with hardware intact.
IMPRESSION: Displaced right femoral neck fracture. Cannot completely exclude a
subtle fracture of the right mid acetabulum.

## 2019-03-13 IMAGING — CT CT HIP*R* W/O CM
2 of 3 series · 17 of 46 positions shown, 19 images · non-contrast
Comparison: Plain films earlier today.  CT 09/05/2011

CLINICAL DATA: Fall onto right hip while walking with displaced
fracture on x-rays. Possible acetabular fracture.

EXAM:
CT OF THE RIGHT HIP WITHOUT CONTRAST
TECHNIQUE: Multidetector CT imaging of the right hip was performed according to
the standard protocol. Multiplanar CT image reconstructions were
also generated.

[Series 3: pelvis st · axial · 0.39mm/px · z∈[-253,-97]mm · 14 of 90 slices shown, 16 images]
[im 6/90  soft-tissue]
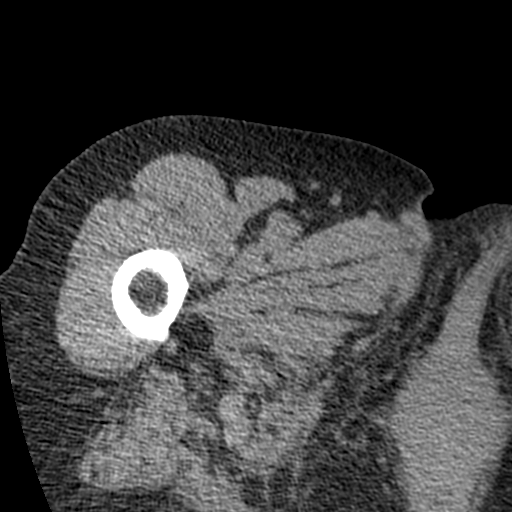
[im 6/90  bone]
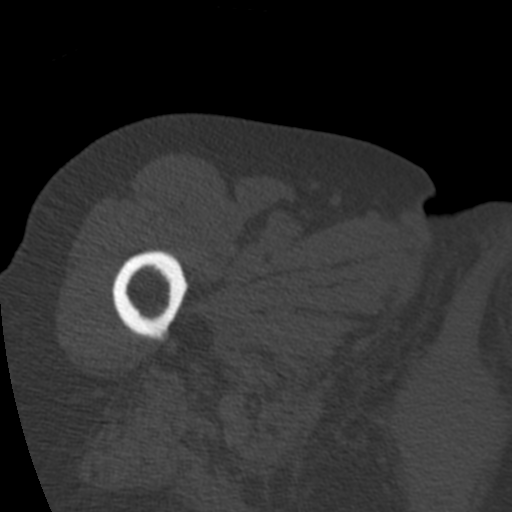
[im 12/90  soft-tissue]
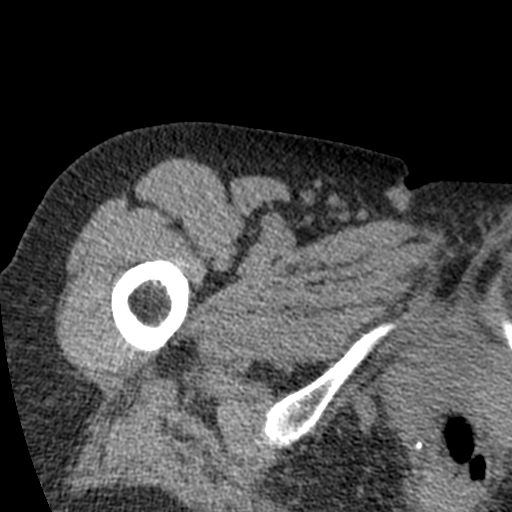
[im 18/90  soft-tissue]
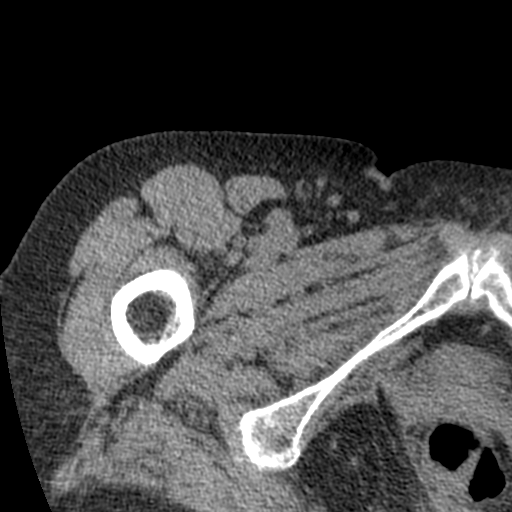
[im 23/90  soft-tissue]
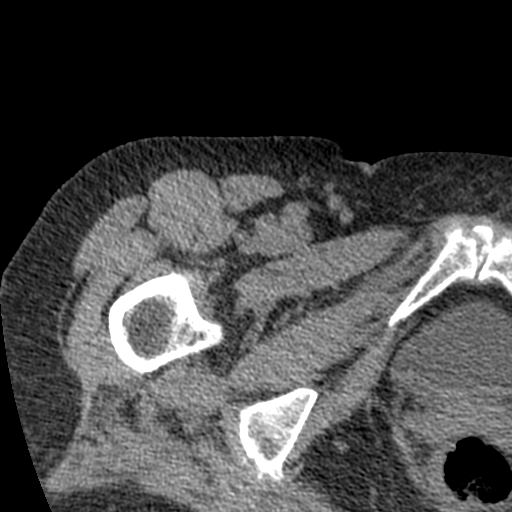
[im 29/90  soft-tissue]
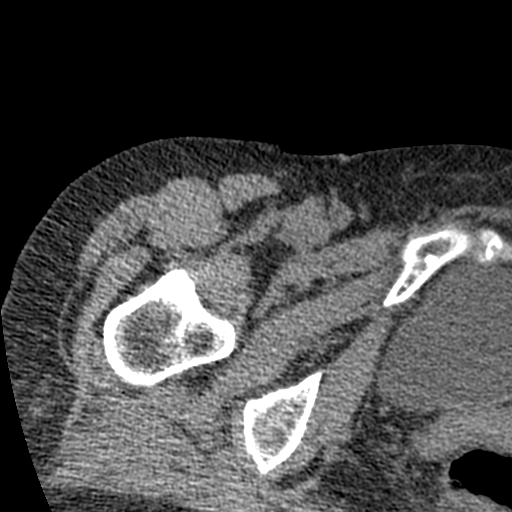
[im 35/90  soft-tissue]
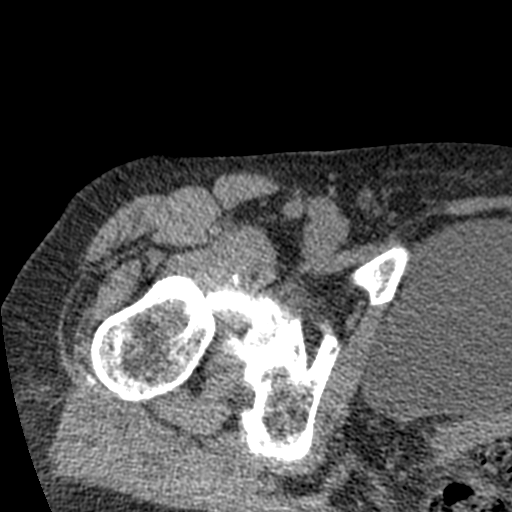
[im 41/90  soft-tissue]
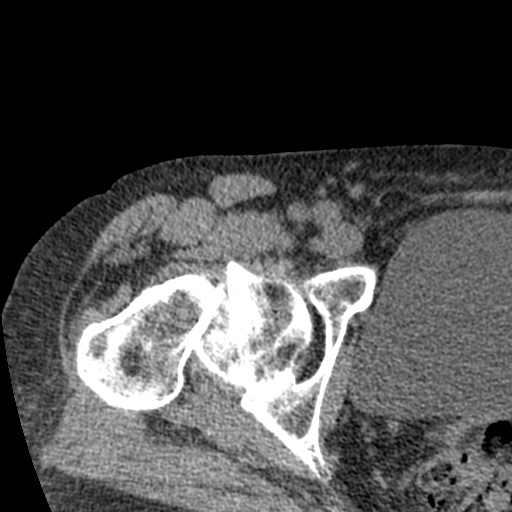
[im 49/90  soft-tissue]
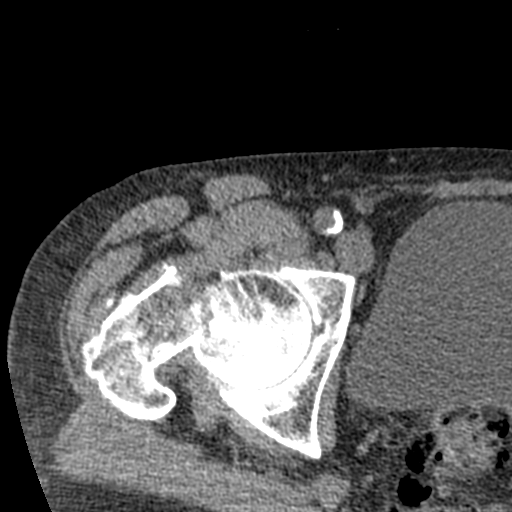
[im 55/90  soft-tissue]
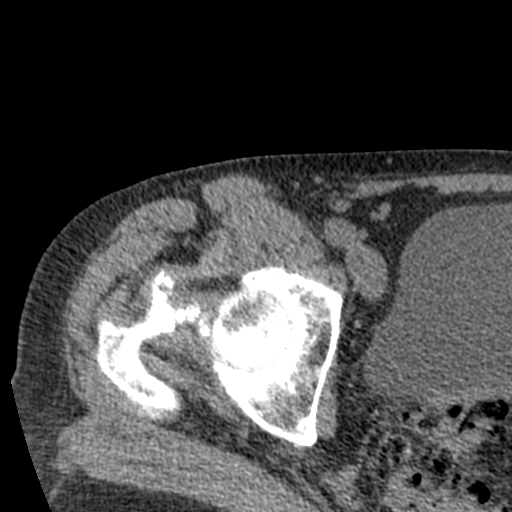
[im 55/90  bone]
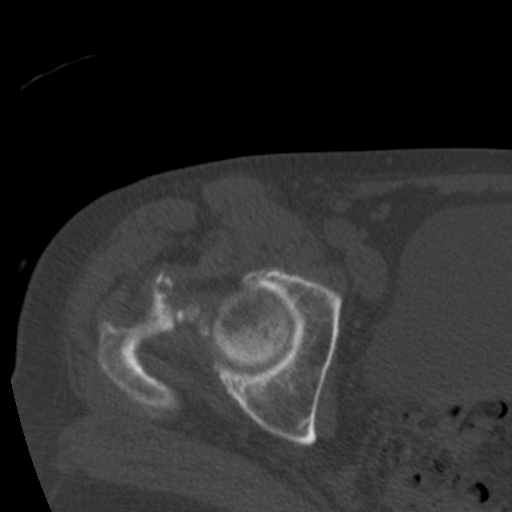
[im 61/90  soft-tissue]
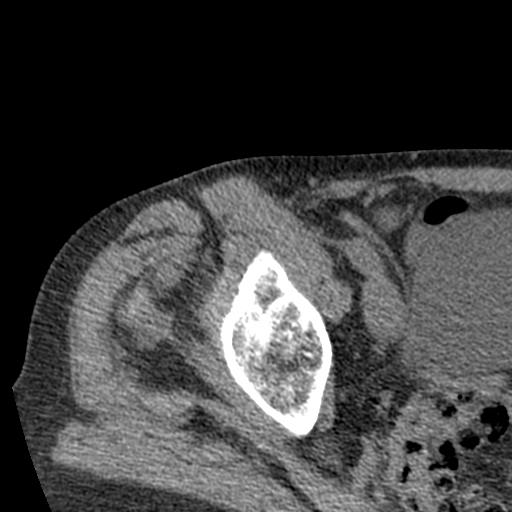
[im 67/90  soft-tissue]
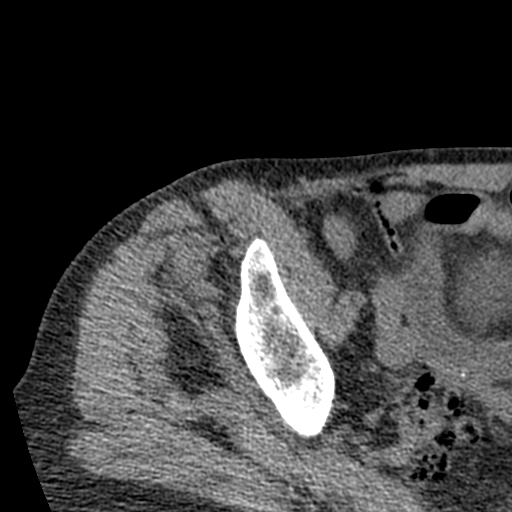
[im 72/90  soft-tissue]
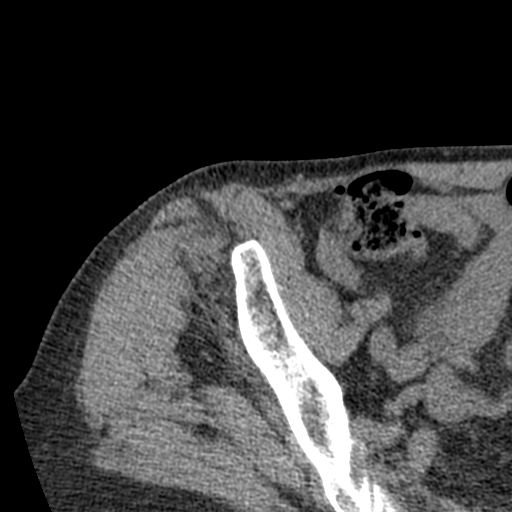
[im 78/90  soft-tissue]
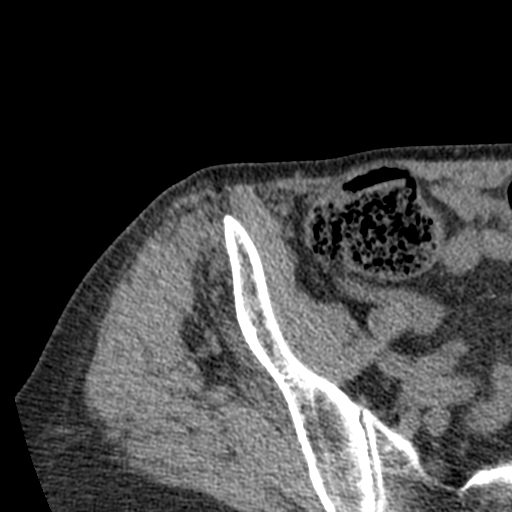
[im 84/90  soft-tissue]
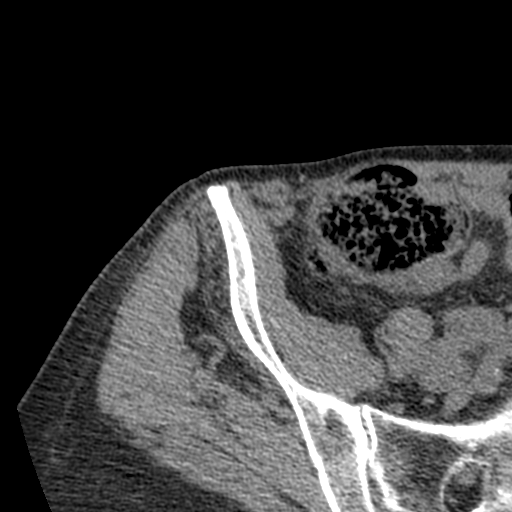

[Series 4: coronal images · coronal · 0.36mm/px · 3 of 93 slices shown]
[im 31/93  soft-tissue]
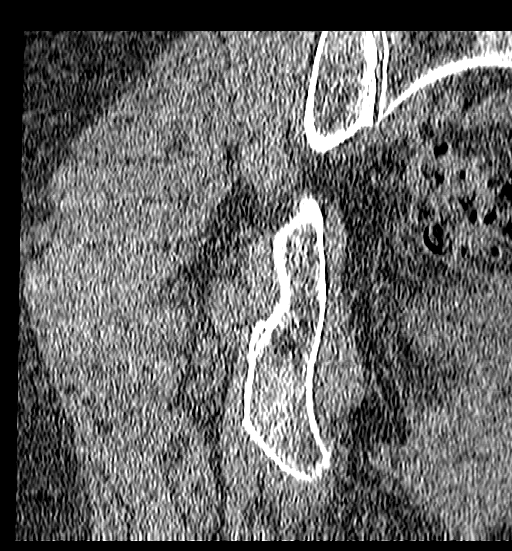
[im 41/93  soft-tissue]
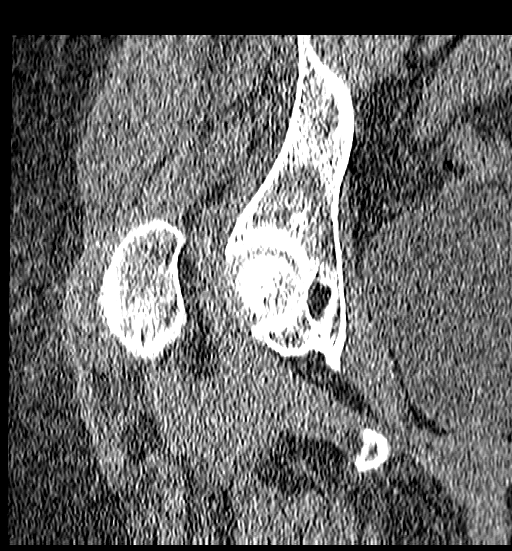
[im 52/93  soft-tissue]
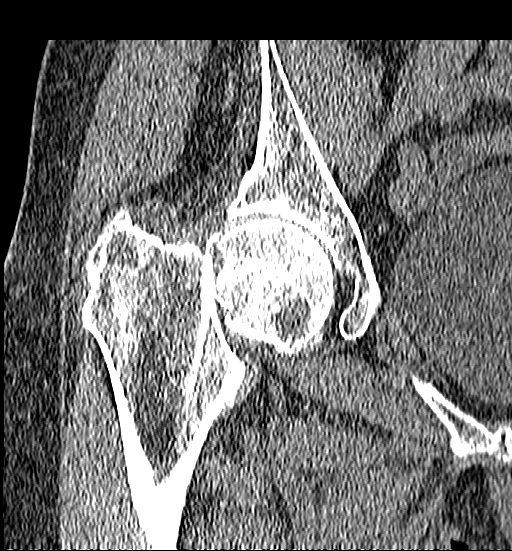

[17 of 46 positions shown; findings below may reference images not displayed]

FINDINGS: Bones/Joint/Cartilage

Examination demonstrates evidence of patient's displaced subcapital
right femoral neck fracture. There is no evidence of a mid
acetabular fracture. No other fractures identified. Mild
degenerative change of the right hip.

Ligaments

Suboptimally assessed by CT.

Muscles and Tendons

Unremarkable.

Soft tissues

Mild calcified plaque over they right external iliac artery.
Diverticulosis of the colon.
IMPRESSION: Displaced subcapital fracture of the right femoral neck. No
acetabular fracture.

## 2019-03-21 DIAGNOSIS — H01001 Unspecified blepharitis right upper eyelid: Secondary | ICD-10-CM | POA: Diagnosis not present

## 2019-03-21 DIAGNOSIS — H401113 Primary open-angle glaucoma, right eye, severe stage: Secondary | ICD-10-CM | POA: Diagnosis not present

## 2019-03-21 DIAGNOSIS — H401122 Primary open-angle glaucoma, left eye, moderate stage: Secondary | ICD-10-CM | POA: Diagnosis not present

## 2019-03-21 DIAGNOSIS — Z961 Presence of intraocular lens: Secondary | ICD-10-CM | POA: Diagnosis not present

## 2019-03-23 ENCOUNTER — Other Ambulatory Visit: Payer: Self-pay

## 2019-03-23 ENCOUNTER — Encounter (HOSPITAL_COMMUNITY): Payer: Self-pay | Admitting: Emergency Medicine

## 2019-03-23 ENCOUNTER — Emergency Department (HOSPITAL_COMMUNITY): Payer: Medicare Other

## 2019-03-23 ENCOUNTER — Emergency Department (HOSPITAL_COMMUNITY)
Admission: EM | Admit: 2019-03-23 | Discharge: 2019-03-23 | Disposition: A | Discharge status: Home / Self Care | Payer: Medicare Other | Attending: Emergency Medicine | Admitting: Emergency Medicine

## 2019-03-23 DIAGNOSIS — W19XXXA Unspecified fall, initial encounter: Secondary | ICD-10-CM | POA: Diagnosis not present

## 2019-03-23 DIAGNOSIS — Y929 Unspecified place or not applicable: Secondary | ICD-10-CM | POA: Diagnosis not present

## 2019-03-23 DIAGNOSIS — Z87891 Personal history of nicotine dependence: Secondary | ICD-10-CM | POA: Insufficient documentation

## 2019-03-23 DIAGNOSIS — Y999 Unspecified external cause status: Secondary | ICD-10-CM | POA: Insufficient documentation

## 2019-03-23 DIAGNOSIS — I959 Hypotension, unspecified: Secondary | ICD-10-CM | POA: Diagnosis not present

## 2019-03-23 DIAGNOSIS — M069 Rheumatoid arthritis, unspecified: Secondary | ICD-10-CM | POA: Insufficient documentation

## 2019-03-23 DIAGNOSIS — R0902 Hypoxemia: Secondary | ICD-10-CM | POA: Diagnosis not present

## 2019-03-23 DIAGNOSIS — M810 Age-related osteoporosis without current pathological fracture: Secondary | ICD-10-CM | POA: Diagnosis not present

## 2019-03-23 DIAGNOSIS — Y9389 Activity, other specified: Secondary | ICD-10-CM | POA: Diagnosis not present

## 2019-03-23 DIAGNOSIS — S098XXA Other specified injuries of head, initial encounter: Secondary | ICD-10-CM | POA: Insufficient documentation

## 2019-03-23 DIAGNOSIS — R42 Dizziness and giddiness: Secondary | ICD-10-CM | POA: Diagnosis not present

## 2019-03-23 DIAGNOSIS — S0990XA Unspecified injury of head, initial encounter: Secondary | ICD-10-CM

## 2019-03-23 DIAGNOSIS — S199XXA Unspecified injury of neck, initial encounter: Secondary | ICD-10-CM | POA: Diagnosis not present

## 2019-03-23 DIAGNOSIS — Z79899 Other long term (current) drug therapy: Secondary | ICD-10-CM | POA: Insufficient documentation

## 2019-03-23 MED ORDER — ACETAMINOPHEN 325 MG PO TABS
650.0000 mg | ORAL_TABLET | Freq: Once | ORAL | Status: AC
  Administered 2019-03-23: 18:00:00 650 mg via ORAL
  Filled 2019-03-23: qty 2

## 2019-03-23 NOTE — Discharge Instructions (Addendum)
Your CT head/Neck were normal today.  You may alternate tylenol or ibuprofen for your pain.  Please follow up with your Primary care physician as needed.

## 2019-03-23 NOTE — ED Notes (Signed)
Daughter is driving pt back to her facility, pt states no pain and ready to discharge.

## 2019-03-23 NOTE — ED Provider Notes (Signed)
[facility] DEPT Provider Note   CSN: [id number] Arrival date & time: 03/23/19  1647     History   Chief Complaint Chief Complaint  Patient presents with   Fall    HPI Dawn Diaz is a 85 y.o. female.     85 y.o female with a PMH GERD, Anxiety presents to the ED via Ems s/p fall. Patient reports she was gardening this evening stating she wanted to switch her plans going from short to long branches, reports she was picking up these flowerpots and was almost finished on the job when she suddenly became too fatigued and suddenly fell back hitting her head and neck on the median.  Patient reports she was unable to stand after this occurred, she reports she is unable to stand after her falls these days as "I am just almost 85 years old ".  She denies any LOC, is currently not on any blood thinners.  Denies any pain to her chest, lower back, abdomen. Denies any other trauma.   The history is provided by the patient and medical records.  Fall Pertinent negatives include no chest pain, no abdominal pain, no headaches and no shortness of breath.    Past Medical History:  Diagnosis Date   Anxiety disorder    Bacterial overgrowth syndrome    Breast cancer, stage 1 ([facility])    '97/recurrent '05 right breast(s/p mastectomy)   Bunion    Colon polyps    Depression    Diverticulitis    GERD (gastroesophageal reflux disease)    Glaucoma    Hiatal hernia    History of TIAs    - 13 yrs ago"brief memory loss"   IBS (irritable bowel syndrome)    Insomnia    Irritable bowel syndrome    Osteoporosis    Ovarian cyst     Patient Active Problem List   Diagnosis Date Noted   Depression, major, single episode, moderate ([facility]) 03/28/2017   Pulmonary nodule seen on imaging study 03/28/2017   Acquired leg length discrepancy 03/28/2017   Closed displaced fracture of right femoral neck ([facility]) 03/22/2017   Breast cancer, stage 1 ([facility])     Rheumatoid arthritis, seronegative, multiple sites ([facility]) 11/15/2016   Osteopenia 11/15/2016   Primary osteoarthritis of both hands 11/15/2016   High risk medications (not anticoagulants) long-term use 11/15/2016   Family history of malignant neoplasm of gastrointestinal tract 12/25/2013   Constipation 04/24/2013   Glaucoma 04/24/2013   OA (osteoarthritis) of knee 04/15/2013   History of breast cancer in female 12/20/2010   DIVERTICULOSIS-COLON 08/06/2009   IRRITABLE BOWEL SYNDROME 08/06/2009    Past Surgical History:  Procedure Laterality Date   ABDOMINAL HYSTERECTOMY     ANTERIOR APPROACH HEMI HIP ARTHROPLASTY Right 03/22/2017   Procedure: ANTERIOR APPROACH HEMI HIP ARTHROPLASTY;  Surgeon: Rod Can, MD;  Location: [facility];  Service: Orthopedics;  Laterality: Right;   BREAST BIOPSY     right   BUNIONECTOMY Bilateral    both, repeat x1   CATARACT EXTRACTION Left    KNEE ARTHROPLASTY     MASTECTOMY  1997   Right   TONSILLECTOMY     TOTAL KNEE ARTHROPLASTY Right 04/15/2013   Procedure: RIGHT TOTAL KNEE ARTHROPLASTY;  Surgeon: Gearlean Alf, MD;  Location: [facility];  Service: Orthopedics;  Laterality: Right;     OB History   No obstetric history on file.      Home Medications    Prior to Admission medications  Medication Sig Start Date End Date Taking? Authorizing Provider  ALPHAGAN P 0.1 % SOLN Apply 1 drop to eye See admin instructions. Into right eye twice daily. 03/21/17  Yes [provider]  amLODipine (NORVASC) 2.5 MG tablet Take 1 tablet by mouth daily. 02/17/19  Yes [provider]  dorzolamide-timolol (COSOPT) 22.3-6.8 MG/ML ophthalmic solution Place 1 drop into both eyes 2 (two) times daily.  09/06/16  Yes [provider]  escitalopram (LEXAPRO) 10 MG tablet Take 10 mg by mouth daily. 02/13/19  Yes [provider]  LUMIGAN 0.01 % SOLN Place 1 drop into both eyes at bedtime.  08/19/17  Yes [provider]   Probiotic Product (ALIGN) 4 MG CAPS Take 1 capsule by mouth daily.   Yes [provider]  dicyclomine (BENTYL) 10 MG capsule Take 1 capsule (10 mg total) by mouth 4 (four) times daily -  before meals and at bedtime. Patient not taking: Reported on 03/23/2019 01/16/19   Ladene Artist, MD  hydroxychloroquine (PLAQUENIL) 200 MG tablet TAKE 1 TABLET TWICE A DAY ON [facility] Patient not taking: Reported on 03/23/2019 01/14/19   Bo Merino, MD    Family History Family History  Problem Relation Age of Onset   Colon cancer Sister 61       Died at 39   Kidney cancer Father 57   Pancreatic cancer Mother 62   Pancreatic cancer Maternal Grandmother 36   Cancer Paternal Grandfather        died from "abdominal ca" at young age   Breast cancer Neg Hx     Social History Social History   Tobacco Use   Smoking status: Former Smoker    Years: 3.00    Types: Cigarettes    Quit date: 04/11/1951    Years since quitting: 67.9   Smokeless tobacco: Never Used  Substance Use Topics   Alcohol use: Yes    Comment: occ   Drug use: No     Allergies   Amoxicillin and Latex   Review of Systems Review of Systems  Constitutional: Negative for fever.  Respiratory: Negative for shortness of breath.   Cardiovascular: Negative for chest pain.  Gastrointestinal: Negative for abdominal pain, nausea and vomiting.  Musculoskeletal: Negative for neck pain.  Skin: Negative for pallor and wound.  Neurological: Negative for headaches.     Physical Exam Updated Vital Signs BP (!) 160/149 (BP Location: Right Arm)    Pulse 66    Temp 98.1 F (36.7 C) (Oral)    Resp 19    SpO2 97%   Physical Exam Vitals signs and nursing note reviewed.  Constitutional:      Appearance: Normal appearance.  HENT:     Head: Normocephalic.     Mouth/Throat:     Mouth: Mucous membranes are moist.  Eyes:     Pupils: Pupils are equal, round, and reactive to light.  Neck:      Musculoskeletal: Normal range of motion and neck supple.     Comments: No midline tenderness.  Cardiovascular:     Rate and Rhythm: Normal rate.  Pulmonary:     Effort: Pulmonary effort is normal.     Breath sounds: Normal breath sounds. No rales.  Abdominal:     General: Abdomen is flat.  Neurological:     Mental Status: She is alert and oriented to person, place, and time.     Comments: Alert, oriented, thought content appropriate. Speech fluent without evidence of aphasia. Able  to follow 2 step commands without difficulty.  Cranial Nerves:  II:  Peripheral visual fields grossly normal, pupils, round, reactive to light III,IV, VI: ptosis not present, extra-ocular motions intact bilaterally  V,VII: smile symmetric, facial light touch sensation equal VIII: hearing grossly normal bilaterally  IX,X: midline uvula rise  XI: bilateral shoulder shrug equal and strong XII: midline tongue extension  Motor:  5/5 in upper and lower extremities bilaterally including strong and equal grip strength and dorsiflexion/plantar flexion Sensory: light touch normal in all extremities.  Cerebellar: normal finger-to-nose with bilateral upper extremities, pronator drift negative Gait: normal gait and balance       ED Treatments / Results  Labs (all labs ordered are listed, but only abnormal results are displayed) Labs Reviewed - No data to display  EKG None  Radiology Ct Head Wo Contrast  Result Date: 03/23/2019 CLINICAL DATA:  85 year old female with history of trauma from a fall with injury to the posterior aspect of the head on concrete. EXAM: CT HEAD WITHOUT CONTRAST CT CERVICAL SPINE WITHOUT CONTRAST TECHNIQUE: Multidetector CT imaging of the head and cervical spine was performed following the standard protocol without intravenous contrast. Multiplanar CT image reconstructions of the cervical spine were also generated. COMPARISON:  Head CT and cervical spine CT 09/03/2017. FINDINGS: CT HEAD  FINDINGS Brain: Mild cerebral atrophy. Patchy and confluent areas of decreased attenuation are noted throughout the deep and periventricular white matter of the cerebral hemispheres bilaterally, compatible with chronic microvascular ischemic disease. No evidence of acute infarction, hemorrhage, hydrocephalus, extra-axial collection or mass lesion/mass effect. Vascular: No hyperdense vessel or unexpected calcification. Skull: Normal. Negative for fracture or focal lesion. Sinuses/Orbits: No acute finding. Other: None. CT CERVICAL SPINE FINDINGS Alignment: Reversal of normal cervical lordosis centered at the level of C3-C4, likely chronic and degenerative in nature. 3 mm of anterolisthesis of C7 upon T1. Skull base and vertebrae: No acute fracture. No primary bone lesion or focal pathologic process. Soft tissues and spinal canal: No prevertebral fluid or swelling. No visible canal hematoma. Disc levels: Multilevel degenerative disc disease, most pronounced at C3-C4, C4-C5, C5-C6 and C6-C7. Moderate multilevel facet arthropathy. Upper chest: Mild pleuroparenchymal thickening and architectural distortion in the apices of the thorax bilaterally, presumably chronic post infectious or inflammatory scarring. Other: None. IMPRESSION: 1. No evidence of significant acute traumatic injury to the skull, brain or cervical spine. 2. Mild cerebral atrophy with chronic microvascular ischemic changes in the cerebral white matter, as above. 3. Multilevel degenerative disc disease and cervical spondylosis, as above. Electronically Signed   By: Vinnie Langton M.D.   On: 03/23/2019 21:34   Ct Cervical Spine Wo Contrast  Result Date: 03/23/2019 CLINICAL DATA:  85 year old female with history of trauma from a fall with injury to the posterior aspect of the head on concrete. EXAM: CT HEAD WITHOUT CONTRAST CT CERVICAL SPINE WITHOUT CONTRAST TECHNIQUE: Multidetector CT imaging of the head and cervical spine was performed following the  standard protocol without intravenous contrast. Multiplanar CT image reconstructions of the cervical spine were also generated. COMPARISON:  Head CT and cervical spine CT 09/03/2017. FINDINGS: CT HEAD FINDINGS Brain: Mild cerebral atrophy. Patchy and confluent areas of decreased attenuation are noted throughout the deep and periventricular white matter of the cerebral hemispheres bilaterally, compatible with chronic microvascular ischemic disease. No evidence of acute infarction, hemorrhage, hydrocephalus, extra-axial collection or mass lesion/mass effect. Vascular: No hyperdense vessel or unexpected calcification. Skull: Normal. Negative for fracture or focal lesion. Sinuses/Orbits: No acute finding. Other: None.  CT CERVICAL SPINE FINDINGS Alignment: Reversal of normal cervical lordosis centered at the level of C3-C4, likely chronic and degenerative in nature. 3 mm of anterolisthesis of C7 upon T1. Skull base and vertebrae: No acute fracture. No primary bone lesion or focal pathologic process. Soft tissues and spinal canal: No prevertebral fluid or swelling. No visible canal hematoma. Disc levels: Multilevel degenerative disc disease, most pronounced at C3-C4, C4-C5, C5-C6 and C6-C7. Moderate multilevel facet arthropathy. Upper chest: Mild pleuroparenchymal thickening and architectural distortion in the apices of the thorax bilaterally, presumably chronic post infectious or inflammatory scarring. Other: None. IMPRESSION: 1. No evidence of significant acute traumatic injury to the skull, brain or cervical spine. 2. Mild cerebral atrophy with chronic microvascular ischemic changes in the cerebral white matter, as above. 3. Multilevel degenerative disc disease and cervical spondylosis, as above. Electronically Signed   By: Vinnie Langton M.D.   On: 03/23/2019 21:34    Procedures Procedures (including critical care time)  Medications Ordered in ED Medications  acetaminophen (TYLENOL) tablet 650 mg (650 mg  Oral Given 03/23/19 1812)     Initial Impression / Assessment and Plan / ED Course  I have reviewed the triage vital signs and the nursing notes.  Pertinent labs & imaging results that were available during my care of the patient were reviewed by me and considered in my medical decision making (see chart for details).       Patient with extensive past medical history presents the ED via EMS status post mechanical fall.  Patient was currently living in the pending assisted facility, reports she was trying to garden her plants, along with make some changes when she suddenly fell onto her head and neck which hit the concrete.  According to her chart which have extensively review patient is currently not on any blood thinners.  She denies any headache, loss of consciousness, dizziness, chest pain or shortness of breath.  Patient is neurological exam is reassuring, she has been ambulatory in the ED with a steady gait, was provided with Tylenol to help with her pain.  Due to mechanism of injury along with higher risk for any acute process a CT head and a CT head neck were obtained.  CT head and CT neck showed: 1. No evidence of significant acute traumatic injury to the skull,  brain or cervical spine.  2. Mild cerebral atrophy with chronic microvascular ischemic changes  in the cerebral white matter, as above.  3. Multilevel degenerative disc disease and cervical spondylosis, as  above.     These results were discussed at length with patient.  She may alternate any ibuprofen or Tylenol to help with her pain.  Daughter at the bedside understand and agrees with management.  Patient stable for discharge.  I have discussed case with Dr. Theora Gianotti who has seen patient and agrees with plan and management.    Portions of this note were generated with [employer]. Dictation errors may occur despite best attempts at proofreading. Final Clinical Impressions(s) / ED Diagnoses   Final  diagnoses:  Fall, initial encounter  Injury of head, initial encounter    ED Discharge Orders    None       Janeece Fitting, PA-C 03/23/19 2200    Blanchie Dessert, MD 03/23/19 2302

## 2019-03-23 NOTE — ED Triage Notes (Signed)
Pt from [place] was outside gardening and had a fall of unknown cause. Pt fell and hit posterior of head on concrete. Per EMS no exterior signs of injury or LOC reported however pt has been dizzy post fall. A&Ox4.

## 2019-04-08 ENCOUNTER — Other Ambulatory Visit: Payer: Self-pay | Admitting: Rheumatology

## 2019-04-08 DIAGNOSIS — M0609 Rheumatoid arthritis without rheumatoid factor, multiple sites: Secondary | ICD-10-CM

## 2019-04-08 NOTE — Telephone Encounter (Addendum)
Last Visit: 12/27/18 Next Visit: 07/23/19 Labs: 12/27/18 WNL PLQ Eye Exam: 02/08/18 WNL  Attempted to contact the patient and left message for patient to advise we need update PLQ eye exam.  Okay to refill 30 day supply per Dr. Estanislado Pandy

## 2019-04-15 NOTE — Progress Notes (Signed)
Virtual Visit via Telephone Note  I connected with Dawn Diaz on 04/16/19 at  2:45 PM EDT by telephone and verified that I am speaking with the correct person using two identifiers.  Location: Patient: Home  Provider: Clinic    This service was conducted via virtual visit.  The patient was located at home. I was located in my office.  Consent was obtained prior to the virtual visit and is aware of possible charges through their insurance for this visit.  The patient is an established patient.  Dr. Estanislado Pandy, MD conducted the virtual visit and Hazel Sams, PA-C acted as scribe during the service.  Office staff helped with scheduling follow up visits after the service was conducted.   I discussed the limitations, risks, security and privacy concerns of performing an evaluation and management service by telephone and the availability of in person appointments. I also discussed with the patient that there may be a patient responsible charge related to this service. The patient expressed understanding and agreed to proceed.  CC: Discuss starting on Fosamax  History of Present Illness: Patient is 85 year old female with a past medical history of seronegative rheumatoid arthritis, osteoarthritis, and osteopenia.  She is taking Plaquenil as prescribed. She denies any recent rheumatoid arthritis flares. She states she has intermittent discomfort in the right knee replacement.  She is considering starting on treatment for osteoporosis.  She is apprehensive of side effects. She is considering starting on fosamax 70 mg once weekly. She is taking a vitamin D supplement daily. She has not had any recent falls or fractures.   Review of Systems  Constitutional: Negative for fever and malaise/fatigue.  Eyes: Negative for photophobia, pain, discharge and redness.  Respiratory: Negative for cough, shortness of breath and wheezing.   Cardiovascular: Negative for chest pain and palpitations.  Gastrointestinal:  Negative for blood in stool, constipation and diarrhea.  Genitourinary: Negative for dysuria.  Musculoskeletal: Negative for back pain, joint pain, myalgias and neck pain.  Skin: Negative for rash.  Neurological: Negative for dizziness and headaches.  Psychiatric/Behavioral: Negative for depression. The patient is not nervous/anxious and does not have insomnia.       Observations/Objective: Physical Exam  Constitutional: She is oriented to person, place, and time.  Neurological: She is alert and oriented to person, place, and time.  Psychiatric: Mood, memory, affect and judgment normal.   Patient reports morning stiffness for 0 minutes.   Patient denies nocturnal pain.  Difficulty dressing/grooming: Denies Difficulty climbing stairs: Reports Difficulty getting out of chair: Reports Difficulty using hands for taps, buttons, cutlery, and/or writing: Denies    Assessment and Plan: Visit Diagnoses: Rheumatoid arthritis of multiple sites with negative rheumatoid factor ([facility]) - She has not had any recent rheumatoid arthritis flares. She has no joint pain, joint swelling, or morning stiffness.  She is clinically doing well on Plaquenil 200 mg 1 tablet by mouth daily. She was advised to notify us if she develops increased joint pain or joint swelling.  She will follow up in 5 months.   High risk medication use -Plaquenil 200 mg 1 tablet by mouth daily. Last Plaquenil eye exam normal on 02/08/2018.   According to the patient she has had an updated eye exam recently. CBC and CMP WNL on 12/27/18. Standing orders placed.    Plan: CBC with Differential/Platelet, COMPLETE METABOLIC PANEL WITH GFR,   Primary osteoarthritis of both hands - She has PIP and DIP synovial thickening consistent with osteoarthritis of both hands.  Bilateral CMC joint synovial thickening.  No synovitis or tenderness on exam.  Joint protection and muscle strengthening were discussed.   History of total right hip replacement:  Doing well. She has no discomfort at this time.   History of total right knee replacement -She experiences intermittent discomfort in the right knee joint replacement.  She has some difficulty climbing steps and getting up from a chair.   DDD (degenerative disc disease), cervical - She has no discomfort at this time.   DDD (degenerative disc disease), lumbar- She has no discomfort at this time.   Osteopenia of multiple sites - History of right hip fracture and replacement.  She was treated with Arimidex and tamoxifen for breast cancer in the past. No DEXA on file.  Recommendation from [facility] for Prolia/Forteo in 2018 after fracture.  Dr. Virgina Jock has prescribed Fosamax 70 mg 1 tablet by mouth once weekly, but she has not started taking it.  She was encouraged to take fosamax as prescribed.  She will continue taking vitamin D as well.   History of vitamin D deficiency - She continues to take a vitamin D supplement.   Other medical conditions are listed as follows:   History of scoliosis   History of diverticulosis  History of breast cancer   History of IBS   History of thyroiditis      Follow Up Instructions: She will follow up in 5 months.    I discussed the assessment and treatment plan with the patient. The patient was provided an opportunity to ask questions and all were answered. The patient agreed with the plan and demonstrated an understanding of the instructions.   The patient was advised to call back or seek an in-person evaluation if the symptoms worsen or if the condition fails to improve as anticipated.  I provided 15 minutes of non-face-to-face time during this encounter.   Bo Merino, MD   Scribed by -  Hazel Sams PA-C

## 2019-04-16 ENCOUNTER — Telehealth: Payer: Self-pay | Admitting: Rheumatology

## 2019-04-16 ENCOUNTER — Telehealth (INDEPENDENT_AMBULATORY_CARE_PROVIDER_SITE_OTHER): Payer: Medicare Other | Admitting: Rheumatology

## 2019-04-16 ENCOUNTER — Encounter: Payer: Self-pay | Admitting: Rheumatology

## 2019-04-16 ENCOUNTER — Other Ambulatory Visit: Payer: Self-pay

## 2019-04-16 DIAGNOSIS — M51369 Other intervertebral disc degeneration, lumbar region without mention of lumbar back pain or lower extremity pain: Secondary | ICD-10-CM

## 2019-04-16 DIAGNOSIS — M8589 Other specified disorders of bone density and structure, multiple sites: Secondary | ICD-10-CM | POA: Diagnosis not present

## 2019-04-16 DIAGNOSIS — Z96641 Presence of right artificial hip joint: Secondary | ICD-10-CM

## 2019-04-16 DIAGNOSIS — Z8719 Personal history of other diseases of the digestive system: Secondary | ICD-10-CM | POA: Diagnosis not present

## 2019-04-16 DIAGNOSIS — M19042 Primary osteoarthritis, left hand: Secondary | ICD-10-CM

## 2019-04-16 DIAGNOSIS — M5136 Other intervertebral disc degeneration, lumbar region: Secondary | ICD-10-CM | POA: Diagnosis not present

## 2019-04-16 DIAGNOSIS — Z8639 Personal history of other endocrine, nutritional and metabolic disease: Secondary | ICD-10-CM | POA: Diagnosis not present

## 2019-04-16 DIAGNOSIS — Z96651 Presence of right artificial knee joint: Secondary | ICD-10-CM

## 2019-04-16 DIAGNOSIS — M0609 Rheumatoid arthritis without rheumatoid factor, multiple sites: Secondary | ICD-10-CM | POA: Diagnosis not present

## 2019-04-16 DIAGNOSIS — M19041 Primary osteoarthritis, right hand: Secondary | ICD-10-CM

## 2019-04-16 DIAGNOSIS — Z79899 Other long term (current) drug therapy: Secondary | ICD-10-CM | POA: Diagnosis not present

## 2019-04-16 DIAGNOSIS — Z8739 Personal history of other diseases of the musculoskeletal system and connective tissue: Secondary | ICD-10-CM | POA: Diagnosis not present

## 2019-04-16 DIAGNOSIS — M5030 Other cervical disc degeneration, unspecified cervical region: Secondary | ICD-10-CM | POA: Diagnosis not present

## 2019-04-16 DIAGNOSIS — Z853 Personal history of malignant neoplasm of breast: Secondary | ICD-10-CM | POA: Diagnosis not present

## 2019-04-16 NOTE — Telephone Encounter (Signed)
Patient states she has been taking Vitamin D3 and Plaquenil for years.  Patient states she is thinking of adding Olendronate Sodium 70 mg.  Patient states the directions are to take 1 pill by mouth 1 time/week with 8 ounces of water and to stay up for at least 30 minutes.  Patient is asking if Dr. Estanislado Pandy thinks it will beneficial.

## 2019-04-22 ENCOUNTER — Telehealth: Payer: Self-pay | Admitting: Gastroenterology

## 2019-04-22 NOTE — Telephone Encounter (Signed)
ERROR

## 2019-05-02 ENCOUNTER — Ambulatory Visit (INDEPENDENT_AMBULATORY_CARE_PROVIDER_SITE_OTHER): Payer: Medicare Other | Admitting: Nurse Practitioner

## 2019-05-02 ENCOUNTER — Encounter: Payer: Self-pay | Admitting: Nurse Practitioner

## 2019-05-02 VITALS — BP 110/56 | HR 64 | Temp 97.6°F | Ht 66.0 in | Wt 136.0 lb

## 2019-05-02 DIAGNOSIS — K580 Irritable bowel syndrome with diarrhea: Secondary | ICD-10-CM | POA: Diagnosis not present

## 2019-05-02 NOTE — Patient Instructions (Signed)
If you are age 83 or older, your body mass index should be between 23-30. Your Body mass index is 21.95 kg/m. If this is out of the aforementioned range listed, please consider follow up with your Primary Care Provider.  If you are age 52 or younger, your body mass index should be between 19-25. Your Body mass index is 21.95 kg/m. If this is out of the aformentioned range listed, please consider follow up with your Primary Care Provider.   Continue Align.  Imodium 2 mg - can take 1-2 times daily.  Can try to omit bedtime dose of Bentyl.  Follow up as needed.  Thank you for choosing me and [facility].   Tye Savoy, NP

## 2019-05-02 NOTE — Progress Notes (Signed)
Chief Complaint:    diarrhea  IMPRESSION and PLAN:     85 year old female with IBS-D under fairly good control with Align and Bentyl.  Answered her questions about dicyclomine  -Can take Loperamide 2 mg 1-2 times daily if needed for refractory symptoms  -Continue Align -Continue Dicyclomine TID before meals. She will try omitting bedtime dose. No side effects such as dry eyes or dry mouth.   HPI:     Patient is an 85 year old female with PMH significant for RA, breast cancer, diverticulitis, IBS, and GERD.  She is known to Dr. Fuller Plan, last seen 03/04/2019 .  At the time of last visit her symptoms were under good control with Align and dicyclomine. Comes in today for evaluation of diarrhea and to discuss medications.  The majority of time her bowel movements are solid and no more than 2-3 times a day.  Diarrhea can occur after eating what she considers to be out of the ordinary. Patient wants to discuss Dicyclomine, unclear  Whether to take it in the am or not since she takes Align in the mornings. She wants to know what Dicyclomine is / how does it work? Just started Fosamax, we already knew to stay upright for a few hours after taking it.   Data Reviewed:  12/27/18 CMET normal CBC normal  Review of systems:     No chest pain, no SOB, no fevers, no urinary sx   Past Medical History:  Diagnosis Date  . Anxiety disorder   . Bacterial overgrowth syndrome   . Breast cancer, stage 1 [facility])    '97/recurrent '05 right breast(s/p mastectomy)  . Bunion   . Colon polyps   . Depression   . Diverticulitis   . GERD (gastroesophageal reflux disease)   . Glaucoma   . Hiatal hernia   . History of TIAs    - 13 yrs ago"brief memory loss"  . IBS (irritable bowel syndrome)   . Insomnia   . Irritable bowel syndrome   . Osteoporosis   . Ovarian cyst     Patient's surgical history, family medical history, social history, medications and allergies were all reviewed in Epic     Current  Outpatient Medications  Medication Sig Dispense Refill  . ALPHAGAN P 0.1 % SOLN Apply 1 drop to eye See admin instructions. Into right eye twice daily.  12  . amLODipine (NORVASC) 2.5 MG tablet Take 1 tablet by mouth daily.    . dorzolamide-timolol (COSOPT) 22.3-6.8 MG/ML ophthalmic solution Place 1 drop into both eyes 2 (two) times daily.   12  . escitalopram (LEXAPRO) 10 MG tablet Take 10 mg by mouth daily.    . hydroxychloroquine (PLAQUENIL) 200 MG tablet TAKE 1 TABLET TWICE A DAY ON MONDAY THROUGH FRIDAY (RHEUMATOID ARTHRITIS) 40 tablet 0  . LUMIGAN 0.01 % SOLN Place 1 drop into both eyes at bedtime.   4  . Probiotic Product (ALIGN) 4 MG CAPS Take 1 capsule by mouth daily.     No current facility-administered medications for this visit.     Physical Exam:     BP (!) 110/56   Pulse 64   Temp 97.6 F (36.4 C)   Ht 5\' 6"  (1.676 m)   Wt 136 lb (61.7 kg)   BMI 21.95 kg/m   GENERAL:  Pleasant female in NAD PSYCH: : Cooperative, normal affect EENT:  conjunctiva pink, mucous membranes moist, neck supple without masses CARDIAC:  RRR, no murmur heard, no peripheral  edema PULM: Normal respiratory effort, lungs CTA bilaterally, no wheezing ABDOMEN:  Nondistended, soft, nontender. No obvious masses, no hepatomegaly,  normal bowel sounds SKIN:  turgor, no lesions seen Musculoskeletal:  Normal muscle tone, normal strength NEURO: Alert and oriented x 3, no focal neurologic deficits   Tye Savoy , NP 05/02/2019, 2:12 PM

## 2019-05-03 NOTE — Progress Notes (Signed)
Reviewed and agree with management plan.  Malcolm T. Stark, MD FACG [facility]  

## 2019-05-22 DIAGNOSIS — D1801 Hemangioma of skin and subcutaneous tissue: Secondary | ICD-10-CM | POA: Diagnosis not present

## 2019-05-22 DIAGNOSIS — Z86018 Personal history of other benign neoplasm: Secondary | ICD-10-CM | POA: Diagnosis not present

## 2019-05-22 DIAGNOSIS — D225 Melanocytic nevi of trunk: Secondary | ICD-10-CM | POA: Diagnosis not present

## 2019-05-22 DIAGNOSIS — L821 Other seborrheic keratosis: Secondary | ICD-10-CM | POA: Diagnosis not present

## 2019-05-22 DIAGNOSIS — L570 Actinic keratosis: Secondary | ICD-10-CM | POA: Diagnosis not present

## 2019-05-22 DIAGNOSIS — L814 Other melanin hyperpigmentation: Secondary | ICD-10-CM | POA: Diagnosis not present

## 2019-05-22 DIAGNOSIS — L300 Nummular dermatitis: Secondary | ICD-10-CM | POA: Diagnosis not present

## 2019-05-22 DIAGNOSIS — Z23 Encounter for immunization: Secondary | ICD-10-CM | POA: Diagnosis not present

## 2019-05-22 DIAGNOSIS — Z85828 Personal history of other malignant neoplasm of skin: Secondary | ICD-10-CM | POA: Diagnosis not present

## 2019-06-25 ENCOUNTER — Telehealth: Payer: Self-pay | Admitting: Gastroenterology

## 2019-06-25 NOTE — Telephone Encounter (Signed)
I spoke with the patient's daughter Collie Siad .  I reviewed the dicyclomine and loperamide instructions with her.  She will keep her appt for next week with Dr. Fuller Plan

## 2019-06-28 ENCOUNTER — Telehealth: Payer: Self-pay | Admitting: Nurse Practitioner

## 2019-06-28 NOTE — Telephone Encounter (Signed)
Heather from [facility] is calling asking if they can get the dicyclomine switched to the [facility] in [place].

## 2019-07-01 ENCOUNTER — Other Ambulatory Visit: Payer: Self-pay

## 2019-07-01 MED ORDER — DICYCLOMINE HCL 10 MG PO CAPS: 10.0000 mg | ORAL_CAPSULE | Freq: Three times a day (TID) | ORAL | 0 refills | Status: AC

## 2019-07-03 ENCOUNTER — Ambulatory Visit (INDEPENDENT_AMBULATORY_CARE_PROVIDER_SITE_OTHER): Payer: Medicare Other | Admitting: Gastroenterology

## 2019-07-03 ENCOUNTER — Encounter: Payer: Self-pay | Admitting: Gastroenterology

## 2019-07-03 ENCOUNTER — Other Ambulatory Visit (INDEPENDENT_AMBULATORY_CARE_PROVIDER_SITE_OTHER): Payer: Medicare Other

## 2019-07-03 VITALS — BP 124/70 | HR 64 | Temp 97.6°F | Ht 66.0 in | Wt 133.0 lb

## 2019-07-03 DIAGNOSIS — Z23 Encounter for immunization: Secondary | ICD-10-CM | POA: Diagnosis not present

## 2019-07-03 DIAGNOSIS — K580 Irritable bowel syndrome with diarrhea: Secondary | ICD-10-CM

## 2019-07-03 DIAGNOSIS — R197 Diarrhea, unspecified: Secondary | ICD-10-CM

## 2019-07-03 LAB — CBC WITH DIFFERENTIAL/PLATELET
Basophils Absolute: 0.1 10*3/uL (ref 0.0–0.1)
Basophils Relative: 1.2 % (ref 0.0–3.0)
Eosinophils Absolute: 0.1 10*3/uL (ref 0.0–0.7)
Eosinophils Relative: 1.3 % (ref 0.0–5.0)
HCT: 40.5 % (ref 36.0–46.0)
Hemoglobin: 13.5 g/dL (ref 12.0–15.0)
Lymphocytes Relative: 14.4 % (ref 12.0–46.0)
Lymphs Abs: 1.1 10*3/uL (ref 0.7–4.0)
MCHC: 33.3 g/dL (ref 30.0–36.0)
MCV: 88.5 fl (ref 78.0–100.0)
Monocytes Absolute: 0.6 10*3/uL (ref 0.1–1.0)
Monocytes Relative: 8.6 % (ref 3.0–12.0)
Neutro Abs: 5.5 10*3/uL (ref 1.4–7.7)
Neutrophils Relative %: 74.5 % (ref 43.0–77.0)
Platelets: 313 10*3/uL (ref 150.0–400.0)
RBC: 4.57 Mil/uL (ref 3.87–5.11)
RDW: 13 % (ref 11.5–15.5)
WBC: 7.4 10*3/uL (ref 4.0–10.5)

## 2019-07-03 LAB — COMPREHENSIVE METABOLIC PANEL
ALT: 15 U/L (ref 0–35)
AST: 20 U/L (ref 0–37)
Albumin: 3.6 g/dL (ref 3.5–5.2)
Alkaline Phosphatase: 89 U/L (ref 39–117)
BUN: 17 mg/dL (ref 6–23)
CO2: 28 mEq/L (ref 19–32)
Calcium: 9 mg/dL (ref 8.4–10.5)
Chloride: 104 mEq/L (ref 96–112)
Creatinine, Ser: 0.77 mg/dL (ref 0.40–1.20)
GFR: 70.42 mL/min (ref >60.00)
Glucose, Bld: 109 mg/dL — ABNORMAL HIGH (ref 70–99)
Potassium: 3.9 mEq/L (ref 3.5–5.1)
Sodium: 139 mEq/L (ref 135–145)
Total Bilirubin: 0.4 mg/dL (ref 0.2–1.2)
Total Protein: 6.5 g/dL (ref 6.0–8.3)

## 2019-07-03 LAB — IGA: IgA: 308 mg/dL (ref 68–378)

## 2019-07-03 LAB — TSH: TSH: 1.71 u[IU]/mL (ref 0.35–4.50)

## 2019-07-03 NOTE — Progress Notes (Signed)
    History of Present Illness: This is a 86 year old female complaining of diarrhea for 1 month. She is a resident of [facility]. She is accompanied by her daughter.  She has a history of IBS-D which was previously been well controlled on dicyclomine, Align and Imodium as needed.  She states for the past month her postprandial diarrhea and postprandial abdominal pain have significantly worsened.  She has not been taking dicyclomine as recommended before meals. Her daughter feels that she is forgetting.  She received the first dose of SARS-CoV-2 vaccine yesterday.  She complains of mild abdominal distention for the past couple months.  Her weight is stable and appetite is good.  Current Medications, Allergies, Past Medical History, Past Surgical History, Family History and Social History were reviewed in [employer] record.   Physical Exam: General: Well developed, well nourished, elderly, no acute distress Head: Normocephalic and atraumatic Eyes:  sclerae anicteric, EOMI Ears: Normal auditory acuity Mouth: No deformity or lesions Lungs: Clear throughout to auscultation Heart: Regular rate and rhythm; no murmurs, rubs or bruits Abdomen: Soft, non tender and minimally distended. No masses, hepatosplenomegaly or hernias noted. Normal Bowel sounds Rectal: Not done Musculoskeletal: Symmetrical with no gross deformities  Pulses:  Normal pulses noted Extremities: No clubbing, cyanosis, edema or deformities noted Neurological: Alert oriented x 4, grossly nonfocal Psychological:  Alert and cooperative. Normal mood and affect   Assessment and Recommendations:  1. IBS-D, worsening abdominal pain diarrhea, abdominal distention. R/O infectious diarrhea, pancreatic insufficiency, etc. CBC, CMP, TSH, tTG, fecal elastase, GI pathogen panel, low FODMAP diet, abd [place].  Take dicyclomine 10 mg 30 minutes AC and HS. Imodium 3 times daily as needed. REV in 6 weeks.

## 2019-07-03 NOTE — Patient Instructions (Signed)
Your provider has requested that you go to the basement level for lab work before leaving today. Press "B" on the elevator. The lab is located at the first door on the left as you exit the elevator.  Start taking your dicyclomine before meals and at bedtime.   Take over the counter Imodium twice daily as needed.   You have been given a low fodmap diet to follow.   You have been scheduled for an abdominal ultrasound at [facility] (1st floor of hospital) on 07/09/19 at 9:30am. Please arrive 15 minutes prior to your appointment for registration. Make certain not to have anything to eat or drink 6 hours prior to your appointment. Should you need to reschedule your appointment, please contact radiology at [phone]. This test typically takes about 30 minutes to perform.  Normal BMI (Body Mass Index- based on height and weight) is between 23 and 30. Your BMI today is Body mass index is 21.47 kg/m. Marland Kitchen Please consider follow up  regarding your BMI with your Primary Care Provider.  Thank you for choosing me and [facility].  Pricilla Riffle. Dagoberto Ligas., MD., [facility]

## 2019-07-04 LAB — TISSUE TRANSGLUTAMINASE, IGA: (tTG) Ab, IgA: 1 U/mL

## 2019-07-05 ENCOUNTER — Other Ambulatory Visit: Payer: Medicare Other

## 2019-07-05 DIAGNOSIS — R197 Diarrhea, unspecified: Secondary | ICD-10-CM | POA: Diagnosis not present

## 2019-07-05 DIAGNOSIS — K580 Irritable bowel syndrome with diarrhea: Secondary | ICD-10-CM

## 2019-07-09 ENCOUNTER — Other Ambulatory Visit: Payer: Self-pay

## 2019-07-09 ENCOUNTER — Ambulatory Visit (HOSPITAL_COMMUNITY)
Admission: RE | Admit: 2019-07-09 | Discharge: 2019-07-09 | Disposition: A | Discharge status: Home / Self Care | Payer: Medicare Other | Source: Ambulatory Visit | Attending: Gastroenterology | Admitting: Gastroenterology

## 2019-07-09 DIAGNOSIS — R197 Diarrhea, unspecified: Secondary | ICD-10-CM | POA: Insufficient documentation

## 2019-07-09 DIAGNOSIS — R109 Unspecified abdominal pain: Secondary | ICD-10-CM | POA: Diagnosis not present

## 2019-07-09 DIAGNOSIS — K580 Irritable bowel syndrome with diarrhea: Secondary | ICD-10-CM | POA: Diagnosis not present

## 2019-07-09 NOTE — Addendum Note (Signed)
Addended by: Marzella Schlein on: 07/09/2019 02:02 PM   Modules accepted: Orders

## 2019-07-12 LAB — GASTROINTESTINAL PATHOGEN PANEL PCR
C. difficile Tox A/B, PCR: NOT DETECTED
Campylobacter, PCR: NOT DETECTED
Cryptosporidium, PCR: NOT DETECTED
E coli (ETEC) LT/ST PCR: NOT DETECTED
E coli (STEC) stx1/stx2, PCR: NOT DETECTED
E coli 0157, PCR: NOT DETECTED
Giardia lamblia, PCR: NOT DETECTED
Norovirus, PCR: NOT DETECTED
Rotavirus A, PCR: NOT DETECTED
Salmonella, PCR: NOT DETECTED
Shigella, PCR: NOT DETECTED

## 2019-07-12 LAB — PANCREATIC ELASTASE, FECAL: Pancreatic Elastase-1, Stool: 350 mcg/g

## 2019-07-15 ENCOUNTER — Ambulatory Visit (HOSPITAL_COMMUNITY)
Admission: RE | Admit: 2019-07-15 | Discharge: 2019-07-15 | Disposition: A | Discharge status: Home / Self Care | Payer: Medicare Other | Source: Ambulatory Visit | Attending: Gastroenterology | Admitting: Gastroenterology

## 2019-07-15 ENCOUNTER — Other Ambulatory Visit: Payer: Self-pay

## 2019-07-15 DIAGNOSIS — R197 Diarrhea, unspecified: Secondary | ICD-10-CM | POA: Diagnosis not present

## 2019-07-15 DIAGNOSIS — K580 Irritable bowel syndrome with diarrhea: Secondary | ICD-10-CM | POA: Insufficient documentation

## 2019-07-15 DIAGNOSIS — R109 Unspecified abdominal pain: Secondary | ICD-10-CM | POA: Diagnosis not present

## 2019-07-16 ENCOUNTER — Ambulatory Visit (HOSPITAL_COMMUNITY): Payer: Medicare Other

## 2019-07-17 DIAGNOSIS — F325 Major depressive disorder, single episode, in full remission: Secondary | ICD-10-CM | POA: Diagnosis not present

## 2019-07-17 DIAGNOSIS — Z0289 Encounter for other administrative examinations: Secondary | ICD-10-CM | POA: Diagnosis not present

## 2019-07-17 DIAGNOSIS — M0600 Rheumatoid arthritis without rheumatoid factor, unspecified site: Secondary | ICD-10-CM | POA: Diagnosis not present

## 2019-07-17 DIAGNOSIS — H409 Unspecified glaucoma: Secondary | ICD-10-CM | POA: Diagnosis not present

## 2019-07-17 DIAGNOSIS — N1831 Chronic kidney disease, stage 3a: Secondary | ICD-10-CM | POA: Diagnosis not present

## 2019-07-17 DIAGNOSIS — R4189 Other symptoms and signs involving cognitive functions and awareness: Secondary | ICD-10-CM | POA: Diagnosis not present

## 2019-07-17 DIAGNOSIS — I129 Hypertensive chronic kidney disease with stage 1 through stage 4 chronic kidney disease, or unspecified chronic kidney disease: Secondary | ICD-10-CM | POA: Diagnosis not present

## 2019-07-18 DIAGNOSIS — K589 Irritable bowel syndrome without diarrhea: Secondary | ICD-10-CM | POA: Diagnosis not present

## 2019-07-18 DIAGNOSIS — D899 Disorder involving the immune mechanism, unspecified: Secondary | ICD-10-CM | POA: Diagnosis not present

## 2019-07-18 DIAGNOSIS — C50919 Malignant neoplasm of unspecified site of unspecified female breast: Secondary | ICD-10-CM | POA: Diagnosis not present

## 2019-07-18 DIAGNOSIS — M1990 Unspecified osteoarthritis, unspecified site: Secondary | ICD-10-CM | POA: Diagnosis not present

## 2019-07-18 DIAGNOSIS — I700 Atherosclerosis of aorta: Secondary | ICD-10-CM | POA: Diagnosis not present

## 2019-07-18 DIAGNOSIS — R627 Adult failure to thrive: Secondary | ICD-10-CM | POA: Diagnosis not present

## 2019-07-18 DIAGNOSIS — N1831 Chronic kidney disease, stage 3a: Secondary | ICD-10-CM | POA: Diagnosis not present

## 2019-07-18 DIAGNOSIS — F325 Major depressive disorder, single episode, in full remission: Secondary | ICD-10-CM | POA: Diagnosis not present

## 2019-07-18 DIAGNOSIS — R4181 Age-related cognitive decline: Secondary | ICD-10-CM | POA: Diagnosis not present

## 2019-07-18 DIAGNOSIS — I129 Hypertensive chronic kidney disease with stage 1 through stage 4 chronic kidney disease, or unspecified chronic kidney disease: Secondary | ICD-10-CM | POA: Diagnosis not present

## 2019-07-18 DIAGNOSIS — M0600 Rheumatoid arthritis without rheumatoid factor, unspecified site: Secondary | ICD-10-CM | POA: Diagnosis not present

## 2019-07-18 DIAGNOSIS — H409 Unspecified glaucoma: Secondary | ICD-10-CM | POA: Diagnosis not present

## 2019-07-23 ENCOUNTER — Other Ambulatory Visit: Payer: Self-pay

## 2019-07-23 ENCOUNTER — Ambulatory Visit (INDEPENDENT_AMBULATORY_CARE_PROVIDER_SITE_OTHER): Payer: Medicare Other | Admitting: Rheumatology

## 2019-07-23 ENCOUNTER — Encounter: Payer: Self-pay | Admitting: Physician Assistant

## 2019-07-23 VITALS — BP 133/56 | HR 59 | Resp 12 | Ht 67.0 in | Wt 131.0 lb

## 2019-07-23 DIAGNOSIS — M19041 Primary osteoarthritis, right hand: Secondary | ICD-10-CM

## 2019-07-23 DIAGNOSIS — M5136 Other intervertebral disc degeneration, lumbar region: Secondary | ICD-10-CM | POA: Diagnosis not present

## 2019-07-23 DIAGNOSIS — Z8639 Personal history of other endocrine, nutritional and metabolic disease: Secondary | ICD-10-CM | POA: Diagnosis not present

## 2019-07-23 DIAGNOSIS — Z8739 Personal history of other diseases of the musculoskeletal system and connective tissue: Secondary | ICD-10-CM

## 2019-07-23 DIAGNOSIS — Z853 Personal history of malignant neoplasm of breast: Secondary | ICD-10-CM

## 2019-07-23 DIAGNOSIS — Z79899 Other long term (current) drug therapy: Secondary | ICD-10-CM | POA: Diagnosis not present

## 2019-07-23 DIAGNOSIS — M19042 Primary osteoarthritis, left hand: Secondary | ICD-10-CM

## 2019-07-23 DIAGNOSIS — Z96641 Presence of right artificial hip joint: Secondary | ICD-10-CM

## 2019-07-23 DIAGNOSIS — M5030 Other cervical disc degeneration, unspecified cervical region: Secondary | ICD-10-CM | POA: Diagnosis not present

## 2019-07-23 DIAGNOSIS — M0609 Rheumatoid arthritis without rheumatoid factor, multiple sites: Secondary | ICD-10-CM | POA: Diagnosis not present

## 2019-07-23 DIAGNOSIS — Z96651 Presence of right artificial knee joint: Secondary | ICD-10-CM

## 2019-07-23 DIAGNOSIS — M8589 Other specified disorders of bone density and structure, multiple sites: Secondary | ICD-10-CM

## 2019-07-23 DIAGNOSIS — Z8719 Personal history of other diseases of the digestive system: Secondary | ICD-10-CM | POA: Diagnosis not present

## 2019-07-23 NOTE — Progress Notes (Signed)
Office Visit Note  Patient: Dawn Diaz             Date of Birth: August 31, 1926           MRN: [id number]             PCP: Shon Baton, MD Referring: Shon Baton, MD Visit Date: 07/23/2019 Occupation: @GUAROCC @  Subjective:  Joint stiffness.   History of Present Illness: Dawn Diaz is a 86 y.o. female with history of rheumatoid arthritis.  She states she has been tolerating Plaquenil well and has not been having any joint swelling.  She continues to have some stiffness in her joints.  She also has discomfort in her replaced joints.  She states she has been struggling with diarrhea for the last 2 months.  She has an appointment coming up with Dr. Fuller Plan.  Activities of Daily Living:  Patient reports morning stiffness for 0 minutes.   Patient Denies nocturnal pain.  Difficulty dressing/grooming: Denies Difficulty climbing stairs: Reports Difficulty getting out of chair: Reports Difficulty using hands for taps, buttons, cutlery, and/or writing: Denies  Review of Systems  Constitutional: Negative for fatigue.  HENT: Positive for mouth dryness. Negative for mouth sores and nose dryness.   Eyes: Negative for itching and dryness.  Respiratory: Negative for shortness of breath, wheezing and difficulty breathing.   Cardiovascular: Negative for chest pain and palpitations.  Gastrointestinal: Positive for diarrhea. Negative for blood in stool and constipation.  Endocrine: Negative for increased urination.  Genitourinary: Negative for difficulty urinating and painful urination.  Musculoskeletal: Positive for arthralgias, joint pain and joint swelling. Negative for morning stiffness.  Skin: Positive for rash.  Allergic/Immunologic: Negative for susceptible to infections.  Neurological: Positive for memory loss. Negative for dizziness, numbness, headaches and weakness.  Hematological: Positive for bruising/bleeding tendency.  Psychiatric/Behavioral: Positive for confusion. Negative for  sleep disturbance.    PMFS History:  Patient Active Problem List   Diagnosis Date Noted  . Depression, major, single episode, moderate ([place]) 03/28/2017  . Pulmonary nodule seen on imaging study 03/28/2017  . Acquired leg length discrepancy 03/28/2017  . Closed displaced fracture of right femoral neck ([place]) 03/22/2017  . Breast cancer, stage 1 ([place])   . Rheumatoid arthritis, seronegative, multiple sites ([place]) 11/15/2016  . Osteopenia 11/15/2016  . Primary osteoarthritis of both hands 11/15/2016  . High risk medications (not anticoagulants) long-term use 11/15/2016  . Family history of malignant neoplasm of gastrointestinal tract 12/25/2013  . Constipation 04/24/2013  . Glaucoma 04/24/2013  . OA (osteoarthritis) of knee 04/15/2013  . History of breast cancer in female 12/20/2010  . DIVERTICULOSIS-COLON 08/06/2009  . IRRITABLE BOWEL SYNDROME 08/06/2009    Past Medical History:  Diagnosis Date  . Anxiety disorder   . Bacterial overgrowth syndrome   . Breast cancer, stage 1 [facility])    '97/recurrent '05 right breast(s/p mastectomy)  . Bunion   . Colon polyps   . Depression   . Diverticulitis   . GERD (gastroesophageal reflux disease)   . Glaucoma   . Hiatal hernia   . History of TIAs    - 13 yrs ago"brief memory loss"  . IBS (irritable bowel syndrome)   . Insomnia   . Irritable bowel syndrome   . Osteoporosis   . Ovarian cyst     Family History  Problem Relation Age of Onset  . Colon cancer Sister 25       Died at 17  . Kidney cancer Father 66  . Pancreatic cancer  Mother 84  . Pancreatic cancer Maternal Grandmother 9  . Cancer Paternal Grandfather        died from "abdominal ca" at young age  . Breast cancer Neg Hx   . Stomach cancer Neg Hx   . Esophageal cancer Neg Hx    Past Surgical History:  Procedure Laterality Date  . ABDOMINAL HYSTERECTOMY    . ANTERIOR APPROACH HEMI HIP ARTHROPLASTY Right 03/22/2017   Procedure: ANTERIOR APPROACH HEMI HIP ARTHROPLASTY;   Surgeon: Rod Can, MD;  Location: [facility];  Service: Orthopedics;  Laterality: Right;  . BREAST BIOPSY     right  . BUNIONECTOMY Bilateral    both, repeat x1  . CATARACT EXTRACTION Left   . KNEE ARTHROPLASTY    . MASTECTOMY  1997   Right  . TONSILLECTOMY    . TOTAL KNEE ARTHROPLASTY Right 04/15/2013   Procedure: RIGHT TOTAL KNEE ARTHROPLASTY;  Surgeon: Gearlean Alf, MD;  Location: [facility];  Service: Orthopedics;  Laterality: Right;   Social History   Social History Narrative  . Not on file   Immunization History  Administered Date(s) Administered  . Influenza, High Dose Seasonal PF 03/22/2018  . Influenza-Unspecified 03/21/2017  . Pneumococcal Polysaccharide-23 09/09/2008  . Td 11/20/2012     Objective: Vital Signs: BP (!) 133/56 (BP Location: Left Arm, Patient Position: Sitting, Cuff Size: Normal)   Pulse (!) 59   Resp 12   Ht 5\' 7"  (1.702 m)   Wt 131 lb (59.4 kg)   BMI 20.52 kg/m    Physical Exam Vitals and nursing note reviewed.  Constitutional:      Appearance: She is well-developed.  HENT:     Head: Normocephalic and atraumatic.  Eyes:     Conjunctiva/sclera: Conjunctivae normal.  Cardiovascular:     Rate and Rhythm: Normal rate and regular rhythm.     Heart sounds: Normal heart sounds.  Pulmonary:     Effort: Pulmonary effort is normal.     Breath sounds: Normal breath sounds.  Abdominal:     General: Bowel sounds are normal.     Palpations: Abdomen is soft.  Musculoskeletal:     Cervical back: Normal range of motion.  Lymphadenopathy:     Cervical: No cervical adenopathy.  Skin:    General: Skin is warm and dry.     Capillary Refill: Capillary refill takes less than 2 seconds.  Neurological:     Mental Status: She is alert and oriented to person, place, and time.  Psychiatric:        Behavior: Behavior normal.      Musculoskeletal Exam: C-spine was in good range of motion.  She has some thoracic kyphosis.  Shoulder joints elbow joints  wrist joints with good range of motion.  She has bilateral MCP thickening PIP and DIP thickening with no synovitis.  She had good range of motion in her hip joints.  Her right hip joint is replaced.  She had good range of motion of her knee joints.  Her right knee joint is replaced which had some discomfort.  No ankle joints or MTP swelling was noted.  CDAI Exam: CDAI Score: 0.4  Patient Global: 2 mm; Provider Global: 2 mm Swollen: 0 ; Tender: 0  Joint Exam 07/23/2019   No joint exam has been documented for this visit   There is currently no information documented on the homunculus. Go to the Rheumatology activity and complete the homunculus joint exam.  Investigation: No additional findings.  Imaging: US  Abdomen Complete  Result Date: 07/15/2019 CLINICAL DATA:  Abdominal pain. Diarrhea. Irritable bowel syndrome. Personal history of breast carcinoma. EXAM: ABDOMEN ULTRASOUND COMPLETE COMPARISON:  Right upper quadrant ultrasound on 07/09/2019 FINDINGS: Gallbladder: No gallstones or wall thickening visualized. No sonographic Murphy sign noted by sonographer. Common bile duct: Diameter: 7 mm, within normal limits for patient age. Liver: No liver masses identified. A calcified granuloma is noted in the posterior right hepatic lobe. Within normal limits in parenchymal echogenicity. Portal vein is patent on color Doppler imaging with normal direction of blood flow towards the liver. IVC: No abnormality visualized. Pancreas: Visualized portion unremarkable. Spleen: Size and appearance within normal limits. Right Kidney: Length: 10.0 cm. Echogenicity within normal limits. A few small cysts are seen measuring approximately 1 cm. Extrarenal pelvis also noted. No mass or hydronephrosis visualized. Left Kidney: Length: 10.2 cm. Echogenicity within normal limits. No mass or hydronephrosis visualized. Abdominal aorta: No aneurysm visualized. Atherosclerotic plaque noted. Other findings: None. IMPRESSION: No  acute findings or other significant abnormality. Electronically Signed   By: Marlaine Hind M.D.   On: 07/15/2019 16:22   US Abdomen Limited RUQ  Result Date: 07/09/2019 CLINICAL DATA:  Abdominal pain and diarrhea EXAM: ULTRASOUND ABDOMEN LIMITED RIGHT UPPER QUADRANT COMPARISON:  None. FINDINGS: Gallbladder: Partially decompressed Common bile duct: Diameter: 7.8 mm. This is within normal limits for the patient's given age. Liver: No focal lesion identified. Within normal limits in parenchymal echogenicity. Portal vein is patent on color Doppler imaging with normal direction of blood flow towards the liver. Other: None. IMPRESSION: No acute abnormality noted. Electronically Signed   By: Inez Catalina M.D.   On: 07/09/2019 12:23    Recent Labs: Lab Results  Component Value Date   WBC 7.4 07/03/2019   HGB 13.5 07/03/2019   PLT 313.0 07/03/2019   NA 139 07/03/2019   K 3.9 07/03/2019   CL 104 07/03/2019   CO2 28 07/03/2019   GLUCOSE 109 (H) 07/03/2019   BUN 17 07/03/2019   CREATININE 0.77 07/03/2019   BILITOT 0.4 07/03/2019   ALKPHOS 89 07/03/2019   AST 20 07/03/2019   ALT 15 07/03/2019   PROT 6.5 07/03/2019   ALBUMIN 3.6 07/03/2019   CALCIUM 9.0 07/03/2019   GFRAA 70 12/27/2018    Speciality Comments: PLQ Eye Exam: 02/08/18 WNL @ [facility] Follow up in 1 year  Procedures:  No procedures performed Allergies: Amoxicillin and Latex   Assessment / Plan:     Visit Diagnoses: Rheumatoid arthritis of multiple sites with negative rheumatoid factor (HCC)-patient's arthritis is well controlled with no synovitis on examination today.  High risk medication use - Plaquenil 200 mg 1 tablet by mouth daily. Eye Exam: 02/08/18.  She has been advised to get eye examination as soon as possible.  Her labs have been stable.  Her next labs will be in 5 months.  Primary osteoarthritis of both hands-she has DIP and PIP thickening.  Joint protection was discussed.  History of total right hip  replacement-doing well.  History of total right knee replacement-she has some chronic discomfort but no swelling.  DDD (degenerative disc disease), cervical-she has fairly good range of motion of her cervical spine.  DDD (degenerative disc disease), lumbar-she is currently not having much discomfort.  Osteopenia of multiple sites - History of right hip fracture and replacement.  She was treated with Arimidex and tamoxifen for breast cancer in the past  History of diverticulosis-patient complains of chronic diarrhea.  She has appointment coming up with  Dr. Fuller Plan.  History of breast cancer  History of scoliosis  History of thyroiditis  History of IBS  History of vitamin D deficiency  Orders: No orders of the defined types were placed in this encounter.  No orders of the defined types were placed in this encounter.   Follow-Up Instructions: Return in about 5 months (around 12/20/2019) for Rheumatoid arthritis, Osteoarthritis.   Bo Merino, MD  Note - This record has been created using Editor, commissioning.  Chart creation errors have been sought, but may not always  have been located. Such creation errors do not reflect on  the standard of medical care.

## 2019-07-30 DIAGNOSIS — Z23 Encounter for immunization: Secondary | ICD-10-CM | POA: Diagnosis not present

## 2019-07-30 DIAGNOSIS — L570 Actinic keratosis: Secondary | ICD-10-CM | POA: Diagnosis not present

## 2019-07-31 DIAGNOSIS — Z23 Encounter for immunization: Secondary | ICD-10-CM | POA: Diagnosis not present

## 2019-08-02 ENCOUNTER — Telehealth: Payer: Self-pay | Admitting: Gastroenterology

## 2019-08-05 NOTE — Telephone Encounter (Signed)
Patient is calling to follow up on refill request °

## 2019-08-05 NOTE — Telephone Encounter (Signed)
Called alternate number and patient answered. Patient states she has not called and her diarrhea has improved. Patient states she is unsure who called our office but it was not her. Informed patient to keep her appt with Dr. Fuller Plan next week and we can discuss at appt.

## 2019-08-05 NOTE — Telephone Encounter (Signed)
Attempted to contact patient and it rand once and stated the call cannot be completed at this time.

## 2019-08-06 ENCOUNTER — Telehealth: Payer: Self-pay | Admitting: Gastroenterology

## 2019-08-06 DIAGNOSIS — Z20828 Contact with and (suspected) exposure to other viral communicable diseases: Secondary | ICD-10-CM | POA: Diagnosis not present

## 2019-08-06 DIAGNOSIS — Z9189 Other specified personal risk factors, not elsewhere classified: Secondary | ICD-10-CM | POA: Diagnosis not present

## 2019-08-06 NOTE — Telephone Encounter (Signed)
Per Dr. Fuller Plan patient can take Imodium twice daily for diarrhea symptoms. Will fax this to [facility] at fax number provided.

## 2019-08-12 DIAGNOSIS — Z9189 Other specified personal risk factors, not elsewhere classified: Secondary | ICD-10-CM | POA: Diagnosis not present

## 2019-08-14 ENCOUNTER — Ambulatory Visit (INDEPENDENT_AMBULATORY_CARE_PROVIDER_SITE_OTHER): Payer: Medicare Other | Admitting: Gastroenterology

## 2019-08-14 ENCOUNTER — Encounter: Payer: Self-pay | Admitting: Gastroenterology

## 2019-08-14 DIAGNOSIS — K580 Irritable bowel syndrome with diarrhea: Secondary | ICD-10-CM

## 2019-08-14 NOTE — Progress Notes (Signed)
    History of Present Illness: This is a 86 year old female who requests virtual visit during covid-19 pandemic.  She is accompanied by Edwena Felty at the assisted living facility.  The patient and Edwena Felty relate that she has had improvement in her diarrhea and urgency since taking dicyclomine on a scheduled basis ac and hs.  She takes Imodium twice daily however she does have occasional breakthrough diarrhea and she would like to increase the dose.  She notes her abdomen has become slightly larger over time however her weight is stable.  She was examined in person in mid January and in mid November.  Her abdominal exam was unremarkable on both occasions.  Current Medications, Allergies, Past Medical History, Past Surgical History, Family History and Social History were reviewed in [employer] record.   Physical Exam: (Very limited with virtual visit)  General: Well developed, well nourished, no acute distress Head: Normocephalic and atraumatic Eyes:  sclerae anicteric, EOMI Ears: Normal auditory acuity Psychological:  Alert and cooperative. Anxious   Assessment and Recommendations:  1. IBS-D.  Continue dicyclomine 10 mg p.o. before meals and at bedtime.  Avoid foods that exacerbate diarrhea or urgency.  Continue Imodium 2 mg p.o. twice daily and in addition take 2 mg p.o. twice daily as needed.  Continue daily probiotic if it is beneficial for her symptoms otherwise she may discontinue.  Patient is advised to contact us if symptoms are not well controlled otherwise we will plan for a follow-up visit in 1 year.    These services were provided via telemedicine, audio and video.  The patient was at her assisted living facility with the assistance of a staff member and the provider was in the office, alone.  We discussed the limitations of evaluation and management by telemedicine and the availability of in person appointments.  Patient consented for this telemedicine visit  and is aware of possible charges for this service.  Office CMA or LPN participated in this telemedicine service.  Time spent on call: 13 minutes

## 2019-08-14 NOTE — Patient Instructions (Addendum)
If you are age 84 or older, your body mass index should be between 23-30. Your There is no height or weight on file to calculate BMI. If this is out of the aforementioned range listed, please consider follow up with your Primary Care Provider.  If you are age 57 or younger, your body mass index should be between 19-25. Your There is no height or weight on file to calculate BMI. If this is out of the aformentioned range listed, please consider follow up with your Primary Care Provider.   Please continue:  Dicyclomine 10 mg 3 times daily with meals and at bed time Imodium 2 mg 1 tablet twice daily and may take an additional tablet twice daily as needed Probiotic daily  Please call in 1 year to schedule a follow up.

## 2019-08-21 DIAGNOSIS — L570 Actinic keratosis: Secondary | ICD-10-CM | POA: Diagnosis not present

## 2019-08-21 DIAGNOSIS — L720 Epidermal cyst: Secondary | ICD-10-CM | POA: Diagnosis not present

## 2019-08-21 DIAGNOSIS — D485 Neoplasm of uncertain behavior of skin: Secondary | ICD-10-CM | POA: Diagnosis not present

## 2019-08-23 ENCOUNTER — Encounter: Payer: Self-pay | Admitting: Internal Medicine

## 2019-08-23 DIAGNOSIS — J309 Allergic rhinitis, unspecified: Secondary | ICD-10-CM

## 2019-08-23 DIAGNOSIS — I129 Hypertensive chronic kidney disease with stage 1 through stage 4 chronic kidney disease, or unspecified chronic kidney disease: Secondary | ICD-10-CM

## 2019-08-23 DIAGNOSIS — D899 Disorder involving the immune mechanism, unspecified: Secondary | ICD-10-CM

## 2019-08-23 DIAGNOSIS — F325 Major depressive disorder, single episode, in full remission: Secondary | ICD-10-CM

## 2019-08-23 DIAGNOSIS — H5789 Other specified disorders of eye and adnexa: Secondary | ICD-10-CM

## 2019-08-23 DIAGNOSIS — E069 Thyroiditis, unspecified: Secondary | ICD-10-CM | POA: Insufficient documentation

## 2019-08-23 DIAGNOSIS — R627 Adult failure to thrive: Secondary | ICD-10-CM

## 2019-08-23 DIAGNOSIS — F329 Major depressive disorder, single episode, unspecified: Secondary | ICD-10-CM | POA: Insufficient documentation

## 2019-08-23 DIAGNOSIS — K529 Noninfective gastroenteritis and colitis, unspecified: Secondary | ICD-10-CM | POA: Insufficient documentation

## 2019-08-23 DIAGNOSIS — F419 Anxiety disorder, unspecified: Secondary | ICD-10-CM

## 2019-08-23 DIAGNOSIS — K5790 Diverticulosis of intestine, part unspecified, without perforation or abscess without bleeding: Secondary | ICD-10-CM | POA: Insufficient documentation

## 2019-08-23 DIAGNOSIS — R1310 Dysphagia, unspecified: Secondary | ICD-10-CM

## 2019-08-23 DIAGNOSIS — G4700 Insomnia, unspecified: Secondary | ICD-10-CM | POA: Insufficient documentation

## 2019-08-28 ENCOUNTER — Encounter: Payer: Self-pay | Admitting: Internal Medicine

## 2019-08-28 ENCOUNTER — Ambulatory Visit: Payer: Medicare Other | Admitting: Internal Medicine

## 2019-08-28 ENCOUNTER — Other Ambulatory Visit: Payer: Self-pay

## 2019-08-28 VITALS — BP 138/78 | HR 80 | Temp 97.9°F | Ht 67.0 in | Wt 133.0 lb

## 2019-08-28 DIAGNOSIS — F339 Major depressive disorder, recurrent, unspecified: Secondary | ICD-10-CM | POA: Diagnosis not present

## 2019-08-28 DIAGNOSIS — T50905A Adverse effect of unspecified drugs, medicaments and biological substances, initial encounter: Secondary | ICD-10-CM | POA: Diagnosis not present

## 2019-08-28 DIAGNOSIS — M0609 Rheumatoid arthritis without rheumatoid factor, multiple sites: Secondary | ICD-10-CM | POA: Diagnosis not present

## 2019-08-28 DIAGNOSIS — M1712 Unilateral primary osteoarthritis, left knee: Secondary | ICD-10-CM

## 2019-08-28 DIAGNOSIS — K5790 Diverticulosis of intestine, part unspecified, without perforation or abscess without bleeding: Secondary | ICD-10-CM | POA: Diagnosis not present

## 2019-08-28 DIAGNOSIS — K580 Irritable bowel syndrome with diarrhea: Secondary | ICD-10-CM | POA: Diagnosis not present

## 2019-08-28 DIAGNOSIS — H4063X0 Glaucoma secondary to drugs, bilateral, stage unspecified: Secondary | ICD-10-CM | POA: Diagnosis not present

## 2019-08-28 DIAGNOSIS — M810 Age-related osteoporosis without current pathological fracture: Secondary | ICD-10-CM

## 2019-08-28 DIAGNOSIS — F0280 Dementia in other diseases classified elsewhere without behavioral disturbance: Secondary | ICD-10-CM

## 2019-08-28 DIAGNOSIS — R2681 Unsteadiness on feet: Secondary | ICD-10-CM | POA: Diagnosis not present

## 2019-08-28 DIAGNOSIS — G301 Alzheimer's disease with late onset: Secondary | ICD-10-CM

## 2019-08-28 MED ORDER — VITAMIN D3 50 MCG (2000 UT) PO CAPS: 2000.0000 [IU] | ORAL_CAPSULE | Freq: Every day | ORAL | 5 refills | Status: DC

## 2019-08-28 MED ORDER — ESCITALOPRAM OXALATE 20 MG PO TABS: 20.0000 mg | ORAL_TABLET | Freq: Every day | ORAL | 5 refills | Status: DC

## 2019-08-28 NOTE — Progress Notes (Signed)
Provider:  Rexene Edison. Mariea Clonts, D.O., C.M.D. Location:   [facility]  Place of Service:   Clinic  Previous PCP: Gayland Curry, DO Patient Care Team: Gayland Curry, DO as PCP - General (Geriatric Medicine)  Extended Emergency Contact Information Primary Emergency Contact: [place] of [place] Phone: [phone] Mobile Phone: [phone] Relation: Daughter  Code Status: DNR, MOST Goals of Care: Advanced Directive information Advanced Directives 08/28/2019  Does Patient Have a Medical Advance Directive? Yes  Type of Paramedic of [place];Out of facility DNR (pink MOST or yellow form)  Does patient want to make changes to medical advance directive? No - Patient declined  Copy of [place] in Chart? -  Would patient like information on creating a medical advance directive? -  Pre-existing out of facility DNR order (yellow form or pink MOST form) -   Chief Complaint  Patient presents with  . Establish Care    new patient to establish care     HPI: Patient is a 86 y.o. female seen today to establish with [facility]  Records have been requested from Dr. Virgina Jock, her previous PCP and some received and reviewed.  She is now living in assisted living at [facility] (had been in [place]).      Biggest concern now is depression and anxiety, per Juliann Pulse, her daughter, who works at [employer] and is here with her.  She's lived by herself now for almost 6 years.  She has been getting lonely and sad since her husband passed away about a year after they moved to [place].  She's also adjusting to new rules.  She does not have the freedom she had.  She lived on a hill at home which was hard to walk on then.  She's happy with how her apt turned out.  She's not sleeping well at night.  She is adjusting to a twin bed from a queen.  She has been on lexapro for a number of years--10mg .    Her stomach affects her sleep.  Diarrhea  comes after going to bed in spurts.  It's hard to get there in time.  1-2 times per day, loose bm.  She will get discomfort down below even in her vagina area--wears pads.  Wears regular pants, then depends.  She does have some urinary incontinence also.    Says her stomach is not good and she had diarrhea in the toilet and mostly out of the toilet.  Says it took ages to clean up.  Says she has a big pudge in the middle.   Her daughter says it might help if she was on an IBS diet.  Didn't want anybody around her this morning.    Says she reads well with the paper.    She is HOH and does not wear her hearing aids.  They are the battery variety and her daughter says lord knows where they are.    No difficulty chewing or swallowing.    She's had her 14 days of isolation.  Her neighbors were a big support system for her.  Her daughter mentions she was not safe to drive anymore and her son has got her car.   Memory:  She admits it's not as good as it should be.  Some short-term memory loss.  Significant problems developed only within the past 6 mos.  They were concerned she would go driving and not know where she was.  She was forgetting how  to get to places where she used to go.  Was forgetting appts.  No hallucinations.  Maybe a little paranoia.  She gets extremely concerned about health issues.    Left knee was starting to hurt--has gotten steroid shots in there.    She is going to the WS exercises classes.  Balance appears poor.   Past Medical History:  Diagnosis Date  . Anxiety disorder   . Bacterial overgrowth syndrome   . Breast cancer, stage 1 [facility])    '97/recurrent '05 right breast(s/p mastectomy)  . Bunion   . Colon polyps   . Depression   . Diverticulitis   . GERD (gastroesophageal reflux disease)   . Glaucoma   . Hiatal hernia   . History of TIAs    - 13 yrs ago"brief memory loss"  . Hypertension   . IBS (irritable bowel syndrome)   . Insomnia   . Irritable bowel syndrome    . Osteoporosis   . Ovarian cyst    Past Surgical History:  Procedure Laterality Date  . ABDOMINAL HYSTERECTOMY    . ANTERIOR APPROACH HEMI HIP ARTHROPLASTY Right 03/22/2017   Procedure: ANTERIOR APPROACH HEMI HIP ARTHROPLASTY;  Surgeon: Rod Can, MD;  Location: [facility];  Service: Orthopedics;  Laterality: Right;  . BREAST BIOPSY     right  . BUNIONECTOMY Bilateral    both, repeat x1  . CATARACT EXTRACTION Left   . KNEE ARTHROPLASTY    . MASTECTOMY  1997   Right  . TONSILLECTOMY    . TOTAL KNEE ARTHROPLASTY Right 04/15/2013   Procedure: RIGHT TOTAL KNEE ARTHROPLASTY;  Surgeon: Gearlean Alf, MD;  Location: [facility];  Service: Orthopedics;  Laterality: Right;    Social History   Socioeconomic History  . Marital status: Widowed    Spouse name: Not on file  . Number of children: 3  . Years of education: Not on file  . Highest education level: Not on file  Occupational History  . Occupation: Retired    Fish farm manager: RETIRED  Tobacco Use  . Smoking status: Former Smoker    Years: 3.00    Types: Cigarettes    Quit date: 04/11/1951    Years since quitting: 68.4  . Smokeless tobacco: Never Used  Substance and Sexual Activity  . Alcohol use: Yes    Comment: occ  . Drug use: No  . Sexual activity: Not Currently  Other Topics Concern  . Not on file  Social History Narrative  . Not on file   Social Determinants of Health   Financial Resource Strain:   . Difficulty of Paying Living Expenses: Not on file  Food Insecurity:   . Worried About Charity fundraiser in the Last Year: Not on file  . Ran Out of Food in the Last Year: Not on file  Transportation Needs:   . Lack of Transportation (Medical): Not on file  . Lack of Transportation (Non-Medical): Not on file  Physical Activity:   . Days of Exercise per Week: Not on file  . Minutes of Exercise per Session: Not on file  Stress:   . Feeling of Stress : Not on file  Social Connections:   . Frequency of Communication  with Friends and Family: Not on file  . Frequency of Social Gatherings with Friends and Family: Not on file  . Attends Religious Services: Not on file  . Active Member of Clubs or Organizations: Not on file  . Attends Archivist Meetings: Not on file  .  Marital Status: Not on file    reports that she quit smoking about 68 years ago. Her smoking use included cigarettes. She quit after 3.00 years of use. She has never used smokeless tobacco. She reports current alcohol use. She reports that she does not use drugs.  Functional Status Survey:    Family History  Problem Relation Age of Onset  . Colon cancer Sister 61       Died at 62  . Kidney cancer Father 66  . Pancreatic cancer Mother 51  . Pancreatic cancer Maternal Grandmother 76  . Cancer Paternal Grandfather        died from "abdominal ca" at young age  . Breast cancer Neg Hx   . Stomach cancer Neg Hx   . Esophageal cancer Neg Hx     Health Maintenance  Topic Date Due  . DEXA SCAN  06/16/1994  . PNA vac Low Risk Adult (2 of 2 - PCV13) 09/09/2009  . TETANUS/TDAP  11/21/2022  . INFLUENZA VACCINE  Completed    Allergies  Allergen Reactions  . Amoxicillin Hives  . Latex Itching    Outpatient Encounter Medications as of 08/28/2019  Medication Sig  . alendronate (FOSAMAX) 70 MG tablet Take 70 mg by mouth once a week. Take with a full glass of water on an empty stomach.  . ALPHAGAN P 0.1 % SOLN Apply 1 drop to eye See admin instructions. Into right eye twice daily.  Marland Kitchen amLODipine (NORVASC) 2.5 MG tablet Take 1 tablet by mouth daily.  Marland Kitchen amLODIPine Besylate-Celecoxib 2.5-200 MG TABS Take 2.5 mg by mouth daily.  Marland Kitchen dicyclomine (BENTYL) 10 MG capsule Take 1 capsule (10 mg total) by mouth 4 (four) times daily -  before meals and at bedtime.  Marland Kitchen escitalopram (LEXAPRO) 10 MG tablet Take 10 mg by mouth daily.  Marland Kitchen guaiFENesin (MUCINEX) 600 MG 12 hr tablet Take 600 mg by mouth daily.  . hydroxychloroquine (PLAQUENIL) 200 MG  tablet TAKE 1 TABLET TWICE A DAY ON MONDAY THROUGH FRIDAY (RHEUMATOID ARTHRITIS)  . loperamide (LOPERAMIDE A-D) 2 MG tablet Take 2 mg by mouth in the morning and at bedtime.   Marland Kitchen LUMIGAN 0.01 % SOLN Place 1 drop into both eyes at bedtime.   . Probiotic Product (ALIGN) 4 MG CAPS Take 1 capsule by mouth daily.  . dorzolamide-timolol (COSOPT) 22.3-6.8 MG/ML ophthalmic solution Place 1 drop into both eyes 2 (two) times daily.    No facility-administered encounter medications on file as of 08/28/2019.    Review of Systems  Constitutional: Negative for chills, fever and weight loss.  HENT: Positive for hearing loss.        Cerumen impaction  Eyes: Positive for blurred vision.       Glaucoma  Respiratory: Negative for cough and shortness of breath.   Cardiovascular: Negative for chest pain, palpitations, orthopnea, leg swelling and PND.  Gastrointestinal: Positive for abdominal pain, diarrhea and heartburn. Negative for blood in stool, constipation, melena, nausea and vomiting.       Depends for patient  Genitourinary: Positive for frequency and urgency. Negative for dysuria.       Incontinence  Musculoskeletal: Positive for falls and joint pain. Negative for back pain.       Left knee--prior steroid injections  Neurological: Negative for dizziness and loss of consciousness.  Endo/Heme/Allergies: Bruises/bleeds easily.  Psychiatric/Behavioral: Positive for depression and memory loss. The patient is nervous/anxious and has insomnia.     Vitals:   08/28/19 1524  BP: 138/78  Pulse: 80  Temp: 97.9 F (36.6 C)  TempSrc: Temporal  SpO2: 97%  Weight: 133 lb (60.3 kg)  Height: 5\' 7"  (1.702 m)   Body mass index is 20.83 kg/m. Physical Exam Vitals reviewed.  Constitutional:      General: She is not in acute distress.    Appearance: Normal appearance. She is not ill-appearing or toxic-appearing.     Comments: Tall thin female  HENT:     Head: Normocephalic and atraumatic.     Right Ear:  There is impacted cerumen.     Left Ear: There is impacted cerumen.     Nose: Nose normal. No congestion.     Mouth/Throat:     Pharynx: Oropharynx is clear. No oropharyngeal exudate.  Eyes:     Extraocular Movements: Extraocular movements intact.     Pupils: Pupils are equal, round, and reactive to light.     Comments: Some erythema of eyes  Cardiovascular:     Rate and Rhythm: Normal rate and regular rhythm.     Pulses: Normal pulses.     Heart sounds: Normal heart sounds.  Pulmonary:     Effort: Pulmonary effort is normal.     Breath sounds: Normal breath sounds. No wheezing, rhonchi or rales.  Abdominal:     General: Bowel sounds are normal. There is no distension.     Palpations: Abdomen is soft.     Tenderness: There is no abdominal tenderness. There is no guarding or rebound.  Musculoskeletal:        General: Swelling and tenderness present. Normal range of motion.     Cervical back: Neck supple.     Right lower leg: No edema.     Left lower leg: No edema.     Comments: Tenderness left knee  Lymphadenopathy:     Cervical: No cervical adenopathy.  Skin:    General: Skin is warm and dry.     Capillary Refill: Capillary refill takes less than 2 seconds.  Neurological:     General: No focal deficit present.     Mental Status: She is alert and oriented to person, place, and time.     Cranial Nerves: No cranial nerve deficit.     Sensory: No sensory deficit.     Motor: No weakness.     Coordination: Coordination normal.     Gait: Gait abnormal.     Deep Tendon Reflexes: Reflexes normal.  Psychiatric:        Mood and Affect: Mood normal.        Behavior: Behavior normal.        Thought Content: Thought content normal.        Judgment: Judgment normal.     Labs reviewed: Basic Metabolic Panel: Recent Labs    12/27/18 1039 01/07/19 0000 07/03/19 1142  NA 140  --  139  K 4.6  --  3.9  CL 100  --  104  CO2 32  --  28  GLUCOSE 89  --  109*  BUN 19 16 17    CREATININE 0.85  --  0.77  CALCIUM 9.6  --  9.0   Liver Function Tests: Recent Labs    12/27/18 1039 07/03/19 1142  AST 22 20  ALT 14 15  ALKPHOS  --  89  BILITOT 0.6 0.4  PROT 6.4 6.5  ALBUMIN  --  3.6   No results for input(s): LIPASE, AMYLASE in the last 8760 hours. No results for input(s): AMMONIA in the last  8760 hours. CBC: Recent Labs    12/27/18 1039 07/03/19 1142  WBC 5.6 7.4  NEUTROABS 3,455 5.5  HGB 14.6 13.5  HCT 44.7 40.5  MCV 91.4 88.5  PLT 225 313.0   Cardiac Enzymes: No results for input(s): CKTOTAL, CKMB, CKMBINDEX, TROPONINI in the last 8760 hours. BNP: Invalid input(s): POCBNP No results found for: HGBA1C Lab Results  Component Value Date   TSH 1.71 07/03/2019   No results found for: VITAMINB12 No results found for: FOLATE No results found for: IRON, TIBC, FERRITIN Assessment/Plan 1. Irritable bowel syndrome with diarrhea -will increase loperamide so patient will have scheduled bid and then 2 additional doses if needed b/c this is interfering with her sleep at night and she's up cleaning up her stools -also reviewed proper use of adults undergarments (appears she wears the always silouhette, but has been wearing htem OVER her regular underwear rather than under or alone) and her daughter would like for her incontinence supplies to be ordered thru [place] -supply clerk notified--need size preference from nursing to order -also on bentyl tid before meals and at hs which is highly anticholinergic for someone with dementia  2. Senile osteoporosis - continue fosamax therapy -cont q 2 yr bone densities thru rheum - Cholecalciferol (VITAMIN D3) 50 MCG (2000 UT) capsule; Take 1 capsule (2,000 Units total) by mouth daily.  Dispense: 30 capsule; Refill: 5  3. Primary osteoarthritis of left knee -I will visit her in her apt on Tuesday to do her depomedrol injection -may use tylenol, voltaren gel, other topicals of choice  4. Recurrent depression  ([place]) -had initially with husband's death and grieving process -increase lexapro to 20mg  daily  5. Rheumatoid arthritis, seronegative, multiple sites ([place]) -followed by rheum--Dr. Birdie Sons office -on plaquenil therapy -gets regular eye exams and glaucoma mgt  6. Diverticulosis -stable, maintain adequate fiber in diet and hydration to prevent diverticulitis  7. 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  Unsteady gait -referred to PT for balance and assistive device training  9.  Alzheimer's disease (suspect is type)--late onset -cognitive decline continues to progress -may have a little paranoia but no hallucinations per her daughter -needs help with incontinence care, meds, appts, etc--clearly more than mild cognitive impairment at this point -could not find MMSE in AL chart--needs one if not done already  Labs/tests ordered:  Flush ears in AL due to cerumen impaction, PT  I will see her Tuesday for knee injection in her apt 12/04/2019   Analysse Quinonez L. Sybrina Laning, D.O. [facility] [address], [place] [address] Cell Phone (Mon-Fri 8am-5pm):  [phone] On Call:  [phone] & follow prompts after 5pm & weekends Office Phone:  [phone] Office Fax:  [phone]

## 2019-09-03 ENCOUNTER — Non-Acute Institutional Stay: Payer: Medicare Other | Admitting: Internal Medicine

## 2019-09-03 ENCOUNTER — Encounter: Payer: Self-pay | Admitting: Internal Medicine

## 2019-09-03 DIAGNOSIS — M1712 Unilateral primary osteoarthritis, left knee: Secondary | ICD-10-CM | POA: Diagnosis not present

## 2019-09-03 NOTE — Progress Notes (Signed)
Patient ID: Dawn Diaz, female   DOB: [birth year], 86 y.o.   MRN: [id number]  Location:   [facility] Room Number: 622 Place of Service:  [id number]) Provider:   Gayland Curry, DO  Patient Care Team: Gayland Curry, DO as PCP - General (Geriatric Medicine)  Extended Emergency Contact Information Primary Emergency Contact: [place] of [place] Phone: [phone] Mobile Phone: [phone] Relation: Daughter  Code Status:  DNR Goals of care: Advanced Directive information Advanced Directives 09/03/2019  Does Patient Have a Medical Advance Directive? Yes  Type of Paramedic of [place];Out of facility DNR (pink MOST or yellow form)  Does patient want to make changes to medical advance directive? No - Patient declined  Copy of [place] in Chart? -  Would patient like information on creating a medical advance directive? -  Pre-existing out of facility DNR order (yellow form or pink MOST form) Yellow form placed in chart (order not valid for inpatient use)     Chief Complaint  Patient presents with  . Acute Visit    knee injection     HPI:  Pt is a 86 y.o. female with dementia, depression, IBS, senile osteoporosis, right hip fx, breast cancer, glaucoma, and unsteady gait with fall history seen today for an acute visit for left knee injection.  She was just seen last week to establish care.  She has been having more difficulty ambulating safely due to knee pain.  She has previously benefited from steroid injection in the left knee and requests this to be done today.  Past Medical History:  Diagnosis Date  . Anxiety disorder   . Bacterial overgrowth syndrome   . Breast cancer, stage 1 [facility])    '97/recurrent '05 right breast(s/p mastectomy)  . Bunion   . Colon polyps   . Depression   . Diverticulitis   . GERD (gastroesophageal reflux disease)   . Glaucoma   . Hiatal  hernia   . History of TIAs    - 13 yrs ago"brief memory loss"  . Hypertension   . IBS (irritable bowel syndrome)   . Insomnia   . Irritable bowel syndrome   . Osteoporosis   . Ovarian cyst    Past Surgical History:  Procedure Laterality Date  . ABDOMINAL HYSTERECTOMY    . ANTERIOR APPROACH HEMI HIP ARTHROPLASTY Right 03/22/2017   Procedure: ANTERIOR APPROACH HEMI HIP ARTHROPLASTY;  Surgeon: Rod Can, MD;  Location: [facility];  Service: Orthopedics;  Laterality: Right;  . BREAST BIOPSY     right  . BUNIONECTOMY Bilateral    both, repeat x1  . CATARACT EXTRACTION Left   . KNEE ARTHROPLASTY    . MASTECTOMY  1997   Right  . TONSILLECTOMY    . TOTAL KNEE ARTHROPLASTY Right 04/15/2013   Procedure: RIGHT TOTAL KNEE ARTHROPLASTY;  Surgeon: Gearlean Alf, MD;  [facility];  Service: Orthopedics;  Laterality: Right;    Allergies  Allergen Reactions  . Amoxicillin Hives  . Latex Itching    Outpatient Encounter Medications as of 09/03/2019  Medication Sig  . alendronate (FOSAMAX) 70 MG tablet Take 70 mg by mouth once a week. Take with a full glass of water on an empty stomach.  . ALPHAGAN P 0.1 % SOLN Apply 1 drop to eye See admin instructions. Into right eye twice daily.  Marland Kitchen amLODipine (NORVASC) 2.5 MG tablet Take 1 tablet by mouth daily.  . Cholecalciferol (VITAMIN  D3) 50 MCG (2000 UT) capsule Take 1 capsule (2,000 Units total) by mouth daily.  Marland Kitchen dicyclomine (BENTYL) 10 MG capsule Take 1 capsule (10 mg total) by mouth 4 (four) times daily -  before meals and at bedtime.  . dorzolamide-timolol (COSOPT) 22.3-6.8 MG/ML ophthalmic solution Place 1 drop into both eyes 2 (two) times daily.   Marland Kitchen escitalopram (LEXAPRO) 20 MG tablet Take 1 tablet (20 mg total) by mouth daily.  Marland Kitchen guaiFENesin (MUCINEX) 600 MG 12 hr tablet Take 600 mg by mouth daily.  . hydroxychloroquine (PLAQUENIL) 200 MG tablet TAKE 1 TABLET TWICE A DAY ON MONDAY THROUGH FRIDAY (RHEUMATOID ARTHRITIS)  . loperamide  (LOPERAMIDE A-D) 2 MG tablet Take 2 mg by mouth in the morning and at bedtime. And 2mg  po bid prn diarrhea  . LUMIGAN 0.01 % SOLN Place 1 drop into both eyes at bedtime.   . Probiotic Product (ALIGN) 4 MG CAPS Take 1 capsule by mouth daily.   No facility-administered encounter medications on file as of 09/03/2019.    Review of Systems  Constitutional: Negative for activity change, appetite change, chills, fatigue and unexpected weight change.  HENT: Negative for congestion, sore throat and trouble swallowing.   Eyes: Negative for visual disturbance.       Glaucoma  Respiratory: Negative for cough and shortness of breath.   Gastrointestinal: Negative for abdominal pain, nausea and vomiting.  Genitourinary: Negative for dysuria.  Musculoskeletal: Positive for arthralgias, gait problem and joint swelling. Negative for back pain.  Neurological: Negative for dizziness and weakness.  Psychiatric/Behavioral: Positive for confusion.    Immunization History  Administered Date(s) Administered  . DTaP 11/20/2012  . Influenza, High Dose Seasonal PF 03/22/2018  . Influenza-Unspecified 03/21/2017, 03/22/2018  . Pneumococcal Polysaccharide-23 09/09/2008  . Td 11/20/2012   Pertinent  Health Maintenance Due  Topic Date Due  . DEXA SCAN  Never done  . PNA vac Low Risk Adult (2 of 2 - PCV13) 09/09/2009  . INFLUENZA VACCINE  Completed   Fall Risk  08/28/2019  Falls in the past year? 0  Number falls in past yr: 0  Injury with Fall? 0   Functional Status Survey:    Vitals:   09/03/19 0942  BP: (!) 143/81  Pulse: 84  Temp: 97.7 F (36.5 C)  SpO2: 99%  Weight: 133 lb 9.6 oz (60.6 kg)  Height: 5\' 7"  (1.702 m)   Body mass index is 20.92 kg/m. Physical Exam Vitals reviewed.  Constitutional:      Appearance: Normal appearance.  Cardiovascular:     Rate and Rhythm: Normal rate and regular rhythm.  Pulmonary:     Effort: Pulmonary effort is normal.     Breath sounds: Normal breath  sounds.  Musculoskeletal:        General: Swelling and tenderness present.     Comments: Left knee tenderness  Skin:    General: Skin is warm and dry.  Neurological:     Mental Status: She is alert. Mental status is at baseline.     Gait: Gait abnormal.  Psychiatric:        Mood and Affect: Mood normal.     Labs reviewed: Recent Labs    12/27/18 1039 01/07/19 0000 07/03/19 1142  NA 140  --  139  K 4.6  --  3.9  CL 100  --  104  CO2 32  --  28  GLUCOSE 89  --  109*  BUN 19 16 17   CREATININE 0.85  --  0.77  CALCIUM 9.6  --  9.0   Recent Labs    12/27/18 1039 07/03/19 1142  AST 22 20  ALT 14 15  ALKPHOS  --  89  BILITOT 0.6 0.4  PROT 6.4 6.5  ALBUMIN  --  3.6   Recent Labs    12/27/18 1039 07/03/19 1142  WBC 5.6 7.4  NEUTROABS 3,455 5.5  HGB 14.6 13.5  HCT 44.7 40.5  MCV 91.4 88.5  PLT 225 313.0   Lab Results  Component Value Date   TSH 1.71 07/03/2019   No results found for: HGBA1C No results found for: CHOL, HDL, LDLCALC, LDLDIRECT, TRIG, CHOLHDL  Assessment/Plan 1. Localized osteoarthritis of left knee -left knee was cleansed with betadine medially x 2, then sprayed with bupivicaine topical anesthetic and injected with 40mg /1cc depomedrol and 2cc 1% lidocaine -pt noted immediate relief and was holding onto her furniture swinging the leg to and fro and later seen walking with her daughter much more easily  Family/ staff Communication: discussed with AL nurse and pt's daughter  Labs/tests ordered:  No new  Keep appt 12/04/19 as scheduled  Denzal Meir L. Reginaldo Hazard, D.O. [facility] [address], [place] [address] Cell Phone (Mon-Fri 8am-5pm):  [phone] On Call:  [phone] & follow prompts after 5pm & weekends Office Phone:  [phone] Office Fax:  [phone]

## 2019-09-04 DIAGNOSIS — L570 Actinic keratosis: Secondary | ICD-10-CM | POA: Diagnosis not present

## 2019-09-04 DIAGNOSIS — Z85828 Personal history of other malignant neoplasm of skin: Secondary | ICD-10-CM | POA: Diagnosis not present

## 2019-09-04 DIAGNOSIS — I872 Venous insufficiency (chronic) (peripheral): Secondary | ICD-10-CM | POA: Diagnosis not present

## 2019-09-04 DIAGNOSIS — L814 Other melanin hyperpigmentation: Secondary | ICD-10-CM | POA: Diagnosis not present

## 2019-09-04 DIAGNOSIS — D1801 Hemangioma of skin and subcutaneous tissue: Secondary | ICD-10-CM | POA: Diagnosis not present

## 2019-09-05 DIAGNOSIS — R2689 Other abnormalities of gait and mobility: Secondary | ICD-10-CM | POA: Diagnosis not present

## 2019-09-05 DIAGNOSIS — Z9181 History of falling: Secondary | ICD-10-CM | POA: Diagnosis not present

## 2019-09-05 DIAGNOSIS — R278 Other lack of coordination: Secondary | ICD-10-CM | POA: Diagnosis not present

## 2019-09-05 DIAGNOSIS — M810 Age-related osteoporosis without current pathological fracture: Secondary | ICD-10-CM | POA: Diagnosis not present

## 2019-09-05 DIAGNOSIS — R2681 Unsteadiness on feet: Secondary | ICD-10-CM | POA: Diagnosis not present

## 2019-09-05 DIAGNOSIS — M2041 Other hammer toe(s) (acquired), right foot: Secondary | ICD-10-CM | POA: Diagnosis not present

## 2019-09-05 DIAGNOSIS — M722 Plantar fascial fibromatosis: Secondary | ICD-10-CM | POA: Diagnosis not present

## 2019-09-09 DIAGNOSIS — M810 Age-related osteoporosis without current pathological fracture: Secondary | ICD-10-CM | POA: Diagnosis not present

## 2019-09-09 DIAGNOSIS — M722 Plantar fascial fibromatosis: Secondary | ICD-10-CM | POA: Diagnosis not present

## 2019-09-09 DIAGNOSIS — R2681 Unsteadiness on feet: Secondary | ICD-10-CM | POA: Diagnosis not present

## 2019-09-09 DIAGNOSIS — R278 Other lack of coordination: Secondary | ICD-10-CM | POA: Diagnosis not present

## 2019-09-09 DIAGNOSIS — R2689 Other abnormalities of gait and mobility: Secondary | ICD-10-CM | POA: Diagnosis not present

## 2019-09-09 DIAGNOSIS — Z9181 History of falling: Secondary | ICD-10-CM | POA: Diagnosis not present

## 2019-09-09 MED ORDER — METHYLPREDNISOLONE ACETATE 40 MG/ML IJ SUSP: 40.0000 mg | Freq: Once | INTRAMUSCULAR | Status: DC

## 2019-09-10 ENCOUNTER — Encounter: Payer: Self-pay | Admitting: Podiatry

## 2019-09-10 ENCOUNTER — Other Ambulatory Visit: Payer: Self-pay

## 2019-09-10 ENCOUNTER — Ambulatory Visit (INDEPENDENT_AMBULATORY_CARE_PROVIDER_SITE_OTHER): Payer: Medicare Other | Admitting: Podiatry

## 2019-09-10 VITALS — BP 146/73 | HR 68 | Resp 14

## 2019-09-10 DIAGNOSIS — B351 Tinea unguium: Secondary | ICD-10-CM | POA: Diagnosis not present

## 2019-09-10 DIAGNOSIS — M79674 Pain in right toe(s): Secondary | ICD-10-CM | POA: Diagnosis not present

## 2019-09-10 DIAGNOSIS — M79675 Pain in left toe(s): Secondary | ICD-10-CM

## 2019-09-10 DIAGNOSIS — M2042 Other hammer toe(s) (acquired), left foot: Secondary | ICD-10-CM

## 2019-09-10 DIAGNOSIS — Q828 Other specified congenital malformations of skin: Secondary | ICD-10-CM

## 2019-09-10 DIAGNOSIS — M2041 Other hammer toe(s) (acquired), right foot: Secondary | ICD-10-CM

## 2019-09-11 DIAGNOSIS — R2681 Unsteadiness on feet: Secondary | ICD-10-CM | POA: Diagnosis not present

## 2019-09-11 DIAGNOSIS — M722 Plantar fascial fibromatosis: Secondary | ICD-10-CM | POA: Diagnosis not present

## 2019-09-11 DIAGNOSIS — R278 Other lack of coordination: Secondary | ICD-10-CM | POA: Diagnosis not present

## 2019-09-11 DIAGNOSIS — M810 Age-related osteoporosis without current pathological fracture: Secondary | ICD-10-CM | POA: Diagnosis not present

## 2019-09-11 DIAGNOSIS — Z9181 History of falling: Secondary | ICD-10-CM | POA: Diagnosis not present

## 2019-09-11 DIAGNOSIS — R2689 Other abnormalities of gait and mobility: Secondary | ICD-10-CM | POA: Diagnosis not present

## 2019-09-11 NOTE — Progress Notes (Signed)
Subjective:   Patient ID: Dawn Diaz, female   DOB: 86 y.o.   MRN: [id number]   HPI 86 year old female presents the office today with her family member for concerns of thick, elongated toenails she cannot trim her self as well as for callus ulcer to her left foot as well as a hammertoe on the right second toe.  Denies any redness or drainage or any swelling.   Review of Systems  All other systems reviewed and are negative.  Past Medical History:  Diagnosis Date  . Anxiety disorder   . Bacterial overgrowth syndrome   . Breast cancer, stage 1 [facility])    '97/recurrent '05 right breast(s/p mastectomy)  . Bunion   . Colon polyps   . Depression   . Diverticulitis   . GERD (gastroesophageal reflux disease)   . Glaucoma   . Hiatal hernia   . History of TIAs    - 13 yrs ago"brief memory loss"  . Hypertension   . IBS (irritable bowel syndrome)   . Insomnia   . Irritable bowel syndrome   . Osteoporosis   . Ovarian cyst     Past Surgical History:  Procedure Laterality Date  . ABDOMINAL HYSTERECTOMY    . ANTERIOR APPROACH HEMI HIP ARTHROPLASTY Right 03/22/2017   Procedure: ANTERIOR APPROACH HEMI HIP ARTHROPLASTY;  Surgeon: Rod Can, MD;  Location: [place];  Service: Orthopedics;  Laterality: Right;  . BREAST BIOPSY     right  . BUNIONECTOMY Bilateral    both, repeat x1  . CATARACT EXTRACTION Left   . KNEE ARTHROPLASTY    . MASTECTOMY  1997   Right  . TONSILLECTOMY    . TOTAL KNEE ARTHROPLASTY Right 04/15/2013   Procedure: RIGHT TOTAL KNEE ARTHROPLASTY;  Surgeon: Gearlean Alf, MD;  [facility];  Service: Orthopedics;  Laterality: Right;     Current Outpatient Medications:  .  alendronate (FOSAMAX) 70 MG tablet, Take 70 mg by mouth once a week. Take with a full glass of water on an empty stomach., Disp: , Rfl:  .  ALPHAGAN P 0.1 % SOLN, Apply 1 drop to eye See admin instructions. Into right eye twice daily., Disp: , Rfl: 12 .  amLODipine (NORVASC) 2.5 MG tablet,  Take 1 tablet by mouth daily., Disp: , Rfl:  .  Cholecalciferol (VITAMIN D3) 50 MCG (2000 UT) capsule, Take 1 capsule (2,000 Units total) by mouth daily., Disp: 30 capsule, Rfl: 5 .  dicyclomine (BENTYL) 10 MG capsule, Take 1 capsule (10 mg total) by mouth 4 (four) times daily -  before meals and at bedtime., Disp: 60 capsule, Rfl: 0 .  dorzolamide-timolol (COSOPT) 22.3-6.8 MG/ML ophthalmic solution, Place 1 drop into both eyes 2 (two) times daily. , Disp: , Rfl: 12 .  escitalopram (LEXAPRO) 20 MG tablet, Take 1 tablet (20 mg total) by mouth daily., Disp: 30 tablet, Rfl: 5 .  guaiFENesin (MUCINEX) 600 MG 12 hr tablet, Take 600 mg by mouth daily., Disp: , Rfl:  .  hydroxychloroquine (PLAQUENIL) 200 MG tablet, TAKE 1 TABLET TWICE A DAY ON MONDAY THROUGH FRIDAY (RHEUMATOID ARTHRITIS), Disp: 40 tablet, Rfl: 0 .  loperamide (IMODIUM) 2 MG capsule, , Disp: , Rfl:  .  loperamide (LOPERAMIDE A-D) 2 MG tablet, Take 2 mg by mouth in the morning and at bedtime. And 2mg  po bid prn diarrhea, Disp: , Rfl:  .  LUMIGAN 0.01 % SOLN, Place 1 drop into both eyes at bedtime. , Disp: , Rfl: 4 .  Probiotic Product (ALIGN) 4 MG CAPS, Take 1 capsule by mouth daily., Disp: , Rfl:   Current Facility-Administered Medications:  .  methylPREDNISolone acetate (DEPO-MEDROL) injection 40 mg, 40 mg, Intra-articular, Once, Reed, Tiffany L, DO  Allergies  Allergen Reactions  . Amoxicillin Hives  . Latex Itching         Objective:  Physical Exam  General: AAO x3, NAD  Dermatological: Nails are hypertrophic, dystrophic, brittle, discolored, elongated 10. No surrounding redness or drainage. Tenderness nails 1-5 bilaterally.  Hyperkeratotic lesion with dried blood left foot submetatarsal 1 and minimally to the right dorsal second toe.  No ongoing ulceration or any signs of infection.  No open lesions or pre-ulcerative lesions are identified today.  Vascular: Dorsalis Pedis artery and Posterior Tibial artery pedal pulses are  2/4 bilateral with immedate capillary fill time.There is no pain with calf compression, swelling, warmth, erythema.   Neruologic: Sensation intact with Thornell Mule monofilament.  Musculoskeletal: Hammertoe deformity which is rigid most on the right second toe muscular strength 5/5 in all groups tested bilateral.  Gait: Unassisted, Nonantalgic.       Assessment:   Symptomatic onychomycosis, hyperkeratotic lesions    Plan:  -Treatment options discussed including all alternatives, risks, and complications -Etiology of symptoms were discussed -Nails debrided 10 without complications or bleeding. -Hyperkeratotic lesion sharply debrided x2 without complications or bleeding. -Offloading pads for the hammertoes.  -Daily foot inspection -Follow-up in 3 months or sooner if any problems arise. In the meantime, encouraged to call the office with any questions, concerns, change in symptoms.   Celesta Gentile, DPM

## 2019-09-13 DIAGNOSIS — R1310 Dysphagia, unspecified: Secondary | ICD-10-CM | POA: Insufficient documentation

## 2019-09-13 DIAGNOSIS — D899 Disorder involving the immune mechanism, unspecified: Secondary | ICD-10-CM | POA: Insufficient documentation

## 2019-09-13 DIAGNOSIS — F325 Major depressive disorder, single episode, in full remission: Secondary | ICD-10-CM | POA: Insufficient documentation

## 2019-09-13 DIAGNOSIS — R2681 Unsteadiness on feet: Secondary | ICD-10-CM | POA: Diagnosis not present

## 2019-09-13 DIAGNOSIS — R278 Other lack of coordination: Secondary | ICD-10-CM | POA: Diagnosis not present

## 2019-09-13 DIAGNOSIS — M722 Plantar fascial fibromatosis: Secondary | ICD-10-CM | POA: Diagnosis not present

## 2019-09-13 DIAGNOSIS — H5789 Other specified disorders of eye and adnexa: Secondary | ICD-10-CM | POA: Insufficient documentation

## 2019-09-13 DIAGNOSIS — Z9181 History of falling: Secondary | ICD-10-CM | POA: Diagnosis not present

## 2019-09-13 DIAGNOSIS — M810 Age-related osteoporosis without current pathological fracture: Secondary | ICD-10-CM | POA: Diagnosis not present

## 2019-09-13 DIAGNOSIS — J479 Bronchiectasis, uncomplicated: Secondary | ICD-10-CM | POA: Insufficient documentation

## 2019-09-13 DIAGNOSIS — R2689 Other abnormalities of gait and mobility: Secondary | ICD-10-CM | POA: Diagnosis not present

## 2019-09-13 DIAGNOSIS — R627 Adult failure to thrive: Secondary | ICD-10-CM | POA: Insufficient documentation

## 2019-09-13 DIAGNOSIS — I129 Hypertensive chronic kidney disease with stage 1 through stage 4 chronic kidney disease, or unspecified chronic kidney disease: Secondary | ICD-10-CM | POA: Insufficient documentation

## 2019-09-16 DIAGNOSIS — R2689 Other abnormalities of gait and mobility: Secondary | ICD-10-CM | POA: Diagnosis not present

## 2019-09-16 DIAGNOSIS — M722 Plantar fascial fibromatosis: Secondary | ICD-10-CM | POA: Diagnosis not present

## 2019-09-16 DIAGNOSIS — Z9181 History of falling: Secondary | ICD-10-CM | POA: Diagnosis not present

## 2019-09-16 DIAGNOSIS — R278 Other lack of coordination: Secondary | ICD-10-CM | POA: Diagnosis not present

## 2019-09-16 DIAGNOSIS — M810 Age-related osteoporosis without current pathological fracture: Secondary | ICD-10-CM | POA: Diagnosis not present

## 2019-09-16 DIAGNOSIS — R2681 Unsteadiness on feet: Secondary | ICD-10-CM | POA: Diagnosis not present

## 2019-09-23 DIAGNOSIS — M2041 Other hammer toe(s) (acquired), right foot: Secondary | ICD-10-CM | POA: Diagnosis not present

## 2019-09-23 DIAGNOSIS — Z9181 History of falling: Secondary | ICD-10-CM | POA: Diagnosis not present

## 2019-09-23 DIAGNOSIS — R2689 Other abnormalities of gait and mobility: Secondary | ICD-10-CM | POA: Diagnosis not present

## 2019-09-23 DIAGNOSIS — R2681 Unsteadiness on feet: Secondary | ICD-10-CM | POA: Diagnosis not present

## 2019-09-23 DIAGNOSIS — M722 Plantar fascial fibromatosis: Secondary | ICD-10-CM | POA: Diagnosis not present

## 2019-09-23 DIAGNOSIS — M810 Age-related osteoporosis without current pathological fracture: Secondary | ICD-10-CM | POA: Diagnosis not present

## 2019-09-23 DIAGNOSIS — R278 Other lack of coordination: Secondary | ICD-10-CM | POA: Diagnosis not present

## 2019-10-01 ENCOUNTER — Ambulatory Visit: Payer: Medicare Other | Admitting: Orthopaedic Surgery

## 2019-10-08 ENCOUNTER — Non-Acute Institutional Stay: Payer: Medicare Other | Admitting: Internal Medicine

## 2019-10-08 ENCOUNTER — Encounter: Payer: Self-pay | Admitting: Internal Medicine

## 2019-10-08 ENCOUNTER — Telehealth: Payer: Self-pay | Admitting: *Deleted

## 2019-10-08 DIAGNOSIS — M1712 Unilateral primary osteoarthritis, left knee: Secondary | ICD-10-CM

## 2019-10-08 NOTE — Telephone Encounter (Signed)
Ok I will let AL know  Spoke with shontaya and let her know.

## 2019-10-08 NOTE — Progress Notes (Signed)
Patient ID: Dawn Diaz, female   DOB: 07-17-1926, 86 y.o.   MRN: [id number]  Location:  [place] Room Number: 622 Place of Service:  ALF (13) Provider:  Bruin Bolger L. Mariea Clonts, D.O., C.M.D.  Gayland Curry, DO  Patient Care Team: Gayland Curry, DO as PCP - General (Geriatric Medicine) Ladene Artist, MD (Gastroenterology)  Extended Emergency Contact Information Primary Emergency Contact: [employer] [facility] [place] Phone: [phone] Mobile Phone: [phone] Relation: Daughter  Code Status:  DNR Goals of care: Advanced Directive information Advanced Directives 10/08/2019  Does Patient Have a Medical Advance Directive? Yes  Type of Paramedic of [place];Out of facility DNR (pink MOST or yellow form)  Does patient want to make changes to medical advance directive? No - Patient declined  Copy of [place] in Chart? Yes - validated most recent copy scanned in chart (See row information)  Would patient like information on creating a medical advance directive? -  Pre-existing out of facility DNR order (yellow form or pink MOST form) Pink MOST/Yellow Form most recent copy in chart - Physician notified to receive inpatient order     Chief Complaint  Patient presents with  . Acute Visit    Knee pain from fall on 10/07/2019    HPI:  Pt is a 86 y.o. female seen today for an acute visit for increase in her left knee pain.  Note she's fallen several times recently and does not use the walker therapy prescribed nor the cane she has.  I visited her in her apt and she was seated on her sofa and her cane was at her door.  The left knee was bothering her tremendously when I saw her to establish with me 3/10 and then I did inject her with depomedrol on 3/16 which only gave her a few days of relief so OA must be too advanced.  She keeps asking nursing what to do about her knee so they asked me to  check on her.  It does not appear different.   Past Medical History:  Diagnosis Date  . Anxiety disorder   . Bacterial overgrowth syndrome   . Breast cancer, stage 1 [facility])    '97/recurrent '05 right breast(s/p mastectomy)  . Bunion   . Colon polyps   . Depression   . Diverticulitis   . GERD (gastroesophageal reflux disease)   . Glaucoma   . Hiatal hernia   . History of TIAs    - 13 yrs ago"brief memory loss"  . Hypertension   . IBS (irritable bowel syndrome)   . Insomnia   . Irritable bowel syndrome   . Osteoporosis   . Ovarian cyst    Past Surgical History:  Procedure Laterality Date  . ABDOMINAL HYSTERECTOMY    . ANTERIOR APPROACH HEMI HIP ARTHROPLASTY Right 03/22/2017   Procedure: ANTERIOR APPROACH HEMI HIP ARTHROPLASTY;  Surgeon: Rod Can, MD;  Location: [place];  Service: Orthopedics;  Laterality: Right;  . BREAST BIOPSY     right  . BUNIONECTOMY Bilateral    both, repeat x1  . CATARACT EXTRACTION Left   . KNEE ARTHROPLASTY    . MASTECTOMY  1997   Right  . TONSILLECTOMY    . TOTAL KNEE ARTHROPLASTY Right 04/15/2013   Procedure: RIGHT TOTAL KNEE ARTHROPLASTY;  Surgeon: Gearlean Alf, MD;  [facility];  Service: Orthopedics;  Laterality: Right;    Allergies  Allergen Reactions  . Amoxicillin  Hives  . Latex Itching    Outpatient Encounter Medications as of 10/08/2019  Medication Sig  . alendronate (FOSAMAX) 70 MG tablet Take 70 mg by mouth once a week. Take with a full glass of water on an empty stomach.  . ALPHAGAN P 0.1 % SOLN Apply 1 drop to eye See admin instructions. Into right eye twice daily.  Marland Kitchen amLODipine (NORVASC) 2.5 MG tablet Take 1 tablet by mouth daily.  Marland Kitchen aspirin EC 81 MG tablet Take 81 mg by mouth daily.  . Cholecalciferol (VITAMIN D3) 50 MCG (2000 UT) capsule Take 1 capsule (2,000 Units total) by mouth daily.  Marland Kitchen dicyclomine (BENTYL) 10 MG capsule Take 1 capsule (10 mg total) by mouth 4 (four) times daily -  before meals and at  bedtime.  . dorzolamide-timolol (COSOPT) 22.3-6.8 MG/ML ophthalmic solution Place 1 drop into both eyes 2 (two) times daily.   Marland Kitchen escitalopram (LEXAPRO) 20 MG tablet Take 1 tablet (20 mg total) by mouth daily.  Marland Kitchen guaiFENesin (MUCINEX) 600 MG 12 hr tablet Take 600 mg by mouth daily.  . hydroxychloroquine (PLAQUENIL) 200 MG tablet TAKE 1 TABLET TWICE A DAY ON MONDAY THROUGH FRIDAY (RHEUMATOID ARTHRITIS)  . loperamide (IMODIUM) 2 MG capsule   . loperamide (LOPERAMIDE A-D) 2 MG tablet Take 2 mg by mouth in the morning and at bedtime. And 2mg  po bid prn diarrhea  . LUMIGAN 0.01 % SOLN Place 1 drop into both eyes at bedtime.   . Probiotic Product (ALIGN) 4 MG CAPS Take 1 capsule by mouth daily.  Marland Kitchen senna (SENOKOT) 8.6 MG tablet Take 1 tablet by mouth daily.   Facility-Administered Encounter Medications as of 10/08/2019  Medication  . methylPREDNISolone acetate (DEPO-MEDROL) injection 40 mg    Review of Systems  Constitutional: Negative for chills, fever and malaise/fatigue.  Respiratory: Negative for shortness of breath.   Cardiovascular: Negative for chest pain.  Gastrointestinal: Negative for abdominal pain.  Genitourinary: Negative for dysuria.  Musculoskeletal: Positive for falls and joint pain.  Neurological: Negative for dizziness and loss of consciousness.  Endo/Heme/Allergies: Bruises/bleeds easily.  Psychiatric/Behavioral: Positive for memory loss.    Immunization History  Administered Date(s) Administered  . DTaP 11/20/2012  . Influenza, High Dose Seasonal PF 03/22/2018  . Influenza-Unspecified 04/14/2011, 03/21/2014, 03/21/2017, 04/14/2017, 03/22/2018, 03/22/2018  . Pneumococcal Polysaccharide-23 09/09/2008, 01/09/2018  . Td 11/20/2012   Pertinent  Health Maintenance Due  Topic Date Due  . DEXA SCAN  Never done  . PNA vac Low Risk Adult (2 of 2 - PCV13) 01/10/2019  . INFLUENZA VACCINE  01/19/2020   Fall Risk  08/28/2019  Falls in the past year? 0  Number falls in past yr:  0  Injury with Fall? 0   Functional Status Survey:    Vitals:   10/08/19 1152  BP: 133/71  Pulse: 70  Temp: 98.2 F (36.8 C)  SpO2: 95%  Weight: 136 lb (61.7 kg)  Height: 5\' 7"  (1.702 m)   Body mass index is 21.3 kg/m. Physical Exam Vitals reviewed.  Constitutional:      General: She is not in acute distress.    Appearance: Normal appearance. She is not toxic-appearing.  HENT:     Head: Normocephalic and atraumatic.  Musculoskeletal:     Comments: Left knee remains a bit deformed, is only painful when she stands up on it and walks and all over  Skin:    General: Skin is warm and dry.  Neurological:     General: No focal deficit  present.     Mental Status: She is alert. Mental status is at baseline.  Psychiatric:        Mood and Affect: Mood normal.     Labs reviewed: Recent Labs    12/27/18 1039 01/07/19 0000 07/03/19 1142  NA 140  --  139  K 4.6  --  3.9  CL 100  --  104  CO2 32  --  28  GLUCOSE 89  --  109*  BUN 19 16 17   CREATININE 0.85  --  0.77  CALCIUM 9.6  --  9.0   Recent Labs    12/27/18 1039 07/03/19 1142  AST 22 20  ALT 14 15  ALKPHOS  --  89  BILITOT 0.6 0.4  PROT 6.4 6.5  ALBUMIN  --  3.6   Recent Labs    12/27/18 1039 07/03/19 1142  WBC 5.6 7.4  NEUTROABS 3,455 5.5  HGB 14.6 13.5  HCT 44.7 40.5  MCV 91.4 88.5  PLT 225 313.0   Lab Results  Component Value Date   TSH 1.71 07/03/2019   No results found for: HGBA1C No results found for: CHOL, HDL, LDLCALC, LDLDIRECT, TRIG, CHOLHDL  Significant Diagnostic Results in last 30 days:  No results found.  Assessment/Plan 1. Localized osteoarthritis of left knee -ordered voltaren gel 4g  qid to left knee and tylenol 500mg  po tid prn for her pain--she wrote down what I told her so she can refer back to it  Family/ staff Communication: discussed with AL nurse  Labs/tests ordered:  none  Yaremi Stahlman L. Tyiana Hill, D.O. [facility] [address], [place] [address] Cell Phone (Mon-Fri 8am-5pm):  [phone] On Call:  [phone] & follow prompts after 5pm & weekends Office Phone:  [phone] Office Fax:  [phone]

## 2019-10-08 NOTE — Telephone Encounter (Signed)
I am trying to see her today.  She was in so much pain that she went out to lunch and target with her neighbor.  I'm going to try to catch her at the end of the day.

## 2019-10-08 NOTE — Telephone Encounter (Addendum)
[facility] calling stating that pt is still complaining of left knee pain, pt was seen 09/03/2019 for left knee injection. please advise

## 2019-12-04 ENCOUNTER — Encounter: Payer: Self-pay | Admitting: Internal Medicine

## 2019-12-04 ENCOUNTER — Other Ambulatory Visit: Payer: Self-pay

## 2019-12-04 ENCOUNTER — Non-Acute Institutional Stay: Payer: Medicare Other | Admitting: Internal Medicine

## 2019-12-04 VITALS — BP 122/58 | HR 72 | Temp 97.7°F | Ht 67.0 in | Wt 138.0 lb

## 2019-12-04 DIAGNOSIS — M810 Age-related osteoporosis without current pathological fracture: Secondary | ICD-10-CM

## 2019-12-04 DIAGNOSIS — M1712 Unilateral primary osteoarthritis, left knee: Secondary | ICD-10-CM

## 2019-12-04 DIAGNOSIS — F0280 Dementia in other diseases classified elsewhere without behavioral disturbance: Secondary | ICD-10-CM | POA: Diagnosis not present

## 2019-12-04 DIAGNOSIS — M2042 Other hammer toe(s) (acquired), left foot: Secondary | ICD-10-CM | POA: Diagnosis not present

## 2019-12-04 DIAGNOSIS — M0609 Rheumatoid arthritis without rheumatoid factor, multiple sites: Secondary | ICD-10-CM | POA: Diagnosis not present

## 2019-12-04 DIAGNOSIS — G301 Alzheimer's disease with late onset: Secondary | ICD-10-CM | POA: Diagnosis not present

## 2019-12-04 DIAGNOSIS — K580 Irritable bowel syndrome with diarrhea: Secondary | ICD-10-CM

## 2019-12-04 DIAGNOSIS — F3341 Major depressive disorder, recurrent, in partial remission: Secondary | ICD-10-CM | POA: Diagnosis not present

## 2019-12-04 DIAGNOSIS — R2681 Unsteadiness on feet: Secondary | ICD-10-CM | POA: Diagnosis not present

## 2019-12-04 DIAGNOSIS — M2041 Other hammer toe(s) (acquired), right foot: Secondary | ICD-10-CM

## 2019-12-04 NOTE — Progress Notes (Signed)
Location:  Occupational psychologist of Service:  Clinic (12)  Provider: Justinian Miano L. Mariea Clonts, D.O., C.M.D.  Code Status: DNR Goals of Care:  Advanced Directives 12/04/2019  Does Patient Have a Medical Advance Directive? -  Type of Advance Directive Out of facility DNR (pink MOST or yellow form)  Does patient want to make changes to medical advance directive? No - Patient declined  Copy of [place] in Chart? Yes - validated most recent copy scanned in chart (See row information)  Would patient like information on creating a medical advance directive? -  Pre-existing out of facility DNR order (yellow form or pink MOST form) -   Chief Complaint  Patient presents with  . Medical Management of Chronic Issues    4 week follow up  on knee pain    HPI: Patient is a 86 y.o. female seen today for medical management of chronic diseases--f/u on left knee pain. She had a knee injection of depomedrol 3/16, then I saw her again on 4/20 in her apt at which time we wrote out a schedule of voltaren gel and tylenol for her to use.  She reports that her knee has been doing much better since using the voltaren gel.  She is not having pain with walking or at rest.  She also tells me about how she saw podiatry yesterday and had her callus on her hammertoes shaved down as well as her fungal nails and her feet feel a lot better also and it's easier to walk.  As far as her mood, she says she'd rather not be here and wishes she could driver her car and go places.  She is often seen participating in activities and walking around the facility when I'm rounding.  She still misses her husband.  She is sleeping well at night and her appetite is good.  She also enjoys her box garden where she tells me she is growing different types of lettuce and "other things" of which it sounds like she's forgotten what.  She loves being outside.   Past Medical History:  Diagnosis Date  . Anxiety  disorder   . Bacterial overgrowth syndrome   . Breast cancer, stage 1 [facility])    '97/recurrent '05 right breast(s/p mastectomy)  . Bunion   . Colon polyps   . Depression   . Diverticulitis   . GERD (gastroesophageal reflux disease)   . Glaucoma   . Hiatal hernia   . History of TIAs    - 13 yrs ago"brief memory loss"  . Hypertension   . IBS (irritable bowel syndrome)   . Insomnia   . Irritable bowel syndrome   . Osteoporosis   . Ovarian cyst     Past Surgical History:  Procedure Laterality Date  . ABDOMINAL HYSTERECTOMY    . ANTERIOR APPROACH HEMI HIP ARTHROPLASTY Right 03/22/2017   Procedure: ANTERIOR APPROACH HEMI HIP ARTHROPLASTY;  Surgeon: Rod Can, MD;  Location: [place];  Service: Orthopedics;  Laterality: Right;  . BREAST BIOPSY     right  . BUNIONECTOMY Bilateral    both, repeat x1  . CATARACT EXTRACTION Left   . KNEE ARTHROPLASTY    . MASTECTOMY  1997   Right  . TONSILLECTOMY    . TOTAL KNEE ARTHROPLASTY Right 04/15/2013   Procedure: RIGHT TOTAL KNEE ARTHROPLASTY;  Surgeon: Gearlean Alf, MD;  [facility];  Service: Orthopedics;  Laterality: Right;    Allergies  Allergen Reactions  .  Amoxicillin Hives  . Latex Itching    Outpatient Encounter Medications as of 12/04/2019  Medication Sig  . alendronate (FOSAMAX) 70 MG tablet Take 70 mg by mouth once a week. Take with a full glass of water on an empty stomach.  . ALPHAGAN P 0.1 % SOLN Apply 1 drop to eye See admin instructions. Into right eye twice daily.  Marland Kitchen amLODipine (NORVASC) 2.5 MG tablet Take 1 tablet by mouth daily.  Marland Kitchen aspirin EC 81 MG tablet Take 81 mg by mouth daily.  . Cholecalciferol (VITAMIN D3) 50 MCG (2000 UT) capsule Take 1 capsule (2,000 Units total) by mouth daily.  Marland Kitchen dicyclomine (BENTYL) 10 MG capsule Take 1 capsule (10 mg total) by mouth 4 (four) times daily -  before meals and at bedtime.  . dorzolamide-timolol (COSOPT) 22.3-6.8 MG/ML ophthalmic solution Place 1 drop into both eyes  2 (two) times daily.   Marland Kitchen escitalopram (LEXAPRO) 20 MG tablet Take 1 tablet (20 mg total) by mouth daily.  Marland Kitchen guaiFENesin (MUCINEX) 600 MG 12 hr tablet Take 600 mg by mouth daily.  . hydroxychloroquine (PLAQUENIL) 200 MG tablet TAKE 1 TABLET TWICE A DAY ON MONDAY THROUGH FRIDAY (RHEUMATOID ARTHRITIS)  . loperamide (IMODIUM) 2 MG capsule   . loperamide (LOPERAMIDE A-D) 2 MG tablet Take 2 mg by mouth in the morning and at bedtime. And 2mg  po bid prn diarrhea  . LUMIGAN 0.01 % SOLN Place 1 drop into both eyes at bedtime.   . Probiotic Product (ALIGN) 4 MG CAPS Take 1 capsule by mouth daily.  Marland Kitchen senna (SENOKOT) 8.6 MG tablet Take 1 tablet by mouth daily.   Facility-Administered Encounter Medications as of 12/04/2019  Medication  . methylPREDNISolone acetate (DEPO-MEDROL) injection 40 mg    Review of Systems:  Review of Systems  Constitutional: Positive for malaise/fatigue. Negative for chills and fever.  HENT: Positive for hearing loss.   Eyes:       Glasses  Respiratory: Negative for cough and shortness of breath.   Cardiovascular: Negative for chest pain and palpitations.  Gastrointestinal: Negative for abdominal pain, blood in stool, constipation, diarrhea and melena.  Genitourinary: Negative for dysuria.  Musculoskeletal: Positive for joint pain.       Denies falls  Skin: Negative for itching and rash.  Neurological: Negative for dizziness, loss of consciousness and weakness.  Endo/Heme/Allergies: Bruises/bleeds easily.  Psychiatric/Behavioral: Positive for depression and memory loss. Negative for hallucinations and suicidal ideas. The patient is nervous/anxious. The patient does not have insomnia.     Health Maintenance  Topic Date Due  . DEXA SCAN  Never done  . PNA vac Low Risk Adult (2 of 2 - PCV13) 01/10/2019  . INFLUENZA VACCINE  01/19/2020  . TETANUS/TDAP  11/21/2022  . COVID-19 Vaccine  Completed    Physical Exam: Vitals:   12/04/19 1128  BP: (!) 122/58  Pulse: 72   Temp: 97.7 F (36.5 C)  TempSrc: Temporal  SpO2: 96%  Weight: 138 lb (62.6 kg)  Height: 5\' 7"  (1.702 m)   Body mass index is 21.61 kg/m. Physical Exam Constitutional:      General: She is not in acute distress.    Appearance: She is not ill-appearing or toxic-appearing.  HENT:     Head: Normocephalic and atraumatic.  Eyes:     Comments: glasses  Cardiovascular:     Rate and Rhythm: Normal rate and regular rhythm.     Pulses: Normal pulses.     Heart sounds: Normal heart sounds.  Pulmonary:     Effort: Pulmonary effort is normal.     Breath sounds: Normal breath sounds. No wheezing, rhonchi or rales.  Abdominal:     General: Bowel sounds are normal.     Palpations: Abdomen is soft.  Musculoskeletal:        General: Normal range of motion.     Right lower leg: No edema.     Left lower leg: No edema.     Comments: No tenderness of knee; walking with her walker  Skin:    General: Skin is warm and dry.     Capillary Refill: Capillary refill takes less than 2 seconds.     Comments: Left foot with hammertoe with callus removed now, fungal nails  Neurological:     General: No focal deficit present.     Mental Status: She is alert.     Motor: No weakness.     Gait: Gait abnormal.     Comments: Oriented to person and place though has gotten lost with her friend in the facility at least once  Psychiatric:        Mood and Affect: Mood normal.     Comments: Poor eye contact with me today--staring straight ahead while talking, a bit tearful when talking about her husband's death several years ago      Labs reviewed: Basic Metabolic Panel: Recent Labs    12/27/18 1039 01/07/19 0000 07/03/19 1142  NA 140  --  139  K 4.6  --  3.9  CL 100  --  104  CO2 32  --  28  GLUCOSE 89  --  109*  BUN 19 16 17   CREATININE 0.85  --  0.77  CALCIUM 9.6  --  9.0  TSH  --  1.10 1.71   Liver Function Tests: Recent Labs    12/27/18 1039 07/03/19 1142  AST 22 20  ALT 14 15   ALKPHOS  --  89  BILITOT 0.6 0.4  PROT 6.4 6.5  ALBUMIN  --  3.6   No results for input(s): LIPASE, AMYLASE in the last 8760 hours. No results for input(s): AMMONIA in the last 8760 hours. CBC: Recent Labs    12/27/18 1039 07/03/19 1142  WBC 5.6 7.4  NEUTROABS 3,455 5.5  HGB 14.6 13.5  HCT 44.7 40.5  MCV 91.4 88.5  PLT 225 313.0   Lipid Panel: No results for input(s): CHOL, HDL, LDLCALC, TRIG, CHOLHDL, LDLDIRECT in the last 8760 hours. No results found for: HGBA1C  Procedures since last visit: No results found.  Assessment/Plan 1. Localized osteoarthritis of left knee -reports better with voltaren gel; continue tylenol and voltaren gel regimen, walker use to stabilize  2. Late onset Alzheimer's disease without behavioral disturbance (HCC) -moderate stage, cont AL support though she appears she'd meet criteria for a higher level functioning memory care resident  3. Acquired hammertoes of both feet -s/p removal of calluses with improved pain in feet after than and nail trimming  4. Unsteady gait -cont walker at all times   5. Senile osteoporosis -cont fosamax weekly and D3 supplement, walking with walker for weightbearing exercise and exercise classes  6. Rheumatoid arthritis, seronegative, multiple sites ([place]) -continues on plaquenil through rheum and regular eye exams due to med, f/u labs before I see her next in 4 mos  7. Irritable bowel syndrome with diarrhea -did not c/o this today--cont bid loperamide, prn bentyl (is anticholinergic which can counteract the acetylcholine she needs to help her cognition,  but also helping abdominal pain apparently)  8. Recurrent major depression in partial remission ([place]) -cont lexapro therapy -seems to be doing ok when kept occupied with activities so will not change meds  Labs/tests ordered: cbc, cmp before Next appt:  04/08/2020  Maxmilian Trostel L. Hildreth Robart, D.O. [facility] [address], [place] [address] Cell Phone (Mon-Fri 8am-5pm):  [phone] On Call:  [phone] & follow prompts after 5pm & weekends Office Phone:  [phone] Office Fax:  [phone]

## 2019-12-12 ENCOUNTER — Ambulatory Visit: Payer: Medicare Other | Admitting: Podiatry

## 2019-12-13 ENCOUNTER — Telehealth: Payer: Self-pay

## 2019-12-13 NOTE — Telephone Encounter (Signed)
Opened in error

## 2019-12-13 NOTE — Progress Notes (Signed)
Office Visit Note  Patient: Dawn Diaz             Date of Birth: [birth year]           MRN: [id number]             PCP: Gayland Curry, DO Referring: Shon Baton, MD Visit Date: 12/20/2019 Occupation: @GUAROCC @  Subjective:  Medication monitoring   History of Present Illness: Dawn Diaz is a 86 y.o. female history of rheumatoid arthritis and osteoarthritis.  She states that she is not having any discomfort.  She is not sure if she is taking hydroxychloroquine daily.  Although it is listed in her medication list.  She is very confused and it was difficult to obtain history from her.  Activities of Daily Living:  Patient reports morning stiffness for a few  minutes.   Patient Denies nocturnal pain.  Difficulty dressing/grooming: Denies Difficulty climbing stairs: Denies Difficulty getting out of chair: Reports Difficulty using hands for taps, buttons, cutlery, and/or writing: Denies  Review of Systems  Constitutional: Negative for fatigue.  HENT: Positive for mouth dryness. Negative for mouth sores and nose dryness.   Eyes: Negative for pain, itching and dryness.  Respiratory: Negative for shortness of breath and difficulty breathing.   Cardiovascular: Negative for chest pain and palpitations.  Gastrointestinal: Positive for diarrhea. Negative for blood in stool and constipation.  Endocrine: Negative for increased urination.  Genitourinary: Negative for difficulty urinating.  Musculoskeletal: Positive for morning stiffness. Negative for arthralgias, joint pain, joint swelling, myalgias, muscle tenderness and myalgias.  Skin: Negative for color change, rash and redness.  Allergic/Immunologic: Negative for susceptible to infections.  Neurological: Positive for memory loss. Negative for dizziness, numbness, headaches and weakness.  Hematological: Positive for bruising/bleeding tendency.  Psychiatric/Behavioral: Positive for confusion. Negative for sleep disturbance.     PMFS History:  Patient Active Problem List   Diagnosis Date Noted  . Adult failure to thrive 09/13/2019  . Immune system disorder ([place]) 09/13/2019  . Depression, major, single episode, complete remission ([place]) 09/13/2019  . Bronchiectasis ([place]) 09/13/2019  . 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 Dysphagia 09/13/2019  . Red eye 09/13/2019  . Senile osteoporosis 08/28/2019  . Unsteady gait 08/28/2019  . Insomnia 08/23/2019  . Anxiety and depression 08/23/2019  . Diverticulosis 08/23/2019  . Thyroiditis 08/23/2019  . Colitis 08/23/2019  . Allergic rhinitis 08/23/2019  . Depression, major, single episode, moderate ([place]) 03/28/2017  . Pulmonary nodule seen on imaging study 03/28/2017  . Acquired leg length discrepancy 03/28/2017  . Closed displaced fracture of right femoral neck ([place]) 03/22/2017  . Adenocarcinoma of breast ([place])   . Rheumatoid arthritis, seronegative, multiple sites ([place]) 11/15/2016  . Primary osteoarthritis of both hands 11/15/2016  . High risk medications (not anticoagulants) long-term use 11/15/2016  . Family history of malignant neoplasm of gastrointestinal tract 12/25/2013  . Constipation 04/24/2013  . Glaucoma 04/24/2013  . OA (osteoarthritis) of knee 04/15/2013  . History of breast cancer in female 12/20/2010  . DIVERTICULOSIS-COLON 08/06/2009  . IRRITABLE BOWEL SYNDROME 08/06/2009    Past Medical History:  Diagnosis Date  . Anxiety disorder   . Bacterial overgrowth syndrome   . Breast cancer, stage 1 [facility])    '97/recurrent '05 right breast(s/p mastectomy)  . Bunion   . Colon polyps   . Depression   . Diverticulitis   . GERD (gastroesophageal reflux disease)   . Glaucoma   . Hiatal hernia   . History of TIAs    -  13 yrs ago"brief memory loss"  . Hypertension   . IBS (irritable bowel syndrome)   . Insomnia   . Irritable bowel syndrome   . Osteoporosis   . Ovarian cyst     Family History  Problem Relation Age of Onset  . Colon  cancer Sister 77       Died at 54  . Kidney cancer Father 23  . Pancreatic cancer Mother 17  . Pancreatic cancer Maternal Grandmother 63  . Cancer Paternal Grandfather        died from "abdominal ca" at young age  . Breast cancer Neg Hx   . Stomach cancer Neg Hx   . Esophageal cancer Neg Hx    Past Surgical History:  Procedure Laterality Date  . ABDOMINAL HYSTERECTOMY    . ANTERIOR APPROACH HEMI HIP ARTHROPLASTY Right 03/22/2017   Procedure: ANTERIOR APPROACH HEMI HIP ARTHROPLASTY;  Surgeon: Rod Can, MD;  Location: [facility];  Service: Orthopedics;  Laterality: Right;  . BREAST BIOPSY     right  . BUNIONECTOMY Bilateral    both, repeat x1  . CATARACT EXTRACTION Left   . KNEE ARTHROPLASTY    . MASTECTOMY  1997   Right  . TONSILLECTOMY    . TOTAL KNEE ARTHROPLASTY Right 04/15/2013   Procedure: RIGHT TOTAL KNEE ARTHROPLASTY;  Surgeon: Gearlean Alf, MD;  Location: [facility];  Service: Orthopedics;  Laterality: Right;   Social History   Social History Narrative  . Not on file   Immunization History  Administered Date(s) Administered  . DTaP 11/20/2012  . Influenza, High Dose Seasonal PF 03/22/2018  . Influenza-Unspecified 04/14/2011, 03/21/2014, 03/21/2017, 04/14/2017, 03/22/2018, 03/22/2018  . Moderna SARS-COVID-2 Vaccination 06/27/2019, 07/30/2019  . Pneumococcal Polysaccharide-23 09/09/2008, 01/09/2018  . Td 11/20/2012     Objective: Vital Signs: BP (!) 167/73 (BP Location: Left Arm, Patient Position: Sitting, Cuff Size: Normal)   Pulse 63   Resp 14   Ht 5\' 7"  (1.702 m)   Wt 137 lb 6.4 oz (62.3 kg)   BMI 21.52 kg/m    Physical Exam Vitals and nursing note reviewed.  Constitutional:      Appearance: She is well-developed.  HENT:     Head: Normocephalic and atraumatic.  Eyes:     Conjunctiva/sclera: Conjunctivae normal.  Cardiovascular:     Rate and Rhythm: Normal rate and regular rhythm.     Heart sounds: Normal heart sounds.  Pulmonary:     Effort:  Pulmonary effort is normal.     Breath sounds: Normal breath sounds.  Abdominal:     General: Bowel sounds are normal.     Palpations: Abdomen is soft.  Musculoskeletal:     Cervical back: Normal range of motion.     Right lower leg: Edema present.     Left lower leg: Edema present.  Lymphadenopathy:     Cervical: No cervical adenopathy.  Skin:    General: Skin is warm and dry.     Capillary Refill: Capillary refill takes less than 2 seconds.  Neurological:     Mental Status: She is alert and oriented to person, place, and time.  Psychiatric:        Behavior: Behavior normal.      Musculoskeletal Exam: C-spine with good range of motion.  Shoulder joints, elbow joints, wrist joints, MCPs and PIPs and DIPs with good range of motion.  She has some MCP thickening but no synovitis was noted.  DIP and PIP thickening was noted.  Hip joints, knee  joints, ankles with good range of motion.  She had bilateral pedal edema.  There was no tenderness over MTPs.  CDAI Exam: CDAI Score: 0.2  Patient Global: 1 mm; Provider Global: 1 mm Swollen: 0 ; Tender: 0  Joint Exam 12/20/2019   No joint exam has been documented for this visit   There is currently no information documented on the homunculus. Go to the Rheumatology activity and complete the homunculus joint exam.  Investigation: No additional findings.  Imaging: No results found.  Recent Labs: Lab Results  Component Value Date   WBC 7.4 07/03/2019   HGB 13.5 07/03/2019   PLT 313.0 07/03/2019   NA 139 07/03/2019   K 3.9 07/03/2019   CL 104 07/03/2019   CO2 28 07/03/2019   GLUCOSE 109 (H) 07/03/2019   BUN 17 07/03/2019   CREATININE 0.77 07/03/2019   BILITOT 0.4 07/03/2019   ALKPHOS 89 07/03/2019   AST 20 07/03/2019   ALT 15 07/03/2019   PROT 6.5 07/03/2019   ALBUMIN 3.6 07/03/2019   CALCIUM 9.0 07/03/2019   GFRAA 70 12/27/2018    Speciality Comments: PLQ Eye Exam: 02/08/18 WNL @ [facility] Follow up in 1  year  Procedures:  No procedures performed Allergies: Amoxicillin and Latex   Assessment / Plan:     Visit Diagnoses: Rheumatoid arthritis of multiple sites with negative rheumatoid factor (HCC)-patient had no synovitis on examination.  She is not sure why she is taking Plaquenil.  She is also not sure if she is taking Plaquenil.  I do not see any synovitis on the examination.  I also discussed with her that it will be her choice to come off hydroxychloroquine if she wishes to.  As long as she is taking the medication she will get labs every 6 months which will include CBC and CMP.  She will need eye examination to monitor for ocular toxicity once a year.  High risk medication use - Plaquenil 200 mg 1 tablet by mouth daily? PLQ Eye Exam: 02/08/18 - Plan: CBC with Differential/Platelet, COMPLETE METABOLIC PANEL WITH GFR.  Her eye examination is due.  She states that she will get it scheduled through the facility.  Primary osteoarthritis of both hands-DIP and PIP thickening was noted.  History of total right hip replacement-doing well.  History of total right knee replacement-she had no discomfort on palpation.  DDD (degenerative disc disease), cervical-she has fairly good range of motion.  DDD (degenerative disc disease), lumbar-she denied pain.  Osteopenia of multiple sites  History of diverticulosis  History of scoliosis  History of breast cancer  History of vitamin D deficiency  History of thyroiditis  History of IBS  Orders: Orders Placed This Encounter  Procedures  . CBC with Differential/Platelet  . COMPLETE METABOLIC PANEL WITH GFR   No orders of the defined types were placed in this encounter.     Follow-Up Instructions: Return in about 1 year (around 12/19/2020) for Rheumatoid arthritis, Osteoarthritis.   Bo Merino, MD  Note - This record has been created using Editor, commissioning.  Chart creation errors have been sought, but may not always  have been  located. Such creation errors do not reflect on  the standard of medical care.

## 2019-12-20 ENCOUNTER — Ambulatory Visit (INDEPENDENT_AMBULATORY_CARE_PROVIDER_SITE_OTHER): Payer: Medicare Other | Admitting: Rheumatology

## 2019-12-20 ENCOUNTER — Other Ambulatory Visit: Payer: Self-pay

## 2019-12-20 ENCOUNTER — Encounter: Payer: Self-pay | Admitting: Physician Assistant

## 2019-12-20 ENCOUNTER — Telehealth: Payer: Self-pay | Admitting: *Deleted

## 2019-12-20 VITALS — BP 167/73 | HR 63 | Resp 14 | Ht 67.0 in | Wt 137.4 lb

## 2019-12-20 DIAGNOSIS — M19041 Primary osteoarthritis, right hand: Secondary | ICD-10-CM | POA: Diagnosis not present

## 2019-12-20 DIAGNOSIS — Z853 Personal history of malignant neoplasm of breast: Secondary | ICD-10-CM

## 2019-12-20 DIAGNOSIS — Z79899 Other long term (current) drug therapy: Secondary | ICD-10-CM | POA: Diagnosis not present

## 2019-12-20 DIAGNOSIS — M0609 Rheumatoid arthritis without rheumatoid factor, multiple sites: Secondary | ICD-10-CM

## 2019-12-20 DIAGNOSIS — Z96641 Presence of right artificial hip joint: Secondary | ICD-10-CM

## 2019-12-20 DIAGNOSIS — Z96651 Presence of right artificial knee joint: Secondary | ICD-10-CM

## 2019-12-20 DIAGNOSIS — Z8739 Personal history of other diseases of the musculoskeletal system and connective tissue: Secondary | ICD-10-CM

## 2019-12-20 DIAGNOSIS — M19042 Primary osteoarthritis, left hand: Secondary | ICD-10-CM

## 2019-12-20 DIAGNOSIS — M5030 Other cervical disc degeneration, unspecified cervical region: Secondary | ICD-10-CM

## 2019-12-20 DIAGNOSIS — Z8639 Personal history of other endocrine, nutritional and metabolic disease: Secondary | ICD-10-CM

## 2019-12-20 DIAGNOSIS — Z8719 Personal history of other diseases of the digestive system: Secondary | ICD-10-CM

## 2019-12-20 DIAGNOSIS — M8589 Other specified disorders of bone density and structure, multiple sites: Secondary | ICD-10-CM

## 2019-12-20 DIAGNOSIS — M5136 Other intervertebral disc degeneration, lumbar region: Secondary | ICD-10-CM

## 2019-12-20 LAB — CBC WITH DIFFERENTIAL/PLATELET
Absolute Monocytes: 530 cells/uL (ref 200–950)
Basophils Absolute: 68 cells/uL (ref 0–200)
Basophils Relative: 1.2 %
Eosinophils Absolute: 188 cells/uL (ref 15–500)
Eosinophils Relative: 3.3 %
HCT: 44.2 % (ref 35.0–45.0)
Hemoglobin: 14.7 g/dL (ref 11.7–15.5)
Lymphs Abs: 1197 cells/uL (ref 850–3900)
MCH: 29.3 pg (ref 27.0–33.0)
MCHC: 33.3 g/dL (ref 32.0–36.0)
MCV: 88 fL (ref 80.0–100.0)
MPV: 10.7 fL (ref 7.5–12.5)
Monocytes Relative: 9.3 %
Neutro Abs: 3716 cells/uL (ref 1500–7800)
Neutrophils Relative %: 65.2 %
Platelets: 240 10*3/uL (ref 140–400)
RBC: 5.02 10*6/uL (ref 3.80–5.10)
RDW: 12.7 % (ref 11.0–15.0)
Total Lymphocyte: 21 %
WBC: 5.7 10*3/uL (ref 3.8–10.8)

## 2019-12-20 LAB — COMPLETE METABOLIC PANEL WITH GFR
AG Ratio: 1.3 (calc) (ref 1.0–2.5)
ALT: 16 U/L (ref 6–29)
AST: 22 U/L (ref 10–35)
Albumin: 3.9 g/dL (ref 3.6–5.1)
Alkaline phosphatase (APISO): 81 U/L (ref 37–153)
BUN: 23 mg/dL (ref 7–25)
CO2: 30 mmol/L (ref 20–32)
Calcium: 9.3 mg/dL (ref 8.6–10.4)
Chloride: 101 mmol/L (ref 98–110)
Creat: 0.79 mg/dL (ref 0.60–0.88)
GFR, Est African American: 76 mL/min/{1.73_m2} (ref >=60)
GFR, Est Non African American: 66 mL/min/{1.73_m2} (ref >=60)
Globulin: 2.9 g/dL (calc) (ref 1.9–3.7)
Glucose, Bld: 90 mg/dL (ref 65–99)
Potassium: 4.2 mmol/L (ref 3.5–5.3)
Sodium: 139 mmol/L (ref 135–146)
Total Bilirubin: 0.5 mg/dL (ref 0.2–1.2)
Total Protein: 6.8 g/dL (ref 6.1–8.1)

## 2019-12-20 NOTE — Telephone Encounter (Signed)
Dr. Estanislado Pandy would like the patient to reduce plaquenil to 200 mg 1 tablet by mouth daily.

## 2019-12-20 NOTE — Telephone Encounter (Signed)
Patient was in the office today. Patient is living at [facility] and paperwork was filled out and sent back with her. Per Rollene Fare the nurse working with patient, the paperwork states patient is to continue PLQ one tablet by mouth daily.   Rollene Fare states the patient has been taking PLQ BID Monday-Friday. She would like to know if you want to change the way the patient is taking. Please advise.

## 2019-12-20 NOTE — Telephone Encounter (Signed)
Called Regina back and advised  Dr. Estanislado Pandy does want Ms. Fairclough to reduce plaquenil to 200 mg 1 tablet by mouth daily.

## 2019-12-22 NOTE — Progress Notes (Signed)
CBC and CMP are normal.

## 2020-01-14 ENCOUNTER — Non-Acute Institutional Stay: Payer: Medicare Other | Admitting: Internal Medicine

## 2020-01-14 ENCOUNTER — Encounter: Payer: Self-pay | Admitting: Internal Medicine

## 2020-01-14 DIAGNOSIS — Z9189 Other specified personal risk factors, not elsewhere classified: Secondary | ICD-10-CM | POA: Diagnosis not present

## 2020-01-14 DIAGNOSIS — G301 Alzheimer's disease with late onset: Secondary | ICD-10-CM

## 2020-01-14 DIAGNOSIS — F0281 Dementia in other diseases classified elsewhere with behavioral disturbance: Secondary | ICD-10-CM | POA: Diagnosis not present

## 2020-01-14 DIAGNOSIS — F02818 Dementia in other diseases classified elsewhere, unspecified severity, with other behavioral disturbance: Secondary | ICD-10-CM

## 2020-01-14 DIAGNOSIS — R2681 Unsteadiness on feet: Secondary | ICD-10-CM

## 2020-01-14 NOTE — Progress Notes (Signed)
Location:  [facility] Room Number: [phone] Place of Service:  ALF (13) Provider:  Witt Plitt L. Mariea Clonts, D.O., C.M.D.  Gayland Curry, DO  Patient Care Team: Gayland Curry, DO as PCP - General (Geriatric Medicine) Ladene Artist, MD (Gastroenterology)  Extended Emergency Contact Information Primary Emergency Contact: [place] Phone: [phone] Mobile Phone: [phone] Relation: Daughter  Code Status:  DNR Goals of care: Advanced Directive information Advanced Directives 01/14/2020  Does Patient Have a Medical Advance Directive? Yes  Type of Advance Directive Out of facility DNR (pink MOST or yellow form)  Does patient want to make changes to medical advance directive? No - Patient declined  Copy of [place] in Chart? -  Would patient like information on creating a medical advance directive? -  Pre-existing out of facility DNR order (yellow form or pink MOST form) -     Chief Complaint  Patient presents with  . Acute Visit    Dementia behaviors     HPI:  Pt is a 86 y.o. female seen today for an acute visit for family request for medication to manage her dementia behaviors.    Dawn Diaz has had several recent incidents in AL of concern:  Took water pitcher off nursing med cart and put flowers in it, Barging into other residents' apts, Put hands into the buffet food, Rearranging silverware and napkins  Walking in the lobby in pull-up and bra only where all other residents, visitors and staff may see her Dawn Diaz is persistent and aggressive in the moment and has no boundaries or respect for other residents, staff Has increased fall risk  Nursing provided excellent documentation of the above to me for the visit today.    Past Medical History:  Diagnosis Date  . Anxiety disorder   . Bacterial overgrowth syndrome   . Breast cancer, stage 1 [facility])    '97/recurrent '05 right  breast(s/p mastectomy)  . Bunion   . Colon polyps   . Depression   . Diverticulitis   . GERD (gastroesophageal reflux disease)   . Glaucoma   . Hiatal hernia   . History of TIAs    - 13 yrs ago"brief memory loss"  . Hypertension   . IBS (irritable bowel syndrome)   . Insomnia   . Irritable bowel syndrome   . Osteoporosis   . Ovarian cyst    Past Surgical History:  Procedure Laterality Date  . ABDOMINAL HYSTERECTOMY    . ANTERIOR APPROACH HEMI HIP ARTHROPLASTY Right 03/22/2017   Procedure: ANTERIOR APPROACH HEMI HIP ARTHROPLASTY;  Surgeon: Rod Can, MD;  Location: [facility];  Service: Orthopedics;  Laterality: Right;  . BREAST BIOPSY     right  . BUNIONECTOMY Bilateral    both, repeat x1  . CATARACT EXTRACTION Left   . KNEE ARTHROPLASTY    . MASTECTOMY  1997   Right  . TONSILLECTOMY    . TOTAL KNEE ARTHROPLASTY Right 04/15/2013   Procedure: RIGHT TOTAL KNEE ARTHROPLASTY;  Surgeon: Gearlean Alf, MD;  [facility];  Service: Orthopedics;  Laterality: Right;    Allergies  Allergen Reactions  . Amoxicillin Hives  . Latex Itching    Outpatient Encounter Medications as of 01/14/2020  Medication Sig  . acetaminophen (TYLENOL) 500 MG tablet Take 500 mg by mouth every 6 (six) hours as needed.  Marland Kitchen alendronate (FOSAMAX) 70 MG tablet Take 70 mg by mouth once a week. Take with a full  glass of water on an empty stomach.  . ALPHAGAN P 0.1 % SOLN Apply 1 drop to eye See admin instructions. Into right eye twice daily.  Marland Kitchen amLODipine (NORVASC) 2.5 MG tablet Take 1 tablet by mouth daily.  Marland Kitchen aspirin EC 81 MG tablet Take 81 mg by mouth daily.  . Cholecalciferol (VITAMIN D) 125 MCG (5000 UT) CAPS Take 5,000 Units by mouth daily.  . diclofenac Sodium (VOLTAREN) 1 % GEL Apply topically 4 (four) times daily.  Marland Kitchen dicyclomine (BENTYL) 10 MG capsule Take 1 capsule (10 mg total) by mouth 4 (four) times daily -  before meals and at bedtime.  . dorzolamide-timolol (COSOPT) 22.3-6.8 MG/ML  ophthalmic solution Place 1 drop into both eyes 2 (two) times daily.   Marland Kitchen escitalopram (LEXAPRO) 20 MG tablet Take 1 tablet (20 mg total) by mouth daily.  Marland Kitchen guaiFENesin (MUCINEX) 600 MG 12 hr tablet Take 600 mg by mouth daily.  . hydroxychloroquine (PLAQUENIL) 200 MG tablet Take by mouth daily.  Marland Kitchen LUMIGAN 0.01 % SOLN Place 1 drop into both eyes at bedtime.   . Probiotic Product (ALIGN) 4 MG CAPS Take 1 capsule by mouth daily.  Marland Kitchen senna (SENOKOT) 8.6 MG tablet Take 1 tablet by mouth daily.  Marland Kitchen loperamide (LOPERAMIDE A-D) 2 MG tablet Take 2 mg by mouth in the morning and at bedtime. And 2mg  po bid prn diarrhea  . [DISCONTINUED] hydroxychloroquine (PLAQUENIL) 200 MG tablet TAKE 1 TABLET TWICE A DAY ON MONDAY THROUGH FRIDAY (RHEUMATOID ARTHRITIS)   No facility-administered encounter medications on file as of 01/14/2020.    Review of Systems  Constitutional: Negative for chills, fever and malaise/fatigue.  HENT: Negative for congestion.   Eyes: Negative for blurred vision.       Glasses  Respiratory: Negative for shortness of breath.   Cardiovascular: Negative for chest pain and palpitations.  Gastrointestinal: Negative for abdominal pain and constipation.  Genitourinary: Negative for dysuria.  Musculoskeletal: Positive for falls and joint pain.  Skin: Negative for itching and rash.  Neurological: Negative for dizziness and loss of consciousness.  Psychiatric/Behavioral: Positive for memory loss. Negative for depression, hallucinations and suicidal ideas. The patient is not nervous/anxious and does not have insomnia.     Immunization History  Administered Date(s) Administered  . DTaP 11/20/2012  . Influenza, High Dose Seasonal PF 03/22/2018  . Influenza-Unspecified 04/14/2011, 03/21/2014, 03/21/2017, 04/14/2017, 03/22/2018, 03/22/2018  . Moderna SARS-COVID-2 Vaccination 06/27/2019, 07/30/2019  . Pneumococcal Polysaccharide-23 09/09/2008, 01/09/2018  . Td 11/20/2012   Pertinent  Health  Maintenance Due  Topic Date Due  . DEXA SCAN  Never done  . PNA vac Low Risk Adult (2 of 2 - PCV13) 01/10/2019  . INFLUENZA VACCINE  01/19/2020   Fall Risk  12/04/2019 08/28/2019  Falls in the past year? 0 0  Number falls in past yr: 0 0  Injury with Fall? 0 0      Vitals:   01/14/20 1645  BP: (!) 146/72  Pulse: 77  Temp: (!) 97.2 F (36.2 C)  SpO2: 94%  Weight: 137 lb (62.1 kg)  Height: 5\' 7"  (1.702 m)   Body mass index is 21.46 kg/m. Physical Exam Vitals reviewed.  Constitutional:      Appearance: Normal appearance.  HENT:     Head: Normocephalic and atraumatic.  Cardiovascular:     Rate and Rhythm: Normal rate and regular rhythm.     Pulses: Normal pulses.     Heart sounds: Normal heart sounds.  Pulmonary:  Effort: Pulmonary effort is normal.     Breath sounds: Normal breath sounds.  Abdominal:     General: Bowel sounds are normal.  Musculoskeletal:        General: Normal range of motion.     Right lower leg: Edema present.     Left lower leg: Edema present.     Comments: Mild nonpitting edema bilateral feet  Skin:    General: Skin is warm and dry.  Neurological:     General: No focal deficit present.     Mental Status: Dawn Diaz is alert.     Cranial Nerves: No cranial nerve deficit.     Gait: Gait abnormal.  Psychiatric:        Mood and Affect: Mood normal.     Comments: Forgets her cane or walker often     Labs reviewed: Recent Labs    07/03/19 1142 12/20/19 0952  NA 139 139  K 3.9 4.2  CL 104 101  CO2 28 30  GLUCOSE 109* 90  BUN 17 23  CREATININE 0.77 0.79  CALCIUM 9.0 9.3   Recent Labs    07/03/19 1142 12/20/19 0952  AST 20 22  ALT 15 16  ALKPHOS 89  --   BILITOT 0.4 0.5  PROT 6.5 6.8  ALBUMIN 3.6  --    Recent Labs    07/03/19 1142 12/20/19 0952  WBC 7.4 5.7  NEUTROABS 5.5 3,716  HGB 13.5 14.7  HCT 40.5 44.2  MCV 88.5 88.0  PLT 313.0 240   Lab Results  Component Value Date   TSH 1.71 07/03/2019   No results found  for: HGBA1C No results found for: CHOL, HDL, LDLCALC, LDLDIRECT, TRIG, CHOLHDL   Assessment/Plan 1. Late onset Alzheimer's disease with behavioral disturbance ([place]) -the behaviors Dawn Diaz is having are normal and expected in AD -it appears Dawn Diaz needs a higher level of care in memory care to address these or regular caregivers with her in the AL environment -medications will not prevent her from doing the items mentioned and should be a last resort regardless b/c of increasing fall risk  2. At risk for compromise of dignity -episode with walking around in underwear alone is a dignity issue for her in the AL setting where others are aware that this behavior is not appropriate -again, would benefit most from memory care now  3. Unsteady gait -has been an issue since Dawn Diaz arrived with frequent falls, does not remember to use assistive devices, has leg length discrepancy, knee OA and foot deformities all contributing, also on bentyl for abdominal distress which contributes to risk  Family/ staff Communication: discussed with evening AL nursing staff and med techs who were in agreement with above  Labs/tests ordered:  No new needed at this time  Kiko Ripp L. Araminta Zorn, D.O. [facility] [address], [place] [address] Cell Phone (Mon-Fri 8am-5pm):  [phone] On Call:  [phone] & follow prompts after 5pm & weekends Office Phone:  [phone] Office Fax:  [phone]

## 2020-01-19 DIAGNOSIS — F0281 Dementia in other diseases classified elsewhere with behavioral disturbance: Secondary | ICD-10-CM | POA: Insufficient documentation

## 2020-01-19 DIAGNOSIS — F02818 Dementia in other diseases classified elsewhere, unspecified severity, with other behavioral disturbance: Secondary | ICD-10-CM | POA: Insufficient documentation

## 2020-02-03 DIAGNOSIS — Z9189 Other specified personal risk factors, not elsewhere classified: Secondary | ICD-10-CM | POA: Diagnosis not present

## 2020-02-03 DIAGNOSIS — Z20828 Contact with and (suspected) exposure to other viral communicable diseases: Secondary | ICD-10-CM | POA: Diagnosis not present

## 2020-02-10 DIAGNOSIS — Z20828 Contact with and (suspected) exposure to other viral communicable diseases: Secondary | ICD-10-CM | POA: Diagnosis not present

## 2020-02-10 DIAGNOSIS — Z9189 Other specified personal risk factors, not elsewhere classified: Secondary | ICD-10-CM | POA: Diagnosis not present

## 2020-03-27 ENCOUNTER — Emergency Department (HOSPITAL_COMMUNITY)
Admission: EM | Admit: 2020-03-27 | Discharge: 2020-03-27 | Disposition: A | Discharge status: Skilled Nursing Facility | Payer: Medicare Other | Attending: Emergency Medicine | Admitting: Emergency Medicine

## 2020-03-27 ENCOUNTER — Other Ambulatory Visit: Payer: Self-pay

## 2020-03-27 ENCOUNTER — Encounter (HOSPITAL_COMMUNITY): Payer: Self-pay | Admitting: Emergency Medicine

## 2020-03-27 DIAGNOSIS — I10 Essential (primary) hypertension: Secondary | ICD-10-CM | POA: Insufficient documentation

## 2020-03-27 DIAGNOSIS — Z91040 Latex allergy status: Secondary | ICD-10-CM | POA: Diagnosis not present

## 2020-03-27 DIAGNOSIS — Z96653 Presence of artificial knee joint, bilateral: Secondary | ICD-10-CM | POA: Insufficient documentation

## 2020-03-27 DIAGNOSIS — Z87891 Personal history of nicotine dependence: Secondary | ICD-10-CM | POA: Insufficient documentation

## 2020-03-27 DIAGNOSIS — Z7982 Long term (current) use of aspirin: Secondary | ICD-10-CM | POA: Insufficient documentation

## 2020-03-27 DIAGNOSIS — R040 Epistaxis: Secondary | ICD-10-CM | POA: Diagnosis not present

## 2020-03-27 DIAGNOSIS — R58 Hemorrhage, not elsewhere classified: Secondary | ICD-10-CM | POA: Diagnosis not present

## 2020-03-27 DIAGNOSIS — Z79899 Other long term (current) drug therapy: Secondary | ICD-10-CM | POA: Insufficient documentation

## 2020-03-27 DIAGNOSIS — C50919 Malignant neoplasm of unspecified site of unspecified female breast: Secondary | ICD-10-CM | POA: Insufficient documentation

## 2020-03-27 DIAGNOSIS — Z743 Need for continuous supervision: Secondary | ICD-10-CM | POA: Diagnosis not present

## 2020-03-27 MED ORDER — LORAZEPAM 2 MG/ML IJ SOLN: 1.0000 mg | Freq: Once | INTRAMUSCULAR | Status: DC

## 2020-03-27 MED ORDER — AFRIN NASAL SPRAY 0.05 % NA SOLN
1.0000 | Freq: Two times a day (BID) | NASAL | 0 refills | Status: DC
Start: 2020-03-27 — End: 2020-08-11

## 2020-03-27 NOTE — ED Triage Notes (Addendum)
The patient had an episode of epistaxis last night and again this morning. While it was easily controlled last night, this morning the assisted living had some trouble, though they were able to by the time EMS arrived. Her MD wants afrin given. Patient recently has had multiple episodes of this. She is A&O at baseline with a history of dementia.    EMS vitals: 110/70 74 98%  129 98.7

## 2020-03-27 NOTE — Discharge Instructions (Signed)
Use Afrin as needed to help with nosebleeds up to 3 days. Follow-up with primary care provider. Return to the ER if you start to experience lightheadedness, chest pain or shortness of breath.

## 2020-03-27 NOTE — ED Provider Notes (Signed)
[facility] DEPT Provider Note   CSN: [id number] Arrival date & time: 03/27/20  2353     History Chief Complaint  Patient presents with  . Epistaxis    Dawn Diaz is a 86 y.o. female with a past medical history of hypertension presenting to the ED with a chief complaint of epistaxis.  Had an episode of epistaxis last night which resolved spontaneously.  Had another episode this morning.  This also resolved spontaneously.  She reports history of similar symptoms in the past.  She was sent to the ER by her primary care doctor for Afrin.  She denies any other complaints.  Denies any lightheadedness, hematemesis, injury or trauma.  Does report increased congestion.  HPI     Past Medical History:  Diagnosis Date  . Anxiety disorder   . Bacterial overgrowth syndrome   . Breast cancer, stage 1 [facility])    '97/recurrent '05 right breast(s/p mastectomy)  . Bunion   . Colon polyps   . Depression   . Diverticulitis   . GERD (gastroesophageal reflux disease)   . Glaucoma   . Hiatal hernia   . History of TIAs    - 13 yrs ago"brief memory loss"  . Hypertension   . IBS (irritable bowel syndrome)   . Insomnia   . Irritable bowel syndrome   . Osteoporosis   . Ovarian cyst     Patient Active Problem List   Diagnosis Date Noted  . Late onset Alzheimer's disease with behavioral disturbance ([place]) 01/19/2020  . Adult failure to thrive 09/13/2019  . Immune system disorder ([place]) 09/13/2019  . Depression, major, single episode, complete remission ([place]) 09/13/2019  . Bronchiectasis ([place]) 09/13/2019  . 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 Dysphagia 09/13/2019  . Red eye 09/13/2019  . Senile osteoporosis 08/28/2019  . Unsteady gait 08/28/2019  . Insomnia 08/23/2019  . Anxiety and depression 08/23/2019  . Diverticulosis 08/23/2019  . Thyroiditis 08/23/2019  . Colitis 08/23/2019  . Allergic rhinitis 08/23/2019  . Depression, major, single episode,  moderate ([place]) 03/28/2017  . Pulmonary nodule seen on imaging study 03/28/2017  . Acquired leg length discrepancy 03/28/2017  . Closed displaced fracture of right femoral neck ([place]) 03/22/2017  . Adenocarcinoma of breast ([place])   . Rheumatoid arthritis, seronegative, multiple sites ([place]) 11/15/2016  . Primary osteoarthritis of both hands 11/15/2016  . High risk medications (not anticoagulants) long-term use 11/15/2016  . Family history of malignant neoplasm of gastrointestinal tract 12/25/2013  . Constipation 04/24/2013  . Glaucoma 04/24/2013  . OA (osteoarthritis) of knee 04/15/2013  . History of breast cancer in female 12/20/2010  . DIVERTICULOSIS-COLON 08/06/2009  . IRRITABLE BOWEL SYNDROME 08/06/2009    Past Surgical History:  Procedure Laterality Date  . ABDOMINAL HYSTERECTOMY    . ANTERIOR APPROACH HEMI HIP ARTHROPLASTY Right 03/22/2017   Procedure: ANTERIOR APPROACH HEMI HIP ARTHROPLASTY;  Surgeon: Rod Can, MD;  Location: [facility];  Service: Orthopedics;  Laterality: Right;  . BREAST BIOPSY     right  . BUNIONECTOMY Bilateral    both, repeat x1  . CATARACT EXTRACTION Left   . KNEE ARTHROPLASTY    . MASTECTOMY  1997   Right  . TONSILLECTOMY    . TOTAL KNEE ARTHROPLASTY Right 04/15/2013   Procedure: RIGHT TOTAL KNEE ARTHROPLASTY;  Surgeon: Gearlean Alf, MD;  Location: [facility];  Service: Orthopedics;  Laterality: Right;     OB History   No obstetric history on file.     Family  History  Problem Relation Age of Onset  . Colon cancer Sister 2       Died at 60  . Kidney cancer Father 63  . Pancreatic cancer Mother 31  . Pancreatic cancer Maternal Grandmother 41  . Cancer Paternal Grandfather        died from "abdominal ca" at young age  . Breast cancer Neg Hx   . Stomach cancer Neg Hx   . Esophageal cancer Neg Hx     Social History   Tobacco Use  . Smoking status: Former Smoker    Years: 3.00    Types: Cigarettes    Quit date: 04/11/1951    Years  since quitting: 69.0  . Smokeless tobacco: Never Used  Vaping Use  . Vaping Use: Never used  Substance Use Topics  . Alcohol use: Yes    Comment: occ  . Drug use: No    Home Medications Prior to Admission medications   Medication Sig Start Date End Date Taking? Authorizing Provider  acetaminophen (TYLENOL) 500 MG tablet Take 500 mg by mouth every 8 (eight) hours as needed for mild pain.    Yes [provider]  amLODipine (NORVASC) 2.5 MG tablet Take 2.5 mg by mouth daily.  02/17/19  Yes [provider]  aspirin EC 81 MG tablet Take 81 mg by mouth daily.   Yes [provider]  Bacillus Coagulans-Inulin (PROBIOTIC FORMULA) 1-250 BILLION-MG CAPS Take 1 capsule by mouth in the morning and at bedtime.   Yes [provider]  Cholecalciferol (VITAMIN D) 125 MCG (5000 UT) CAPS Take 5,000 Units by mouth daily.   Yes [provider]  diclofenac Sodium (VOLTAREN) 1 % GEL Apply 4 g topically 4 (four) times daily. Left knee   Yes [provider]  dicyclomine (BENTYL) 10 MG capsule Take 1 capsule (10 mg total) by mouth 4 (four) times daily -  before meals and at bedtime. 07/01/19  Yes Willia Craze, NP  escitalopram (LEXAPRO) 20 MG tablet Take 1 tablet (20 mg total) by mouth daily. 08/28/19  Yes Reed, Tiffany L, DO  guaiFENesin (MUCINEX) 600 MG 12 hr tablet Take 600 mg by mouth daily.   Yes [provider]  hydroxychloroquine (PLAQUENIL) 200 MG tablet Take 200 mg by mouth daily.    Yes [provider]  loperamide (LOPERAMIDE A-D) 2 MG tablet Take 2 mg by mouth See admin instructions. Take 1 tablet (2mg ) by mouth twice daily, and an additional 1 tablet (2mg ) with each dose as needed for diarrhea   Yes [provider]  senna (SENOKOT) 8.6 MG tablet Take 1 tablet by mouth daily as needed for constipation.    Yes [provider]  oxymetazoline (AFRIN NASAL SPRAY) 0.05 % nasal spray Place 1 spray into both nostrils 2  (two) times daily. 03/27/20   Lexandra Rettke, PA-C    Allergies    Amoxicillin, Grass pollen(k-o-r-t-swt vern), and Latex  Review of Systems   Review of Systems  Constitutional: Negative for appetite change, chills and fever.  HENT: Positive for nosebleeds. Negative for ear pain, rhinorrhea, sneezing and sore throat.   Eyes: Negative for photophobia and visual disturbance.  Respiratory: Negative for cough, chest tightness, shortness of breath and wheezing.   Cardiovascular: Negative for chest pain and palpitations.  Gastrointestinal: Negative for abdominal pain, blood in stool, constipation, diarrhea, nausea and vomiting.  Genitourinary: Negative for dysuria, hematuria and urgency.  Musculoskeletal: Negative for myalgias.  Skin: Negative for rash.  Neurological:  Negative for dizziness, weakness and light-headedness.    Physical Exam Updated Vital Signs BP (!) 154/77 (BP Location: Left Arm)   Pulse 73   Temp 98.4 F (36.9 C) (Oral)   Resp (!) 21   SpO2 98%   Physical Exam Vitals and nursing note reviewed.  Constitutional:      General: She is not in acute distress.    Appearance: She is well-developed.  HENT:     Head: Normocephalic and atraumatic.     Nose: Nose normal.     Comments: No epistaxis noted.  Dried blood noted in bilateral nares. Eyes:     General: No scleral icterus.       Left eye: No discharge.     Conjunctiva/sclera: Conjunctivae normal.  Cardiovascular:     Rate and Rhythm: Normal rate and regular rhythm.     Heart sounds: Normal heart sounds. No murmur heard.  No friction rub. No gallop.   Pulmonary:     Effort: Pulmonary effort is normal. No respiratory distress.     Breath sounds: Normal breath sounds.  Abdominal:     General: Bowel sounds are normal. There is no distension.     Palpations: Abdomen is soft.     Tenderness: There is no abdominal tenderness. There is no guarding.  Musculoskeletal:        General: Normal range of motion.      Cervical back: Normal range of motion and neck supple.  Skin:    General: Skin is warm and dry.     Findings: No rash.  Neurological:     Mental Status: She is alert.     Motor: No abnormal muscle tone.     Coordination: Coordination normal.     ED Results / Procedures / Treatments   Labs (all labs ordered are listed, but only abnormal results are displayed) Labs Reviewed - No data to display  EKG None  Radiology No results found.  Procedures Procedures (including critical care time)  Medications Ordered in ED Medications - No data to display  ED Course  I have reviewed the triage vital signs and the nursing notes.  Pertinent labs & imaging results that were available during my care of the patient were reviewed by me and considered in my medical decision making (see chart for details).    MDM Rules/Calculators/A&P                          62-year-old female presenting to the ED for epistaxis episode x2.  One last night and one this morning.  No bleeding during my evaluation.  She was sent to the ED for Afrin.  Do not feel that she needs Afrin at this point but will prescribe her with a prescription for this.  Patient remains hemodynamically stable, denies any other complaints. Patient discussed with and seen by the attending, Dr. Zenia Resides.  Evaluation does not show pathology that would require ongoing emergent intervention or inpatient treatment. I explained the diagnosis to the patient. Pain has been managed and has no complaints prior to discharge. Patient is comfortable with above plan and is stable for discharge at this time. All questions were answered prior to disposition. Strict return precautions for returning to the ED were discussed. Encouraged follow up with PCP.   An After Visit Summary was printed and given to the patient.   Portions of this note were generated with [employer]obby[employer]. Dictation errors may occur despite best  attempts at  proofreading.  Final Clinical Impression(s) / ED Diagnoses Final diagnoses:  Epistaxis    Rx / DC Orders ED Discharge Orders         Ordered    oxymetazoline (AFRIN NASAL SPRAY) 0.05 % nasal spray  2 times daily        03/27/20 1001           Delia Heady, PA-C 03/27/20 1042    Lacretia Leigh, MD 03/30/20 1356

## 2020-03-27 NOTE — ED Notes (Addendum)
Patient proceeded to get out of bed on her own.  She stated she wanted to get out of the hospital.  Bed alarm activated and under patient Patient is very argumentative

## 2020-03-27 NOTE — ED Notes (Signed)
Sonya RN and Felecia RN assisted patient to restroom

## 2020-03-27 NOTE — ED Notes (Signed)
Staff entered the room after hearing a scream from out of the patient's room. Patient was found removing equipment and attempting to get out of the bed. Patient also removed her stockings. Patient had increased agitation from earlier. Staff walked patient to the restroom. When placing the patient back in bed, a bed alarm was also placed. Patient still has her shoes on.

## 2020-03-30 ENCOUNTER — Telehealth: Payer: Self-pay | Admitting: Adult Health

## 2020-03-30 NOTE — Telephone Encounter (Signed)
Dawn Diaz moved to the memory care unit and is having trouble adjusting. She is making attempts to leave and getting very angry that the unit is locked to prevent wandering. The nurse supervisor feels that she is not adjusting well and that her behavior is escalating. They are requesting a med to calm her down while she adjusts to her new home. Vistaril 25 mg tid prn agitation x 14 days given.

## 2020-04-06 DIAGNOSIS — Z79899 Other long term (current) drug therapy: Secondary | ICD-10-CM | POA: Diagnosis not present

## 2020-04-06 LAB — COMPREHENSIVE METABOLIC PANEL
Albumin: 4 (ref 3.5–5.0)
Calcium: 9.5 (ref 8.7–10.7)
Globulin: 2.6

## 2020-04-06 LAB — BASIC METABOLIC PANEL
BUN: 16 (ref 4–21)
CO2: 31 — AB (ref 13–22)
Chloride: 103 (ref 99–108)
Creatinine: 0.7 (ref 0.5–1.1)
Glucose: 91
Potassium: 4.3 (ref 3.4–5.3)
Sodium: 142 (ref 137–147)

## 2020-04-06 LAB — HEPATIC FUNCTION PANEL
ALT: 13 (ref 7–35)
AST: 18 (ref 13–35)

## 2020-04-06 LAB — CBC AND DIFFERENTIAL
HCT: 43 (ref 36–46)
Hemoglobin: 14.5 (ref 12.0–16.0)
Platelets: 230 (ref 150–399)
WBC: 4.6

## 2020-04-06 LAB — CBC: RBC: 4.85 (ref 3.87–5.11)

## 2020-04-07 ENCOUNTER — Non-Acute Institutional Stay (SKILLED_NURSING_FACILITY): Payer: Medicare Other | Admitting: Internal Medicine

## 2020-04-07 DIAGNOSIS — R079 Chest pain, unspecified: Secondary | ICD-10-CM | POA: Diagnosis not present

## 2020-04-07 DIAGNOSIS — R2681 Unsteadiness on feet: Secondary | ICD-10-CM | POA: Diagnosis not present

## 2020-04-07 DIAGNOSIS — G301 Alzheimer's disease with late onset: Secondary | ICD-10-CM | POA: Diagnosis not present

## 2020-04-07 DIAGNOSIS — F02818 Dementia in other diseases classified elsewhere, unspecified severity, with other behavioral disturbance: Secondary | ICD-10-CM

## 2020-04-07 DIAGNOSIS — F0281 Dementia in other diseases classified elsewhere with behavioral disturbance: Secondary | ICD-10-CM | POA: Diagnosis not present

## 2020-04-07 NOTE — Progress Notes (Signed)
Location:  Occupational psychologist of Service:  SNF (31) Provider:  Wayman Hoard L. Mariea Clonts, D.O., C.M.D.  Gayland Curry, DO  Patient Care Team: Gayland Curry, DO as PCP - General (Geriatric Medicine) Ladene Artist, MD (Gastroenterology)  Extended Emergency Contact Information Primary Emergency Contact: [place], [place] Phone: [phone] Relation: Daughter Secondary Emergency Contact: Otilio Carpen [place] Phone: [phone] Mobile Phone: [phone] Relation: Daughter  Code Status:  DNR Goals of care: Advanced Directive information Advanced Directives 01/14/2020  Does Patient Have a Medical Advance Directive? Yes  Type of Advance Directive Out of facility DNR (pink MOST or yellow form)  Does patient want to make changes to medical advance directive? No - Patient declined  Copy of [place] in Chart? -  Would patient like information on creating a medical advance directive? -  Pre-existing out of facility DNR order (yellow form or pink MOST form) -     Chief Complaint  Patient presents with  . Acute Visit    chest pain this morning after breakfast    HPI:  Pt is a 86 y.o. female seen today for an acute visit for chest pain after breakfast this am.  Pt c/o chest pain w/o sob, jaw pain, numbness, palpitations.  Nursing reported she had had OJ with her breakfast and perhaps it had been indigestion.  We got an EKG that was unchanged from 2019 and by the time that was done, her pain had resolved.  When I went to see her in the afternoon, she'd been attending activities already off the unit and then again on the unit.  She was exiting the activity to call someone in her room when I saw her.  When I introduced myself and said I was seeing her because of the chest pain she had this morning, she said she didn't know what that was about and kept walking at a fast pace past me with her rollator.  When I followed her  to her room, she parked her rollator in the corner by the window, closed her blinds, walked around the bed unsteadily, grabbed her phone number list, then attempted to call someone who must not have answered.   She denied any chest pain, sob, palpitations, abdominal pain or other symptoms since the episode in the morning.   She then got her rollator and came back out to the activity.     Past Medical History:  Diagnosis Date  . Anxiety disorder   . Bacterial overgrowth syndrome   . Breast cancer, stage 1 [facility])    '97/recurrent '05 right breast(s/p mastectomy)  . Bunion   . Colon polyps   . Depression   . Diverticulitis   . GERD (gastroesophageal reflux disease)   . Glaucoma   . Hiatal hernia   . History of TIAs    - 13 yrs ago"brief memory loss"  . Hypertension   . IBS (irritable bowel syndrome)   . Insomnia   . Irritable bowel syndrome   . Osteoporosis   . Ovarian cyst    Past Surgical History:  Procedure Laterality Date  . ABDOMINAL HYSTERECTOMY    . ANTERIOR APPROACH HEMI HIP ARTHROPLASTY Right 03/22/2017   Procedure: ANTERIOR APPROACH HEMI HIP ARTHROPLASTY;  Surgeon: Rod Can, MD;  Location: [facility];  Service: Orthopedics;  Laterality: Right;  . BREAST BIOPSY     right  . BUNIONECTOMY Bilateral    both, repeat x1  . CATARACT EXTRACTION Left   .  KNEE ARTHROPLASTY    . MASTECTOMY  1997   Right  . TONSILLECTOMY    . TOTAL KNEE ARTHROPLASTY Right 04/15/2013   Procedure: RIGHT TOTAL KNEE ARTHROPLASTY;  Surgeon: Gearlean Alf, MD;  Location: [facility];  Service: Orthopedics;  Laterality: Right;    Allergies  Allergen Reactions  . Amoxicillin Hives  . Grass Pollen(K-O-R-T-Swt Vern) Other (See Comments)    Listed on MAR  . Latex Itching    Outpatient Encounter Medications as of 04/07/2020  Medication Sig  . acetaminophen (TYLENOL) 500 MG tablet Take 500 mg by mouth every 8 (eight) hours as needed for mild pain.   Marland Kitchen amLODipine (NORVASC) 2.5 MG tablet Take 2.5 mg  by mouth daily.   Marland Kitchen aspirin EC 81 MG tablet Take 81 mg by mouth daily.  . Bacillus Coagulans-Inulin (PROBIOTIC FORMULA) 1-250 BILLION-MG CAPS Take 1 capsule by mouth in the morning and at bedtime.  . Cholecalciferol (VITAMIN D) 125 MCG (5000 UT) CAPS Take 5,000 Units by mouth daily.  . diclofenac Sodium (VOLTAREN) 1 % GEL Apply 4 g topically 4 (four) times daily. Left knee  . dicyclomine (BENTYL) 10 MG capsule Take 1 capsule (10 mg total) by mouth 4 (four) times daily -  before meals and at bedtime.  Marland Kitchen escitalopram (LEXAPRO) 20 MG tablet Take 1 tablet (20 mg total) by mouth daily.  Marland Kitchen guaiFENesin (MUCINEX) 600 MG 12 hr tablet Take 600 mg by mouth daily.  . hydroxychloroquine (PLAQUENIL) 200 MG tablet Take 200 mg by mouth daily.   . hydrOXYzine (ATARAX/VISTARIL) 25 MG tablet Take 25 mg by mouth 3 (three) times daily as needed.  . loperamide (LOPERAMIDE A-D) 2 MG tablet Take 2 mg by mouth See admin instructions. Take 1 tablet (2mg ) by mouth twice daily, and an additional 1 tablet (2mg ) with each dose as needed for diarrhea  . oxymetazoline (AFRIN NASAL SPRAY) 0.05 % nasal spray Place 1 spray into both nostrils 2 (two) times daily.  Marland Kitchen senna (SENOKOT) 8.6 MG tablet Take 1 tablet by mouth daily as needed for constipation.    No facility-administered encounter medications on file as of 04/07/2020.    Review of Systems  Constitutional: Negative for chills and fever.  Respiratory: Negative for cough and shortness of breath.   Cardiovascular: Negative for chest pain, palpitations and leg swelling.  Gastrointestinal: Negative for abdominal pain, constipation, heartburn, nausea and vomiting.  Genitourinary: Negative for dysuria.  Musculoskeletal: Positive for falls and joint pain. Negative for myalgias.  Skin: Negative for itching and rash.  Neurological: Negative for dizziness and loss of consciousness.  Endo/Heme/Allergies: Does not bruise/bleed easily.  Psychiatric/Behavioral: Positive for memory  loss. Negative for depression. The patient is not nervous/anxious and does not have insomnia.     Immunization History  Administered Date(s) Administered  . DTaP 11/20/2012  . Influenza, High Dose Seasonal PF 03/22/2018  . Influenza-Unspecified 04/14/2011, 03/21/2014, 03/21/2017, 04/14/2017, 03/22/2018, 03/22/2018  . Moderna SARS-COVID-2 Vaccination 06/27/2019, 07/30/2019  . Pneumococcal Polysaccharide-23 09/09/2008, 01/09/2018  . Td 11/20/2012   Pertinent  Health Maintenance Due  Topic Date Due  . DEXA SCAN  Never done  . PNA vac Low Risk Adult (2 of 2 - PCV13) 01/10/2019  . INFLUENZA VACCINE  01/19/2020   Fall Risk  12/04/2019 08/28/2019  Falls in the past year? 0 0  Number falls in past yr: 0 0  Injury with Fall? 0 0   Functional Status Survey:    Vitals:   04/07/20 1034  BP: 122/64  Pulse: 64  Temp: (!) 97.2 F (36.2 C)  SpO2: 96%   There is no height or weight on file to calculate BMI. Physical Exam Vitals reviewed.  Constitutional:      General: She is not in acute distress.    Appearance: Normal appearance. She is not toxic-appearing.  HENT:     Head: Normocephalic and atraumatic.  Cardiovascular:     Rate and Rhythm: Normal rate and regular rhythm.     Pulses: Normal pulses.     Heart sounds: Normal heart sounds.  Pulmonary:     Effort: Pulmonary effort is normal.     Breath sounds: Normal breath sounds. No wheezing, rhonchi or rales.  Abdominal:     General: Bowel sounds are normal.  Musculoskeletal:        General: Normal range of motion.     Right lower leg: No edema.     Left lower leg: No edema.  Skin:    General: Skin is warm and dry.  Neurological:     Mental Status: She is alert. Mental status is at baseline.     Motor: No weakness.     Gait: Gait abnormal.     Comments: Walks rapidly with rollator walker but very unsteady when she puts it aside--takes tiny steps and lifts her feet up high  Psychiatric:        Mood and Affect: Mood normal.      Labs reviewed: Recent Labs    07/03/19 1142 12/20/19 0952  NA 139 139  K 3.9 4.2  CL 104 101  CO2 28 30  GLUCOSE 109* 90  BUN 17 23  CREATININE 0.77 0.79  CALCIUM 9.0 9.3   Recent Labs    07/03/19 1142 12/20/19 0952  AST 20 22  ALT 15 16  ALKPHOS 89  --   BILITOT 0.4 0.5  PROT 6.5 6.8  ALBUMIN 3.6  --    Recent Labs    07/03/19 1142 12/20/19 0952  WBC 7.4 5.7  NEUTROABS 5.5 3,716  HGB 13.5 14.7  HCT 40.5 44.2  MCV 88.5 88.0  PLT 313.0 240   Lab Results  Component Value Date   TSH 1.71 07/03/2019   No results found for: HGBA1C No results found for: CHOL, HDL, LDLCALC, LDLDIRECT, TRIG, CHOLHDL EKG done by [facility] nursing personally reviewed:  NSR at 77bpm, Q waves in III and V1, poor R wave progression V4, V5, appears to have LVH  Assessment/Plan 1. Chest pain, unspecified type -suspect GERD--ate almost all of breakfast and had OJ and symptoms began after her meal, but did resolve on their own  -EKG was stable from 2019 -if recurs and symptoms remain unaccompanied, might consider course of PPI for 6 wks  2. Late onset Alzheimer's disease with behavioral disturbance ([place]) -poor historian and was difficult to pin down for even a couple of mins for this visit today--was on the move and not interested since she felt fine this afternoon when I was here to see her -psychiatry just adjusted her vistaril medication  3. Unsteady gait Ongoing and forgets how important it is to use her rollator so falls frequently  Family/ staff Communication: d/w 2nd shift [employer] nurse  Labs/tests ordered:  EKG done this am  Dieter Hane L. Gerarda Conklin, D.O. [facility] [address], [place] [address] Cell Phone (Mon-Fri 8am-5pm):  [phone] On Call:  [phone] & follow prompts after 5pm & weekends Office Phone:  [phone] Office Fax:  [phone]   

## 2020-04-08 ENCOUNTER — Encounter: Payer: Self-pay | Admitting: Internal Medicine

## 2020-04-09 ENCOUNTER — Encounter: Payer: Self-pay | Admitting: Internal Medicine

## 2020-04-13 ENCOUNTER — Encounter: Payer: Self-pay | Admitting: Internal Medicine

## 2020-04-13 ENCOUNTER — Non-Acute Institutional Stay (SKILLED_NURSING_FACILITY): Payer: Medicare Other | Admitting: Adult Health

## 2020-04-13 DIAGNOSIS — G301 Alzheimer's disease with late onset: Secondary | ICD-10-CM

## 2020-04-13 DIAGNOSIS — F321 Major depressive disorder, single episode, moderate: Secondary | ICD-10-CM | POA: Diagnosis not present

## 2020-04-13 DIAGNOSIS — F0281 Dementia in other diseases classified elsewhere with behavioral disturbance: Secondary | ICD-10-CM | POA: Diagnosis not present

## 2020-04-14 ENCOUNTER — Encounter: Payer: Self-pay | Admitting: Adult Health
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
## 2020-04-27 ENCOUNTER — Non-Acute Institutional Stay (SKILLED_NURSING_FACILITY): Payer: Medicare Other | Admitting: Adult Health

## 2020-04-27 ENCOUNTER — Encounter: Payer: Self-pay | Admitting: Adult Health

## 2020-04-27 DIAGNOSIS — F0281 Dementia in other diseases classified elsewhere with behavioral disturbance: Secondary | ICD-10-CM

## 2020-04-27 DIAGNOSIS — F02818 Dementia in other diseases classified elsewhere, unspecified severity, with other behavioral disturbance: Secondary | ICD-10-CM

## 2020-04-27 DIAGNOSIS — G301 Alzheimer's disease with late onset: Secondary | ICD-10-CM | POA: Diagnosis not present

## 2020-04-27 MED ORDER — ALPRAZOLAM 0.25 MG PO TABS: 0.2500 mg | ORAL_TABLET | Freq: Two times a day (BID) | ORAL | 0 refills | Status: AC | PRN
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
## 2020-05-20 ENCOUNTER — Other Ambulatory Visit: Payer: Self-pay

## 2020-05-20 ENCOUNTER — Telehealth: Payer: Self-pay

## 2020-05-20 MED ORDER — ALPRAZOLAM 0.25 MG PO TABS: 0.2500 mg | ORAL_TABLET | Freq: Two times a day (BID) | ORAL | 0 refills | Status: DC | PRN

## 2020-05-20 NOTE — Telephone Encounter (Signed)
Refill for xanax due to continued anxiety episodes.

## 2020-05-21 ENCOUNTER — Non-Acute Institutional Stay: Payer: Medicare Other | Admitting: Adult Health

## 2020-05-21 ENCOUNTER — Encounter: Payer: Self-pay | Admitting: Adult Health

## 2020-05-21 DIAGNOSIS — F02818 Dementia in other diseases classified elsewhere, unspecified severity, with other behavioral disturbance: Secondary | ICD-10-CM

## 2020-05-21 DIAGNOSIS — G301 Alzheimer's disease with late onset: Secondary | ICD-10-CM | POA: Diagnosis not present

## 2020-05-21 DIAGNOSIS — F0281 Dementia in other diseases classified elsewhere with behavioral disturbance: Secondary | ICD-10-CM

## 2020-05-21 NOTE — Progress Notes (Signed)
Location:  Occupational psychologist of Service:  ALF (13) Provider:   Cindi Carbon, ANP [place] [phone]   Gayland Curry, DO  Patient Care Team: Gayland Curry, DO as PCP - General (Geriatric Medicine) Ladene Artist, MD (Gastroenterology)  Extended Emergency Contact Information Primary Emergency Contact: [place], [place] Phone: [phone] Relation: Daughter Secondary Emergency Contact: Otilio Carpen [place] of [place] Phone: [phone] Mobile Phone: [phone] Relation: Daughter  Code Status:  DNR Goals of care: Advanced Directive information Advanced Directives 01/14/2020  Does Patient Have a Medical Advance Directive? Yes  Type of Advance Directive Out of facility DNR (pink MOST or yellow form)  Does patient want to make changes to medical advance directive? No - Patient declined  Copy of [place] in Chart? -  Would patient like information on creating a medical advance directive? -  Pre-existing out of facility DNR order (yellow form or pink MOST form) -     Chief Complaint  Patient presents with  . Acute Visit    behaviors    HPI:  Pt is a 86 y.o. female seen today for an acute visit for behaviors. Ms. Suppa has AD and moved to the memory care unit two months ago. She is ambulatory with a walker. One of the concerns is that she has made frequent attempts to elope. She stands at the door of the memory care unit and when someone else opens it she tries to get out. If you attempt to redirect her she becomes easily angered and can be verbally and physically aggressive. She was started on Depakote on 10/25. Minimal effect was noted. Xanax was added bid prn and this seems to have helped for a period of time but now her behaviors are escalating and the nurse is requesting a dose increase. They have attempted to take her for walks, distract with activities, etc. She gets a angry glare with  narrow focus and can not be directed per nursing staff. She hits the staff and makes attempts hit others with her walker.    Past Medical History:  Diagnosis Date  . Anxiety disorder   . Bacterial overgrowth syndrome   . Breast cancer, stage 1 [facility])    '97/recurrent '05 right breast(s/p mastectomy)  . Bunion   . Colon polyps   . Depression   . Diverticulitis   . GERD (gastroesophageal reflux disease)   . Glaucoma   . Hiatal hernia   . History of TIAs    - 13 yrs ago"brief memory loss"  . Hypertension   . IBS (irritable bowel syndrome)   . Insomnia   . Irritable bowel syndrome   . Osteoporosis   . Ovarian cyst    Past Surgical History:  Procedure Laterality Date  . ABDOMINAL HYSTERECTOMY    . ANTERIOR APPROACH HEMI HIP ARTHROPLASTY Right 03/22/2017   Procedure: ANTERIOR APPROACH HEMI HIP ARTHROPLASTY;  Surgeon: Rod Can, MD;  Location: [place];  Service: Orthopedics;  Laterality: Right;  . BREAST BIOPSY     right  . BUNIONECTOMY Bilateral    both, repeat x1  . CATARACT EXTRACTION Left   . KNEE ARTHROPLASTY    . MASTECTOMY  1997   Right  . TONSILLECTOMY    . TOTAL KNEE ARTHROPLASTY Right 04/15/2013   Procedure: RIGHT TOTAL KNEE ARTHROPLASTY;  Surgeon: Gearlean Alf, MD;  Location: [facility];  Service: Orthopedics;  Laterality: Right;    Allergies  Allergen Reactions  .  Amoxicillin Hives  . Grass Pollen(K-O-R-T-Swt Vern) Other (See Comments)    Listed on MAR  . Latex Itching    Outpatient Encounter Medications as of 05/21/2020  Medication Sig  . alendronate (FOSAMAX) 70 MG tablet Take 70 mg by mouth once a week. Take with a full glass of water on an empty stomach.  . bimatoprost (LUMIGAN) 0.01 % SOLN 1 drop at bedtime.  . dorzolamide-timolol (COSOPT) 22.3-6.8 MG/ML ophthalmic solution 1 drop 2 (two) times daily.  Marland Kitchen acetaminophen (TYLENOL) 500 MG tablet Take 500 mg by mouth every 8 (eight) hours as needed for mild pain.   Marland Kitchen ALPRAZolam (XANAX) 0.25 MG tablet Take  1 tablet (0.25 mg total) by mouth 2 (two) times daily as needed for anxiety (x14 days).  Marland Kitchen amLODipine (NORVASC) 2.5 MG tablet Take 2.5 mg by mouth daily.   Marland Kitchen aspirin EC 81 MG tablet Take 81 mg by mouth daily.  . Bacillus Coagulans-Inulin (PROBIOTIC FORMULA) 1-250 BILLION-MG CAPS Take 1 capsule by mouth in the morning and at bedtime.  . Cholecalciferol (VITAMIN D) 125 MCG (5000 UT) CAPS Take 5,000 Units by mouth daily.  . diclofenac Sodium (VOLTAREN) 1 % GEL Apply 4 g topically 4 (four) times daily. Left knee  . dicyclomine (BENTYL) 10 MG capsule Take 1 capsule (10 mg total) by mouth 4 (four) times daily -  before meals and at bedtime.  . divalproex (DEPAKOTE SPRINKLE) 125 MG capsule Take 125 mg by mouth 2 (two) times daily. At lunch to prevent afternoon agitation   . escitalopram (LEXAPRO) 20 MG tablet Take 1 tablet (20 mg total) by mouth daily.  Marland Kitchen guaiFENesin (MUCINEX) 600 MG 12 hr tablet Take 600 mg by mouth daily.  . hydroxychloroquine (PLAQUENIL) 200 MG tablet Take 200 mg by mouth daily.   Marland Kitchen loperamide (LOPERAMIDE A-D) 2 MG tablet Take 2 mg by mouth See admin instructions. Take 1 tablet (2mg ) by mouth twice daily, and an additional 1 tablet (2mg ) with each dose as needed for diarrhea  . oxymetazoline (AFRIN NASAL SPRAY) 0.05 % nasal spray Place 1 spray into both nostrils 2 (two) times daily. (Patient taking differently: Place 1 spray into both nostrils as needed. For nose bleed)  . [DISCONTINUED] senna (SENOKOT) 8.6 MG tablet Take 1 tablet by mouth daily as needed for constipation.    No facility-administered encounter medications on file as of 05/21/2020.    Review of Systems  Unable to perform ROS: Dementia    Immunization History  Administered Date(s) Administered  . DTaP 11/20/2012  . Influenza, High Dose Seasonal PF 03/22/2018  . Influenza-Unspecified 04/14/2011, 03/21/2014, 03/21/2017, 04/14/2017, 03/22/2018, 03/22/2018  . Moderna SARS-COVID-2 Vaccination 06/27/2019, 07/30/2019   . Pneumococcal Polysaccharide-23 09/09/2008, 01/09/2018  . Td 11/20/2012   Pertinent  Health Maintenance Due  Topic Date Due  . DEXA SCAN  Never done  . PNA vac Low Risk Adult (2 of 2 - PCV13) 01/10/2019  . INFLUENZA VACCINE  01/19/2020   Fall Risk  04/09/2020 12/04/2019 08/28/2019  Falls in the past year? - 0 0  Number falls in past yr: - 0 0  Injury with Fall? - 0 0  Risk for fall due to : History of fall(s);Impaired mobility;Impaired balance/gait;Mental status change;Medication side effect;Impaired vision - -  Follow up Education provided;Falls evaluation completed;Falls prevention discussed;Follow up appointment - -   Functional Status Survey:    There were no vitals filed for this visit. There is no height or weight on file to calculate BMI. Physical Exam Constitutional:  Appearance: Normal appearance.  Neurological:     General: No focal deficit present.     Mental Status: She is alert. Mental status is at baseline.  Psychiatric:     Comments: Slightly agitated but redirectable     Labs reviewed: Recent Labs    07/03/19 1142 12/20/19 0952 04/06/20 0000  NA 139 139 142  K 3.9 4.2 4.3  CL 104 101 103  CO2 28 30 31*  GLUCOSE 109* 90  --   BUN 17 23 16   CREATININE 0.77 0.79 0.7  CALCIUM 9.0 9.3 9.5   Recent Labs    07/03/19 1142 12/20/19 0952 04/06/20 0000  AST 20 22 18   ALT 15 16 13   ALKPHOS 89  --   --   BILITOT 0.4 0.5  --   PROT 6.5 6.8  --   ALBUMIN 3.6  --  4.0   Recent Labs    07/03/19 1142 12/20/19 0952 04/06/20 0000  WBC 7.4 5.7 4.6  NEUTROABS 5.5 3,716  --   HGB 13.5 14.7 14.5  HCT 40.5 44.2 43  MCV 88.5 88.0  --   PLT 313.0 240 230   Lab Results  Component Value Date   TSH 1.71 07/03/2019   No results found for: HGBA1C No results found for: CHOL, HDL, LDLCALC, LDLDIRECT, TRIG, CHOLHDL  Significant Diagnostic Results in last 30 days:  No results found.  Assessment/Plan 1. Late onset Alzheimer's disease with behavioral  disturbance (HCC) Increase the depakote to 125 mg bid due to aggressive behaviors not solved with no pharm measures.  Follow up labs as below Continue xanax bid prn     Family/ staff Communication: discussed with her nurse.   Labs/tests ordered:    CBC LFTs Dep leve 2 weeks

## 2020-05-25 DIAGNOSIS — H401122 Primary open-angle glaucoma, left eye, moderate stage: Secondary | ICD-10-CM | POA: Diagnosis not present

## 2020-05-25 DIAGNOSIS — H401113 Primary open-angle glaucoma, right eye, severe stage: Secondary | ICD-10-CM | POA: Diagnosis not present

## 2020-06-03 DIAGNOSIS — I129 Hypertensive chronic kidney disease with stage 1 through stage 4 chronic kidney disease, or unspecified chronic kidney disease: Secondary | ICD-10-CM | POA: Diagnosis not present

## 2020-06-03 DIAGNOSIS — I10 Essential (primary) hypertension: Secondary | ICD-10-CM | POA: Diagnosis not present

## 2020-06-04 ENCOUNTER — Encounter: Payer: Self-pay | Admitting: Adult Health

## 2020-06-04 ENCOUNTER — Non-Acute Institutional Stay: Payer: Medicare Other | Admitting: Adult Health

## 2020-06-04 DIAGNOSIS — G301 Alzheimer's disease with late onset: Secondary | ICD-10-CM | POA: Diagnosis not present

## 2020-06-04 DIAGNOSIS — F32A Depression, unspecified: Secondary | ICD-10-CM

## 2020-06-04 DIAGNOSIS — F02818 Dementia in other diseases classified elsewhere, unspecified severity, with other behavioral disturbance: Secondary | ICD-10-CM

## 2020-06-04 DIAGNOSIS — F419 Anxiety disorder, unspecified: Secondary | ICD-10-CM

## 2020-06-04 DIAGNOSIS — M0609 Rheumatoid arthritis without rheumatoid factor, multiple sites: Secondary | ICD-10-CM | POA: Diagnosis not present

## 2020-06-04 DIAGNOSIS — I10 Essential (primary) hypertension: Secondary | ICD-10-CM | POA: Diagnosis not present

## 2020-06-04 DIAGNOSIS — F0281 Dementia in other diseases classified elsewhere with behavioral disturbance: Secondary | ICD-10-CM | POA: Diagnosis not present

## 2020-06-04 DIAGNOSIS — K580 Irritable bowel syndrome with diarrhea: Secondary | ICD-10-CM | POA: Diagnosis not present

## 2020-06-04 MED ORDER — ALPRAZOLAM 0.5 MG PO TABS: 0.5000 mg | ORAL_TABLET | Freq: Two times a day (BID) | ORAL | 0 refills | Status: DC | PRN

## 2020-06-04 NOTE — Progress Notes (Signed)
Location:  Occupational psychologist of Service:  ALF (13) Provider:   Cindi Carbon, ANP [place] [phone]   Gayland Curry, DO  Patient Care Team: Gayland Curry, DO as PCP - General (Geriatric Medicine) Ladene Artist, MD (Gastroenterology)  Extended Emergency Contact Information Primary Emergency Contact: [place], [place] Phone: [phone] Relation: Daughter Secondary Emergency Contact: Otilio Carpen [place] of [place] Phone: [phone] Mobile Phone: [phone] Relation: Daughter  Code Status:  DNR Goals of care: Advanced Directive information Advanced Directives 01/14/2020  Does Patient Have a Medical Advance Directive? Yes  Type of Advance Directive Out of facility DNR (pink MOST or yellow form)  Does patient want to make changes to medical advance directive? No - Patient declined  Copy of [place] in Chart? -  Would patient like information on creating a medical advance directive? -  Pre-existing out of facility DNR order (yellow form or pink MOST form) -     Chief Complaint  Patient presents with  . Medical Management of Chronic Issues    HPI:  Pt is a 86 y.o. female seen today for medical management of chronic diseases.    Nurse reports that Ms. Platter' behaviors have worsened. She went through a period after depakote and xanax were administered where she was calmer, easier to redirect, and less aggressive but she is more agitated. She continues to make attempts to leave the unit and sometimes tries to take other residents with her. She is not easy to redirect and becomes very angry with the staff. The nurse states she can hit the staff and remains a danger to others. Depakote was increased to 125 mg bid two weeks ago.   RA: continues on plaquenil with no reports joint pain or swelling. Due to for eye exam.    IBS: no reported issues with constipation, diarrhea, or abd pain.  Bowel movements are recorded daily in the records.   Depression: there are no reports of crying or expressing sadness or depression. Main concerns are anxiety and irritability.   BP controlled.  Past Medical History:  Diagnosis Date  . Anxiety disorder   . Bacterial overgrowth syndrome   . Breast cancer, stage 1 [facility])    '97/recurrent '05 right breast(s/p mastectomy)  . Bunion   . Colon polyps   . Depression   . Diverticulitis   . GERD (gastroesophageal reflux disease)   . Glaucoma   . Hiatal hernia   . History of TIAs    - 13 yrs ago"brief memory loss"  . Hypertension   . IBS (irritable bowel syndrome)   . Insomnia   . Irritable bowel syndrome   . Osteoporosis   . Ovarian cyst    Past Surgical History:  Procedure Laterality Date  . ABDOMINAL HYSTERECTOMY    . ANTERIOR APPROACH HEMI HIP ARTHROPLASTY Right 03/22/2017   Procedure: ANTERIOR APPROACH HEMI HIP ARTHROPLASTY;  Surgeon: Rod Can, MD;  Location: [place];  Service: Orthopedics;  Laterality: Right;  . BREAST BIOPSY     right  . BUNIONECTOMY Bilateral    both, repeat x1  . CATARACT EXTRACTION Left   . KNEE ARTHROPLASTY    . MASTECTOMY  1997   Right  . TONSILLECTOMY    . TOTAL KNEE ARTHROPLASTY Right 04/15/2013   Procedure: RIGHT TOTAL KNEE ARTHROPLASTY;  Surgeon: Gearlean Alf, MD;  [facility];  Service: Orthopedics;  Laterality: Right;    Allergies  Allergen Reactions  .  Amoxicillin Hives  . Grass Pollen(K-O-R-T-Swt Vern) Other (See Comments)    Listed on MAR  . Latex Itching    Outpatient Encounter Medications as of 06/04/2020  Medication Sig  . acetaminophen (TYLENOL) 500 MG tablet Take 500 mg by mouth every 8 (eight) hours as needed for mild pain.   Marland Kitchen alendronate (FOSAMAX) 70 MG tablet Take 70 mg by mouth once a week. Take with a full glass of water on an empty stomach.  . ALPRAZolam (XANAX) 0.5 MG tablet Take 1 tablet (0.5 mg total) by mouth 2 (two) times daily as needed for anxiety.  Marland Kitchen  aspirin EC 81 MG tablet Take 81 mg by mouth daily.  . Bacillus Coagulans-Inulin (PROBIOTIC FORMULA) 1-250 BILLION-MG CAPS Take 1 capsule by mouth in the morning and at bedtime.  . bimatoprost (LUMIGAN) 0.01 % SOLN 1 drop at bedtime.  . diclofenac Sodium (VOLTAREN) 1 % GEL Apply 4 g topically 4 (four) times daily. Left knee  . dicyclomine (BENTYL) 10 MG capsule Take 1 capsule (10 mg total) by mouth 4 (four) times daily -  before meals and at bedtime.  Marland Kitchen guaiFENesin (MUCINEX) 600 MG 12 hr tablet Take 600 mg by mouth daily.  . hydroxychloroquine (PLAQUENIL) 200 MG tablet Take 200 mg by mouth daily.   Marland Kitchen loperamide (IMODIUM A-D) 2 MG tablet Take 2 mg by mouth See admin instructions. Take 1 tablet (2mg ) by mouth twice daily, and an additional 1 tablet (2mg ) with each dose as needed for diarrhea  . amLODipine (NORVASC) 2.5 MG tablet Take 2.5 mg by mouth daily.   . Cholecalciferol (VITAMIN D) 125 MCG (5000 UT) CAPS Take 5,000 Units by mouth daily.  . divalproex (DEPAKOTE SPRINKLE) 125 MG capsule Take 125 mg by mouth 2 (two) times daily. At lunch to prevent afternoon agitation  . dorzolamide-timolol (COSOPT) 22.3-6.8 MG/ML ophthalmic solution 1 drop 2 (two) times daily.  Marland Kitchen escitalopram (LEXAPRO) 20 MG tablet Take 1 tablet (20 mg total) by mouth daily.  Marland Kitchen oxymetazoline (AFRIN NASAL SPRAY) 0.05 % nasal spray Place 1 spray into both nostrils 2 (two) times daily. (Patient taking differently: Place 1 spray into both nostrils as needed. For nose bleed)  . [DISCONTINUED] ALPRAZolam (XANAX) 0.25 MG tablet Take 1 tablet (0.25 mg total) by mouth 2 (two) times daily as needed for anxiety (x14 days).  . [DISCONTINUED] ALPRAZolam (XANAX) 0.5 MG tablet Take 0.5 mg by mouth 2 (two) times daily as needed for anxiety.   No facility-administered encounter medications on file as of 06/04/2020.    Review of Systems  Constitutional: Negative for activity change, appetite change, chills, diaphoresis, fatigue, fever and  unexpected weight change.  HENT: Negative for congestion.   Respiratory: Negative for cough, shortness of breath and wheezing.   Cardiovascular: Negative for chest pain, palpitations and leg swelling.  Gastrointestinal: Negative for abdominal distention, abdominal pain, constipation and diarrhea.  Genitourinary: Negative for difficulty urinating and dysuria.  Musculoskeletal: Positive for gait problem. Negative for arthralgias, back pain, joint swelling and myalgias.  Neurological: Negative for dizziness, tremors, seizures, syncope, facial asymmetry, speech difficulty, weakness, light-headedness, numbness and headaches.  Psychiatric/Behavioral: Positive for agitation, behavioral problems and confusion. Negative for self-injury and sleep disturbance. The patient is nervous/anxious. The patient is not hyperactive.     Immunization History  Administered Date(s) Administered  . DTaP 11/20/2012  . Influenza, High Dose Seasonal PF 03/22/2018  . Influenza-Unspecified 04/14/2011, 03/21/2014, 03/21/2017, 04/14/2017, 03/22/2018, 03/22/2018  . Moderna Sars-Covid-2 Vaccination 06/27/2019, 07/30/2019  . Pneumococcal Polysaccharide-23  09/09/2008, 01/09/2018  . Td 11/20/2012   Pertinent  Health Maintenance Due  Topic Date Due  . PNA vac Low Risk Adult (2 of 2 - PCV13) 01/10/2019  . INFLUENZA VACCINE  01/19/2020  . DEXA SCAN  06/04/2021 (Originally 06/16/1994)   Fall Risk  04/09/2020 12/04/2019 08/28/2019  Falls in the past year? - 0 0  Number falls in past yr: - 0 0  Injury with Fall? - 0 0  Risk for fall due to : History of fall(s);Impaired mobility;Impaired balance/gait;Mental status change;Medication side effect;Impaired vision - -  Follow up Education provided;Falls evaluation completed;Falls prevention discussed;Follow up appointment - -   Functional Status Survey:    Vitals:   06/05/20 0830  Weight: 139 lb 3.2 oz (63.1 kg)   Body mass index is 21.8 kg/m. Physical Exam Vitals and  nursing note reviewed.  Constitutional:      General: She is not in acute distress.    Appearance: She is not diaphoretic.  HENT:     Head: Normocephalic and atraumatic.     Mouth/Throat:     Mouth: Mucous membranes are moist.     Pharynx: Oropharynx is clear.  Eyes:     Conjunctiva/sclera: Conjunctivae normal.     Pupils: Pupils are equal, round, and reactive to light.  Neck:     Vascular: No JVD.  Cardiovascular:     Rate and Rhythm: Normal rate and regular rhythm.     Heart sounds: No murmur heard.   Pulmonary:     Effort: Pulmonary effort is normal. No respiratory distress.     Breath sounds: Normal breath sounds. No wheezing.  Abdominal:     General: Bowel sounds are normal. There is no distension.     Palpations: Abdomen is soft.     Tenderness: There is no abdominal tenderness.  Musculoskeletal:     Right lower leg: No edema.     Left lower leg: No edema.  Skin:    General: Skin is warm and dry.  Neurological:     General: No focal deficit present.     Mental Status: She is alert. Mental status is at baseline.  Psychiatric:        Mood and Affect: Mood normal.     Labs reviewed: Recent Labs    07/03/19 1142 12/20/19 0952 04/06/20 0000  NA 139 139 142  K 3.9 4.2 4.3  CL 104 101 103  CO2 28 30 31*  GLUCOSE 109* 90  --   BUN 17 23 16   CREATININE 0.77 0.79 0.7  CALCIUM 9.0 9.3 9.5   Recent Labs    07/03/19 1142 12/20/19 0952 04/06/20 0000  AST 20 22 18   ALT 15 16 13   ALKPHOS 89  --   --   BILITOT 0.4 0.5  --   PROT 6.5 6.8  --   ALBUMIN 3.6  --  4.0   Recent Labs    07/03/19 1142 12/20/19 0952 04/06/20 0000  WBC 7.4 5.7 4.6  NEUTROABS 5.5 3,716  --   HGB 13.5 14.7 14.5  HCT 40.5 44.2 43  MCV 88.5 88.0  --   PLT 313.0 240 230   Lab Results  Component Value Date   TSH 1.71 07/03/2019   No results found for: HGBA1C No results found for: CHOL, HDL, LDLCALC, LDLDIRECT, TRIG, CHOLHDL  Significant Diagnostic Results in last 30 days:   No results found.  Assessment/Plan 1. Late onset Alzheimer's disease with behavioral disturbance ([place]) Moderate and progressing. Remains  ambulatory.  Dep level 34 06/03/20 Normal CBC and Lfts Continue Depakote 125 mg bid for two more weeks and if no improvement would increase to tid  2. Rheumatoid arthritis, seronegative, multiple sites ([place]) Followed by Dr. Patrecia Pour. Continues on Plaquenil. Nurse to coordinate eye exam at [facility].   3. Anxiety and depression Increase xanax to 0.5 mg bid as needed and continue Lexapro 20 mg qd   4. Irritable bowel syndrome with diarrhea Continue Bentyl QID and imodium bid as well as probiotic Avoid triggers  5. Primary hypertension Controlled. Continue norvasc 2.5 mg qd    Family/ staff Communication: discussed with her nurse Danielle.   Labs/tests ordered:   CBC CMP annually due to plaquenil use

## 2020-06-19 DIAGNOSIS — R410 Disorientation, unspecified: Secondary | ICD-10-CM | POA: Diagnosis not present

## 2020-06-24 ENCOUNTER — Other Ambulatory Visit: Payer: Self-pay | Admitting: Internal Medicine

## 2020-06-24 MED ORDER — ALPRAZOLAM 0.5 MG PO TABS: 0.5000 mg | ORAL_TABLET | Freq: Two times a day (BID) | ORAL | 5 refills | Status: DC | PRN

## 2020-07-07 ENCOUNTER — Encounter: Payer: Self-pay | Admitting: Internal Medicine

## 2020-07-07 LAB — HEPATIC FUNCTION PANEL
ALT: 18 (ref 7–35)
AST: 23 (ref 13–35)

## 2020-07-07 LAB — BASIC METABOLIC PANEL
BUN: 25 — AB (ref 4–21)
CO2: 28 — AB (ref 13–22)
Chloride: 105 (ref 99–108)
Creatinine: 0.8 (ref 0.5–1.1)
Glucose: 98
Potassium: 4.3 (ref 3.4–5.3)
Sodium: 141 (ref 137–147)

## 2020-07-07 LAB — COMPREHENSIVE METABOLIC PANEL
Albumin: 3.7 (ref 3.5–5.0)
Calcium: 9 (ref 8.7–10.7)
Globulin: 2.3

## 2020-08-10 ENCOUNTER — Encounter: Payer: Self-pay | Admitting: Internal Medicine

## 2020-08-11 ENCOUNTER — Non-Acute Institutional Stay (SKILLED_NURSING_FACILITY): Payer: 59 | Admitting: Internal Medicine

## 2020-08-11 ENCOUNTER — Encounter: Payer: Self-pay | Admitting: Internal Medicine

## 2020-08-11 DIAGNOSIS — M0609 Rheumatoid arthritis without rheumatoid factor, multiple sites: Secondary | ICD-10-CM

## 2020-08-11 DIAGNOSIS — F03918 Unspecified dementia, unspecified severity, with other behavioral disturbance: Secondary | ICD-10-CM

## 2020-08-11 DIAGNOSIS — F321 Major depressive disorder, single episode, moderate: Secondary | ICD-10-CM | POA: Diagnosis not present

## 2020-08-11 DIAGNOSIS — R2681 Unsteadiness on feet: Secondary | ICD-10-CM

## 2020-08-11 DIAGNOSIS — F0391 Unspecified dementia with behavioral disturbance: Secondary | ICD-10-CM | POA: Diagnosis not present

## 2020-08-11 DIAGNOSIS — G301 Alzheimer's disease with late onset: Secondary | ICD-10-CM | POA: Diagnosis not present

## 2020-08-11 DIAGNOSIS — F0281 Dementia in other diseases classified elsewhere with behavioral disturbance: Secondary | ICD-10-CM

## 2020-08-11 DIAGNOSIS — I10 Essential (primary) hypertension: Secondary | ICD-10-CM

## 2020-08-11 DIAGNOSIS — K580 Irritable bowel syndrome with diarrhea: Secondary | ICD-10-CM

## 2020-08-11 DIAGNOSIS — Z66 Do not resuscitate: Secondary | ICD-10-CM

## 2020-08-11 DIAGNOSIS — F02818 Dementia in other diseases classified elsewhere, unspecified severity, with other behavioral disturbance: Secondary | ICD-10-CM
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
## 2020-08-13 ENCOUNTER — Telehealth: Payer: Self-pay | Admitting: Adult Health

## 2020-08-13 MED ORDER — ALPRAZOLAM 0.5 MG PO TBDP: 0.5000 mg | ORAL_TABLET | Freq: Four times a day (QID) | ORAL | 0 refills | Status: DC | PRN
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
## 2020-09-21 ENCOUNTER — Encounter: Payer: Self-pay | Admitting: Internal Medicine

## 2020-09-21 ENCOUNTER — Non-Acute Institutional Stay (SKILLED_NURSING_FACILITY): Payer: Medicare Other | Admitting: Internal Medicine

## 2020-09-21 DIAGNOSIS — F0391 Unspecified dementia with behavioral disturbance: Secondary | ICD-10-CM

## 2020-09-21 DIAGNOSIS — M0609 Rheumatoid arthritis without rheumatoid factor, multiple sites: Secondary | ICD-10-CM | POA: Diagnosis not present

## 2020-09-21 DIAGNOSIS — F02818 Dementia in other diseases classified elsewhere, unspecified severity, with other behavioral disturbance: Secondary | ICD-10-CM

## 2020-09-21 DIAGNOSIS — I10 Essential (primary) hypertension: Secondary | ICD-10-CM

## 2020-09-21 DIAGNOSIS — F03918 Unspecified dementia, unspecified severity, with other behavioral disturbance: Secondary | ICD-10-CM

## 2020-09-21 DIAGNOSIS — F321 Major depressive disorder, single episode, moderate: Secondary | ICD-10-CM

## 2020-09-21 DIAGNOSIS — L89512 Pressure ulcer of right ankle, stage 2: Secondary | ICD-10-CM

## 2020-09-21 DIAGNOSIS — G301 Alzheimer's disease with late onset: Secondary | ICD-10-CM

## 2020-09-21 DIAGNOSIS — K580 Irritable bowel syndrome with diarrhea: Secondary | ICD-10-CM

## 2020-09-21 DIAGNOSIS — F0281 Dementia in other diseases classified elsewhere with behavioral disturbance: Secondary | ICD-10-CM

## 2020-09-21 NOTE — Progress Notes (Signed)
Location:     [place] Room Number: [place] of Service:  SNF [phone]) Provider:  Veleta Miners MD  Virgie Dad, MD  Patient Care Team: Virgie Dad, MD as PCP - General (Internal Medicine) Ladene Artist, MD (Gastroenterology)  Extended Emergency Contact Information Primary Emergency Contact: [place], [place] Phone: [phone] Relation: Daughter Secondary Emergency Contact: Otilio Carpen [place] Phone: [phone] Mobile Phone: [phone] Relation: Daughter  Code Status:  DNR Goals of care: Advanced Directive information Advanced Directives 08/11/2020  Does Patient Have a Medical Advance Directive? Yes  Type of Advance Directive Out of facility DNR (pink MOST or yellow form)  Does patient want to make changes to medical advance directive? No - Patient declined  Copy of [place] in Chart? -  Would patient like information on creating a medical advance directive? -  Pre-existing out of facility DNR order (yellow form or pink MOST form) Yellow form placed in chart (order not valid for inpatient use)     Chief Complaint  Patient presents with  . Acute Visit    Pressure wound    HPI:  Pt is a 87 y.o. female seen today for an acute visit for New Pressure Wound on Right Lateral Malleolus Patient has a history of Alzheimer's disease with behavioral disturbances, rheumatoid arthritis seronegative on Plaquenil, depression, IBS with diarrhea, hypertension  Patient was seen for her new pressure on her right lateral malleolus. Patient unable to give much history New pressure wound was noticed by the nurses.  Patient does not seem to be in any pain no fever. Patient continues to have behavior issues including trying to disrobe today when I was examining her. Her weight is stable   Past Medical History:  Diagnosis Date  . Anxiety disorder   . Bacterial overgrowth syndrome   .  Breast cancer, stage 1 [facility])    '97/recurrent '05 right breast(s/p mastectomy)  . Bunion   . Colon polyps   . Depression   . Diverticulitis   . GERD (gastroesophageal reflux disease)   . Glaucoma   . Hiatal hernia   . History of TIAs    - 13 yrs ago"brief memory loss"  . Hypertension   . IBS (irritable bowel syndrome)   . Insomnia   . Irritable bowel syndrome   . Osteoporosis   . Ovarian cyst    Past Surgical History:  Procedure Laterality Date  . ABDOMINAL HYSTERECTOMY    . ANTERIOR APPROACH HEMI HIP ARTHROPLASTY Right 03/22/2017   Procedure: ANTERIOR APPROACH HEMI HIP ARTHROPLASTY;  Surgeon: Rod Can, MD;  Location: [place];  Service: Orthopedics;  Laterality: Right;  . BREAST BIOPSY     right  . BUNIONECTOMY Bilateral    both, repeat x1  . CATARACT EXTRACTION Left   . KNEE ARTHROPLASTY    . MASTECTOMY  1997   Right  . TONSILLECTOMY    . TOTAL KNEE ARTHROPLASTY Right 04/15/2013   Procedure: RIGHT TOTAL KNEE ARTHROPLASTY;  Surgeon: Gearlean Alf, MD;  Location: [facility];  Service: Orthopedics;  Laterality: Right;    Allergies  Allergen Reactions  . Amoxicillin Hives  . Grass Pollen(K-O-R-T-Swt Vern) Other (See Comments)    Listed on MAR  . Latex Itching    Allergies as of 09/21/2020      Reactions   Amoxicillin Hives   Grass Pollen(k-o-r-t-swt Vern) Other (See Comments)   Listed on MAR   Latex Itching  Medication List       Accurate as of September 21, 2020 11:59 PM. If you have any questions, ask your nurse or doctor.        STOP taking these medications   ALPRAZolam 0.5 MG tablet Commonly known as: XANAX Stopped by: Virgie Dad, MD   escitalopram 10 MG tablet Commonly known as: LEXAPRO Stopped by: Virgie Dad, MD   OLANZapine 10 MG tablet Commonly known as: ZYPREXA Stopped by: Virgie Dad, MD     TAKE these medications   acetaminophen 500 MG tablet Commonly known as: TYLENOL Take 500 mg by mouth every 8 (eight) hours as needed  for mild pain.   amLODipine 2.5 MG tablet Commonly known as: NORVASC Take 2.5 mg by mouth daily.   aspirin EC 81 MG tablet Take 81 mg by mouth daily.   bimatoprost 0.01 % Soln Commonly known as: LUMIGAN 1 drop at bedtime.   bisacodyl 5 MG EC tablet Commonly known as: DULCOLAX Take 5 mg by mouth daily as needed for moderate constipation.   citalopram 20 MG tablet Commonly known as: CELEXA Take 20 mg by mouth daily.   diclofenac Sodium 1 % Gel Commonly known as: VOLTAREN Apply 4 g topically 4 (four) times daily. Left knee   dicyclomine 10 MG capsule Commonly known as: BENTYL Take 1 capsule (10 mg total) by mouth 4 (four) times daily -  before meals and at bedtime.   divalproex 125 MG capsule Commonly known as: DEPAKOTE SPRINKLE Take 500 mg by mouth 2 (two) times daily. At lunch to prevent afternoon agitation   donepezil 5 MG tablet Commonly known as: ARICEPT Take 5 mg by mouth at bedtime.   dorzolamide-timolol 22.3-6.8 MG/ML ophthalmic solution Commonly known as: COSOPT 1 drop 2 (two) times daily.   hydroxychloroquine 200 MG tablet Commonly known as: PLAQUENIL Take 200 mg by mouth daily.   LORazepam 1 MG tablet Commonly known as: ATIVAN Take 1 mg by mouth as needed for anxiety.   melatonin 1 MG Tabs tablet Take 2 mg by mouth at bedtime.   polyethylene glycol 17 g packet Commonly known as: MIRALAX / GLYCOLAX Take 17 g by mouth daily. In the morning 8-11 am   prazosin 1 MG capsule Commonly known as: MINIPRESS Take 1 mg by mouth at bedtime.   Rexulti 1 MG Tabs tablet Generic drug: brexpiprazole Take by mouth daily.   traZODone 100 MG tablet Commonly known as: DESYREL Take 100 mg by mouth at bedtime.   traZODone 50 MG tablet Commonly known as: DESYREL Take 25 mg by mouth every morning.   traZODone 50 MG tablet Commonly known as: DESYREL Take 50 mg by mouth daily.   traZODone 50 MG tablet Commonly known as: DESYREL Take 50 mg by mouth at bedtime.  9 pm   Vitamin D 125 MCG (5000 UT) Caps Take 5,000 Units by mouth daily.       Review of Systems  Unable to perform ROS: Dementia    Immunization History  Administered Date(s) Administered  . DTaP 11/20/2012  . Influenza, High Dose Seasonal PF 03/22/2018, 04/10/2020  . Influenza-Unspecified 04/14/2011, 03/21/2014, 03/21/2017, 04/14/2017, 03/22/2018, 03/22/2018  . Moderna SARS-COV2 Booster Vaccination 04/29/2020  . Moderna Sars-Covid-2 Vaccination 06/27/2019, 07/30/2019  . Pneumococcal Polysaccharide-23 09/09/2008, 01/09/2018  . Td 11/20/2012   Pertinent  Health Maintenance Due  Topic Date Due  . PNA vac Low Risk Adult (2 of 2 - PCV13) 01/10/2019  . DEXA SCAN  06/04/2021 (Originally 06/16/1994)  .  INFLUENZA VACCINE  01/18/2021   Fall Risk  04/09/2020 12/04/2019 08/28/2019  Falls in the past year? - 0 0  Number falls in past yr: - 0 0  Injury with Fall? - 0 0  Risk for fall due to : History of fall(s);Impaired mobility;Impaired balance/gait;Mental status change;Medication side effect;Impaired vision - -  Follow up Education provided;Falls evaluation completed;Falls prevention discussed;Follow up appointment - -   Functional Status Survey:    Vitals:   09/21/20 1640  BP: (!) 156/76  Pulse: 62  Resp: 16  Temp: (!) 97.2 F (36.2 C)  SpO2: 98%  Weight: 129 lb 9.6 oz (58.8 kg)  Height: 5\' 7"  (1.702 m)   Body mass index is 20.3 kg/m. Physical Exam Vitals reviewed.  HENT:     Head: Normocephalic.     Nose: Nose normal.     Mouth/Throat:     Mouth: Mucous membranes are moist.     Pharynx: Oropharynx is clear.  Eyes:     Pupils: Pupils are equal, round, and reactive to light.  Pulmonary:     Effort: Pulmonary effort is normal.     Breath sounds: Normal breath sounds.  Abdominal:     General: Abdomen is flat. Bowel sounds are normal.     Palpations: Abdomen is soft.  Musculoskeletal:        General: No swelling.  Skin:    General: Skin is warm.     Comments:  Had Stage 2 Pressure wound in Right Lateral Malleolus Total wound size was 2 cm with small area of Necrosis. No depth or Odor. Mild Discharge  Neurological:     General: No focal deficit present.     Mental Status: She is alert.     Comments: Aphasia with her dementia Unable to do any more exam  Psychiatric:     Comments: Having behavior issues including trying to stand up and disrobe herself     Labs reviewed: Recent Labs    12/20/19 0952 04/06/20 0000 07/07/20 0000  NA 139 142 141  K 4.2 4.3 4.3  CL 101 103 105  CO2 30 31* 28*  GLUCOSE 90  --   --   BUN 23 16 25*  CREATININE 0.79 0.7 0.8  CALCIUM 9.3 9.5 9.0   Recent Labs    12/20/19 0952 04/06/20 0000 07/07/20 0000  AST 22 18 23   ALT 16 13 18   BILITOT 0.5  --   --   PROT 6.8  --   --   ALBUMIN  --  4.0 3.7   Recent Labs    12/20/19 0952 04/06/20 0000  WBC 5.7 4.6  NEUTROABS 3,716  --   HGB 14.7 14.5  HCT 44.2 43  MCV 88.0  --   PLT 240 230   Lab Results  Component Value Date   TSH 1.71 07/03/2019   No results found for: HGBA1C No results found for: CHOL, HDL, LDLCALC, LDLDIRECT, TRIG, CHOLHDL  Significant Diagnostic Results in last 30 days:  No results found.  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

## 2020-10-01 ENCOUNTER — Non-Acute Institutional Stay (SKILLED_NURSING_FACILITY): Payer: Medicare Other | Admitting: Orthopedic Surgery

## 2020-10-01 ENCOUNTER — Encounter: Payer: Self-pay | Admitting: Orthopedic Surgery

## 2020-10-01 DIAGNOSIS — G301 Alzheimer's disease with late onset: Secondary | ICD-10-CM | POA: Diagnosis not present

## 2020-10-01 DIAGNOSIS — F0281 Dementia in other diseases classified elsewhere with behavioral disturbance: Secondary | ICD-10-CM | POA: Diagnosis not present

## 2020-10-01 DIAGNOSIS — R634 Abnormal weight loss: Secondary | ICD-10-CM

## 2020-10-01 DIAGNOSIS — I959 Hypotension, unspecified: Secondary | ICD-10-CM | POA: Diagnosis not present

## 2020-10-01 NOTE — Progress Notes (Signed)
Location:  [place] Room Number: [place] of Service:  SNF [id number]) Provider: Windell Moulding, AGNP-C  Virgie Dad, MD  Patient Care Team: Virgie Dad, MD as PCP - General (Internal Medicine) Ladene Artist, MD (Gastroenterology)  Extended Emergency Contact Information Primary Emergency Contact: [place], [place] Phone: [phone] Relation: Daughter Secondary Emergency Contact: Otilio Carpen [place] of [place] Phone: [phone] Mobile Phone: [phone] Relation: Daughter  Code Status: DNR Goals of care: Advanced Directive information Advanced Directives 08/11/2020  Does Patient Have a Medical Advance Directive? Yes  Type of Advance Directive Out of facility DNR (pink MOST or yellow form)  Does patient want to make changes to medical advance directive? No - Patient declined  Copy of [employer] in Chart? -  Would patient like information on creating a medical advance directive? -  Pre-existing out of facility DNR order (yellow form or pink MOST form) Yellow form placed in chart (order not valid for inpatient use)     Chief Complaint  Patient presents with  . Acute Visit    Weight loss    HPI:  Pt is a 87 y.o. female seen today for acute visit for weight loss.   History of Alzheimer's disease with behavioral disturbances, resides on memory care unit. Followed by Dr. Casimiro Needle. 06/2020 hospitalized in [place] for aggressive dementia behaviors. She has been back at [facility] for about 6 weeks, nursing staff reports progressive weight loss, low blood pressure and left sided facial droop.   Recent weights are as follows:  04/01- 118.4 lbs  03/01- 129.6 lbs  02/18- 125 lbs Nursing reports they try to promote food when she is awake, appetite described as poor.   Today's blood pressure 88/42.  Recent blood pressures are as follows:  04/10- 138/64, 98/52  04/08- 128/64  03/30-  156/76  03/26- 143/83  Left facial droop noticed early this morning. She has full strength and movement of all extremities. Smile and frown symmetrical. No issues with swallowing. Remains nonverbal during encounter.    Past Medical History:  Diagnosis Date  . Anxiety disorder   . Bacterial overgrowth syndrome   . Breast cancer, stage 1 [facility])    '97/recurrent '05 right breast(s/p mastectomy)  . Bunion   . Colon polyps   . Depression   . Diverticulitis   . GERD (gastroesophageal reflux disease)   . Glaucoma   . Hiatal hernia   . History of TIAs    - 13 yrs ago"brief memory loss"  . Hypertension   . IBS (irritable bowel syndrome)   . Insomnia   . Irritable bowel syndrome   . Osteoporosis   . Ovarian cyst    Past Surgical History:  Procedure Laterality Date  . ABDOMINAL HYSTERECTOMY    . ANTERIOR APPROACH HEMI HIP ARTHROPLASTY Right 03/22/2017   Procedure: ANTERIOR APPROACH HEMI HIP ARTHROPLASTY;  Surgeon: Rod Can, MD;  Location: [place];  Service: Orthopedics;  Laterality: Right;  . BREAST BIOPSY     right  . BUNIONECTOMY Bilateral    both, repeat x1  . CATARACT EXTRACTION Left   . KNEE ARTHROPLASTY    . MASTECTOMY  1997   Right  . TONSILLECTOMY    . TOTAL KNEE ARTHROPLASTY Right 04/15/2013   Procedure: RIGHT TOTAL KNEE ARTHROPLASTY;  Surgeon: Gearlean Alf, MD;  Location: [facility];  Service: Orthopedics;  Laterality: Right;    Allergies  Allergen Reactions  . Amoxicillin Hives  . Grass Pollen(K-O-R-T-Swt Vern)  Other (See Comments)    Listed on MAR  . Latex Itching    Outpatient Encounter Medications as of 10/01/2020  Medication Sig  . acetaminophen (TYLENOL) 500 MG tablet Take 500 mg by mouth every 8 (eight) hours as needed for mild pain.   Marland Kitchen amLODipine (NORVASC) 2.5 MG tablet Take 2.5 mg by mouth daily.   Marland Kitchen aspirin EC 81 MG tablet Take 81 mg by mouth daily.  . bimatoprost (LUMIGAN) 0.01 % SOLN 1 drop at bedtime.  . bisacodyl (DULCOLAX) 5 MG EC tablet  Take 5 mg by mouth daily as needed for moderate constipation.  . brexpiprazole (REXULTI) 1 MG TABS tablet Take by mouth daily.  . Cholecalciferol (VITAMIN D) 125 MCG (5000 UT) CAPS Take 5,000 Units by mouth daily.  . citalopram (CELEXA) 20 MG tablet Take 20 mg by mouth daily.  . diclofenac Sodium (VOLTAREN) 1 % GEL Apply 4 g topically 4 (four) times daily. Left knee  . dicyclomine (BENTYL) 10 MG capsule Take 1 capsule (10 mg total) by mouth 4 (four) times daily -  before meals and at bedtime.  . divalproex (DEPAKOTE SPRINKLE) 125 MG capsule Take 500 mg by mouth 2 (two) times daily. At lunch to prevent afternoon agitation  . donepezil (ARICEPT) 5 MG tablet Take 5 mg by mouth at bedtime.  . dorzolamide-timolol (COSOPT) 22.3-6.8 MG/ML ophthalmic solution 1 drop 2 (two) times daily.  . hydroxychloroquine (PLAQUENIL) 200 MG tablet Take 200 mg by mouth daily.   . melatonin 1 MG TABS tablet Take 2 mg by mouth at bedtime.  . polyethylene glycol (MIRALAX / GLYCOLAX) 17 g packet Take 17 g by mouth daily. In the morning 8-11 am  . prazosin (MINIPRESS) 1 MG capsule Take 1 mg by mouth at bedtime.  . traZODone (DESYREL) 100 MG tablet Take 100 mg by mouth at bedtime.  . traZODone (DESYREL) 50 MG tablet Take 50 mg by mouth at bedtime. 9 pm  . traZODone (DESYREL) 50 MG tablet Take 25 mg by mouth every morning.  . traZODone (DESYREL) 50 MG tablet Take 50 mg by mouth daily.   No facility-administered encounter medications on file as of 10/01/2020.    Review of Systems  Unable to perform ROS: Dementia    Immunization History  Administered Date(s) Administered  . DTaP 11/20/2012  . Influenza, High Dose Seasonal PF 03/22/2018, 04/10/2020  . Influenza-Unspecified 04/14/2011, 03/21/2014, 03/21/2017, 04/14/2017, 03/22/2018, 03/22/2018  . Moderna SARS-COV2 Booster Vaccination 04/29/2020  . Moderna Sars-Covid-2 Vaccination 06/27/2019, 07/30/2019  . Pneumococcal Polysaccharide-23 09/09/2008, 01/09/2018  . Td  11/20/2012   Pertinent  Health Maintenance Due  Topic Date Due  . PNA vac Low Risk Adult (2 of 2 - PCV13) 01/10/2019  . DEXA SCAN  06/04/2021 (Originally 06/16/1994)  . INFLUENZA VACCINE  01/18/2021   Fall Risk  04/09/2020 12/04/2019 08/28/2019  Falls in the past year? - 0 0  Number falls in past yr: - 0 0  Injury with Fall? - 0 0  Risk for fall due to : History of fall(s);Impaired mobility;Impaired balance/gait;Mental status change;Medication side effect;Impaired vision - -  Follow up Education provided;Falls evaluation completed;Falls prevention discussed;Follow up appointment - -   Functional Status Survey:    Vitals:   10/01/20 1246  BP: 138/68  Pulse: 70  Resp: 16  Temp: (!) 96.8 F (36 C)  SpO2: 96%  Weight: 118 lb 6.4 oz (53.7 kg)   Body mass index is 18.54 kg/m. Physical Exam Vitals reviewed.  Constitutional:  General: She is not in acute distress. HENT:     Head: Normocephalic.  Cardiovascular:     Rate and Rhythm: Normal rate and regular rhythm.     Pulses: Normal pulses.     Heart sounds: Normal heart sounds. No murmur heard.   Pulmonary:     Effort: Pulmonary effort is normal. No respiratory distress.     Breath sounds: Normal breath sounds. No wheezing.  Abdominal:     General: Abdomen is flat. Bowel sounds are normal. There is no distension.     Palpations: Abdomen is soft.     Tenderness: There is no abdominal tenderness.  Musculoskeletal:     Right lower leg: No edema.     Left lower leg: No edema.  Skin:    General: Skin is warm and dry.     Capillary Refill: Capillary refill takes less than 2 seconds.  Neurological:     Mental Status: She is easily aroused. She is disoriented and confused.     Sensory: Sensation is intact.     Motor: Motor function is intact.     Comments: Unable to follow commands. Upper and lower extremity strength 5/5. Smile and frown symmetrical.      Labs reviewed: Recent Labs    12/20/19 0952 04/06/20 0000  07/07/20 0000  NA 139 142 141  K 4.2 4.3 4.3  CL 101 103 105  CO2 30 31* 28*  GLUCOSE 90  --   --   BUN 23 16 25*  CREATININE 0.79 0.7 0.8  CALCIUM 9.3 9.5 9.0   Recent Labs    12/20/19 0952 04/06/20 0000 07/07/20 0000  AST 22 18 23   ALT 16 13 18   BILITOT 0.5  --   --   PROT 6.8  --   --   ALBUMIN  --  4.0 3.7   Recent Labs    12/20/19 0952 04/06/20 0000  WBC 5.7 4.6  NEUTROABS 3,716  --   HGB 14.7 14.5  HCT 44.2 43  MCV 88.0  --   PLT 240 230   Lab Results  Component Value Date   TSH 1.71 07/03/2019   No results found for: HGBA1C No results found for: CHOL, HDL, LDLCALC, LDLDIRECT, TRIG, CHOLHDL  Significant Diagnostic Results in last 30 days:  No results found.  Assessment/Plan 1. Late onset Alzheimer's disease with behavioral disturbance ([place]) - cont memory care support - suspect she is advancing to late stage of disease - continues to have progressive weight loss with adjusted meal times - recommend hospice referral  2. Weight loss - same as above  3. Hypotension, unspecified hypotension type - average blood pressures > 120/80 - hold amlodipine if bp < 100/60   Family/ staff Communication: plans discussed with nurse  Labs/tests ordered: none

## 2020-10-18 DEATH — deceased

## 2020-12-18 ENCOUNTER — Ambulatory Visit: Payer: Medicare Other | Admitting: Physician Assistant
